# Patient Record
Sex: Female | Born: 2000 | Race: Black or African American | Hispanic: No | Marital: Single | Smoking: Never smoker
Health system: Southern US, Community
[De-identification: ages and names within clinical notes are randomized; demographics above are authoritative.]

## PROBLEM LIST (undated history)

## (undated) ENCOUNTER — Inpatient Hospital Stay (HOSPITAL_COMMUNITY): Payer: Self-pay

## (undated) DIAGNOSIS — J45909 Unspecified asthma, uncomplicated: Secondary | ICD-10-CM

## (undated) DIAGNOSIS — L709 Acne, unspecified: Secondary | ICD-10-CM

## (undated) DIAGNOSIS — A749 Chlamydial infection, unspecified: Secondary | ICD-10-CM

## (undated) DIAGNOSIS — Z8751 Personal history of pre-term labor: Secondary | ICD-10-CM

## (undated) HISTORY — PX: NO PAST SURGERIES: SHX2092

---
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
## 2018-01-01 ENCOUNTER — Other Ambulatory Visit: Payer: Self-pay | Admitting: Obstetrics and Gynecology

## 2018-01-01 DIAGNOSIS — A749 Chlamydial infection, unspecified: Secondary | ICD-10-CM

## 2018-01-01 DIAGNOSIS — O98811 Other maternal infectious and parasitic diseases complicating pregnancy, first trimester: Principal | ICD-10-CM

## 2018-01-01 DIAGNOSIS — O98819 Other maternal infectious and parasitic diseases complicating pregnancy, unspecified trimester: Secondary | ICD-10-CM

## 2018-01-01 DIAGNOSIS — O2341 Unspecified infection of urinary tract in pregnancy, first trimester: Principal | ICD-10-CM

## 2018-01-01 DIAGNOSIS — B951 Streptococcus, group B, as the cause of diseases classified elsewhere: Secondary | ICD-10-CM

## 2018-01-01 LAB — CULTURE, OB URINE
Culture: >=100000 — AB
SPECIAL REQUESTS: NORMAL

## 2018-01-01 LAB — GC/CHLAMYDIA PROBE AMP (~~LOC~~) NOT AT ARMC
Chlamydia: POSITIVE — AB
Neisseria Gonorrhea: NEGATIVE

## 2018-01-01 MED ORDER — CEPHALEXIN 500 MG PO CAPS: 500.0000 mg | ORAL_CAPSULE | Freq: Two times a day (BID) | ORAL | 0 refills | Status: DC

## 2018-01-01 MED ORDER — AZITHROMYCIN 250 MG PO TABS: 1000.0000 mg | ORAL_TABLET | Freq: Once | ORAL | 0 refills | Status: AC

## 2018-01-01 NOTE — Progress Notes (Signed)
+   chlamydia Patient called and notified Rx for Azithromycin 1000 mg PO Partner needs treatment, no sex X 7 days once he is treated Will need TOC   Rasch, Harolyn RutherfordJennifer I, NP 01/01/2018 4:53 PM

## 2018-01-01 NOTE — Progress Notes (Signed)
+   urine culture + GBS in urine  Rx: Keflex 500 mg PO x 7 days called to pharmacy Patient called and made aware.    Venia Carbonasch, Aarin Bluett I, NP 01/01/2018 2:05 PM

## 2018-01-02 ENCOUNTER — Telehealth: Payer: Self-pay | Admitting: Student

## 2018-01-02 DIAGNOSIS — B951 Streptococcus, group B, as the cause of diseases classified elsewhere: Secondary | ICD-10-CM

## 2018-01-02 DIAGNOSIS — O2341 Unspecified infection of urinary tract in pregnancy, first trimester: Principal | ICD-10-CM

## 2018-01-02 LAB — HSV CULTURE AND TYPING

## 2018-01-02 MED ORDER — AMOXICILLIN 500 MG PO CAPS: 500.0000 mg | ORAL_CAPSULE | Freq: Three times a day (TID) | ORAL | 0 refills | Status: AC

## 2018-01-02 NOTE — Telephone Encounter (Signed)
Verified by name & DOB.  Notified of change in abx. Needs amoxicillin for GBS UTI. Will d/c keflex.   Judeth HornLawrence, Markesha Hannig, NP

## 2018-01-22 ENCOUNTER — Encounter (HOSPITAL_COMMUNITY): Payer: Self-pay

## 2018-01-22 ENCOUNTER — Other Ambulatory Visit: Payer: Self-pay

## 2018-01-22 ENCOUNTER — Inpatient Hospital Stay (HOSPITAL_COMMUNITY)
Admission: AD | Admit: 2018-01-22 | Discharge: 2018-01-22 | Disposition: A | Discharge status: Home / Self Care | Payer: Medicaid Other | Source: Ambulatory Visit | Attending: Obstetrics & Gynecology | Admitting: Obstetrics & Gynecology

## 2018-01-22 DIAGNOSIS — O210 Mild hyperemesis gravidarum: Secondary | ICD-10-CM | POA: Insufficient documentation

## 2018-01-22 DIAGNOSIS — R109 Unspecified abdominal pain: Secondary | ICD-10-CM | POA: Diagnosis not present

## 2018-01-22 DIAGNOSIS — O219 Vomiting of pregnancy, unspecified: Secondary | ICD-10-CM

## 2018-01-22 DIAGNOSIS — Z3A11 11 weeks gestation of pregnancy: Secondary | ICD-10-CM | POA: Diagnosis not present

## 2018-01-22 DIAGNOSIS — O26891 Other specified pregnancy related conditions, first trimester: Secondary | ICD-10-CM

## 2018-01-22 LAB — COMPREHENSIVE METABOLIC PANEL
ALK PHOS: 73 U/L (ref 38–126)
ALT: 15 U/L (ref 0–44)
AST: 17 U/L (ref 15–41)
Albumin: 3.9 g/dL (ref 3.5–5.0)
Anion gap: 10 (ref 5–15)
BUN: 9 mg/dL (ref 6–20)
CALCIUM: 9.3 mg/dL (ref 8.9–10.3)
CHLORIDE: 100 mmol/L (ref 98–111)
CO2: 22 mmol/L (ref 22–32)
CREATININE: 0.55 mg/dL (ref 0.44–1.00)
GFR calc non Af Amer: >60 mL/min (ref >60)
Glucose, Bld: 104 mg/dL — ABNORMAL HIGH (ref 70–99)
Potassium: 3.4 mmol/L — ABNORMAL LOW (ref 3.5–5.1)
Sodium: 132 mmol/L — ABNORMAL LOW (ref 135–145)
Total Bilirubin: 1.8 mg/dL — ABNORMAL HIGH (ref 0.3–1.2)
Total Protein: 7.2 g/dL (ref 6.5–8.1)

## 2018-01-22 LAB — URINALYSIS, ROUTINE W REFLEX MICROSCOPIC
Bilirubin Urine: NEGATIVE
Glucose, UA: NEGATIVE mg/dL
HGB URINE DIPSTICK: NEGATIVE
Ketones, ur: 80 mg/dL — AB
NITRITE: NEGATIVE
PROTEIN: 30 mg/dL — AB
SPECIFIC GRAVITY, URINE: 1.035 — AB (ref 1.005–1.030)
pH: 5 (ref 5.0–8.0)

## 2018-01-22 MED ORDER — ONDANSETRON HCL 4 MG/2ML IJ SOLN
4.0000 mg | Freq: Once | INTRAMUSCULAR | Status: AC
  Administered 2018-01-22: 4 mg via INTRAVENOUS
  Filled 2018-01-22: qty 2

## 2018-01-22 MED ORDER — LACTATED RINGERS IV BOLUS
1000.0000 mL | Freq: Once | INTRAVENOUS | Status: AC
  Administered 2018-01-22: 1000 mL via INTRAVENOUS

## 2018-01-22 MED ORDER — ONDANSETRON 4 MG PO TBDP: 4.0000 mg | ORAL_TABLET | Freq: Three times a day (TID) | ORAL | 0 refills | Status: DC | PRN

## 2018-01-22 NOTE — MAU Provider Note (Signed)
History     CSN: [id number]  Arrival date and time: 01/22/18 1408   First Provider Initiated Contact with Patient 01/22/18 1513      Chief Complaint  Patient presents with  . Abdominal Pain  . Headache  . Emesis  . Nausea  . Dizziness   HPI  [name] is a 17 y.o. [id number] at 2539w1d who presents to MAU today with complaint of N/V and lower abdominal pain. The patient has been having these issues for many weeks. She was given Reglan which she has ran out of and didn't feel was working well anyway. She denies vaginal bleeding, UTI symptoms, diarrhea or fever. She has had constipation. She rates her pain at 5/10 and has not taken anything for pain.   OB History    Gravida  4   Para  2   Term  1   Preterm  1   AB  1   Living  2     SAB  1   TAB      Ectopic      Multiple  0   Live Births  2           Past Medical History:  Diagnosis Date  . Acne   . Asthma    when younger    Past Surgical History:  Procedure Laterality Date  . NO PAST SURGERIES      Family History  Problem Relation Age of Onset  . Anxiety disorder Mother   . Asthma Sister   . Asthma Brother     Social History   Tobacco Use  . Smoking status: Never Smoker  . Smokeless tobacco: Never Used  Substance Use Topics  . Alcohol use: No  . Drug use: No    Allergies: No Known Allergies  No medications prior to admission.    Review of Systems  Constitutional: Negative for fever.  Gastrointestinal: Positive for abdominal pain, constipation, nausea and vomiting. Negative for diarrhea.  Genitourinary: Negative for dysuria, frequency, urgency, vaginal bleeding and vaginal discharge.   Physical Exam   Blood pressure 106/72, pulse 87, temperature 98.3 F (36.8 C), temperature source Oral, resp. rate 16, weight 49.4 kg, last menstrual period 10/10/2017, SpO2 100 %, unknown if currently breastfeeding.  Physical Exam  Nursing note and vitals reviewed. Constitutional: She is  oriented to person, place, and time. She appears well-developed and well-nourished. No distress.  HENT:  Head: Normocephalic and atraumatic.  Cardiovascular: Tachycardia present.  Respiratory: Effort normal.  GI: Soft. She exhibits no distension and no mass. There is no tenderness. There is no rebound and no guarding.  Neurological: She is alert and oriented to person, place, and time.  Skin: Skin is warm and dry. No erythema.  Psychiatric: She has a normal mood and affect.   Results for orders placed or performed during the hospital encounter of 01/22/18 (from the past 24 hour(s))  Urinalysis, Routine w reflex microscopic     Status: Abnormal   Collection Time: 01/22/18  3:23 PM  Result Value Ref Range   Color, Urine AMBER (A) YELLOW   APPearance HAZY (A) CLEAR   Specific Gravity, Urine 1.035 (H) 1.005 - 1.030   pH 5.0 5.0 - 8.0   Glucose, UA NEGATIVE NEGATIVE mg/dL   Hgb urine dipstick NEGATIVE NEGATIVE   Bilirubin Urine NEGATIVE NEGATIVE   Ketones, ur 80 (A) NEGATIVE mg/dL   Protein, ur 30 (A) NEGATIVE mg/dL   Nitrite NEGATIVE NEGATIVE   Leukocytes, UA MODERATE (  A) NEGATIVE   RBC / HPF 0-5 0 - 5 RBC/hpf   WBC, UA 21-50 0 - 5 WBC/hpf   Bacteria, UA RARE (A) NONE SEEN   Squamous Epithelial / LPF 11-20 0 - 5   Mucus PRESENT   Comprehensive metabolic panel     Status: Abnormal   Collection Time: 01/22/18  4:11 PM  Result Value Ref Range   Sodium 132 (L) 135 - 145 mmol/L   Potassium 3.4 (L) 3.5 - 5.1 mmol/L   Chloride 100 98 - 111 mmol/L   CO2 22 22 - 32 mmol/L   Glucose, Bld 104 (H) 70 - 99 mg/dL   BUN 9 6 - 20 mg/dL   Creatinine, Ser 4.69 0.44 - 1.00 mg/dL   Calcium 9.3 8.9 - 62.9 mg/dL   Total Protein 7.2 6.5 - 8.1 g/dL   Albumin 3.9 3.5 - 5.0 g/dL   AST 17 15 - 41 U/L   ALT 15 0 - 44 U/L   Alkaline Phosphatase 73 38 - 126 U/L   Total Bilirubin 1.8 (H) 0.3 - 1.2 mg/dL   GFR calc non Af Amer >60 >60 mL/min   GFR calc Af Amer >60 >60 mL/min   Anion gap 10 5 - 15      MAU Course  Procedures Pt informed that the ultrasound is considered a limited OB ultrasound and is not intended to be a complete ultrasound exam.  Patient also informed that the ultrasound is not being completed with the intent of assessing for fetal or placental anomalies or any pelvic abnormalities.  Explained that the purpose of today's ultrasound is to assess for  viability.  Patient acknowledges the purpose of the exam and the limitations of the study.  +FHR noted. Normal rate. +FM.   MDM Unable to obtain FHR with Doppler. Bedside US performed.  UA today with significant dehydration IV LR bolus ordered with 4 mg Zofran and CMP today Patient requesting Zofran. Discussed risk vs benefits of Zofran in the first trimester and patient would prefer to have Zofran.  Patient reports resolution of nausea and no further emesis while in MAU. Able to tolerate PO while in MAU.  Assessment and Plan  A: SIUP at [redacted]w[redacted]d Nausea and vomiting in pregnancy prior to [redacted] weeks gestation  P: Discharge home Rx for Zofran sent to patient's pharmacy Diet for N/V in pregnancy included on AVS Patient advised to follow-up with [facility] as planned to start prenatal care  Patient may return to MAU as needed or if her condition were to change or worsen  Vonzella Nipple, PA-C 01/22/2018, 6:31 PM

## 2018-01-22 NOTE — Discharge Instructions (Signed)
Eating Plan for Hyperemesis Gravidarum °Hyperemesis gravidarum is a severe form of morning sickness. Because this condition causes severe nausea and vomiting, it can lead to dehydration, malnutrition, and weight loss. One way to lessen the symptoms of nausea and vomiting is to follow the eating plan for hyperemesis gravidarum. It is often used along with prescribed medicines to control your symptoms. °What can I do to relieve my symptoms? °Listen to your body. Everyone is different and has different preferences. Find what works best for you. Take any of the following actions that are helpful to you: °· Eat and drink slowly. °· Eat 5-6 small meals daily instead of 3 large meals. °· Eat crackers before you get out of bed in the morning. °· Try having a snack in the middle of the night. °· Starchy foods are usually tolerated well. Examples include cereal, toast, bread, potatoes, pasta, rice, and pretzels. °· Ginger may help with nausea. Add ¼ tsp ground ginger to hot tea or choose ginger tea. °· Try drinking 100% fruit juice or an electrolyte drink. An electrolyte drink contains sodium, potassium, and chloride. °· Continue to take your prenatal vitamins as told by your health care provider. If you are having trouble taking your prenatal vitamins, talk with your health care provider about different options. °· Include at least 1 serving of protein with your meals and snacks. Protein options include meats or poultry, beans, nuts, eggs, and yogurt. Try eating a protein-rich snack before bed. Examples of these snacks include cheese and crackers or half of a peanut butter or turkey sandwich. °· Consider eliminating foods that trigger your symptoms. These may include spicy foods, coffee, high-fat foods, very sweet foods, and acidic foods. °· Try meals that have more protein combined with bland, salty, lower-fat, and dry foods, such as nuts, seeds, pretzels, crackers, and cereal. °· Talk with your healthcare provider about  starting a supplement of vitamin B6. °· Have fluids that are cold, clear, and carbonated or sour. Examples include lemonade, ginger ale, lemon-lime soda, ice water, and sparkling water. °· Try lemon or mint tea. °· Try brushing your teeth or using a mouth rinse after meals. ° °What should I avoid to reduce my symptoms? °Avoiding some of the following things may help reduce your symptoms. °· Foods with strong smells. Try eating meals in well-ventilated areas that are free of odors. °· Drinking water or other beverages with meals. Try not to drink anything during the 30 minutes before and after your meals. °· Drinking more than 1 cup of fluid at a time. Sometimes using a straw helps. °· Fried or high-fat foods, such as butter and cream sauces. °· Spicy foods. °· Skipping meals as best as you can. Nausea can be more intense on an empty stomach. If you cannot tolerate food at that time, do not force it. Try sucking on ice chips or other frozen items, and make up for missed calories later. °· Lying down within 2 hours after eating. °· Environmental triggers. These may include smoky rooms, closed spaces, rooms with strong smells, warm or humid places, overly loud and noisy rooms, and rooms with motion or flickering lights. °· Quick and sudden changes in your movement. ° °This information is not intended to replace advice given to you by your health care provider. Make sure you discuss any questions you have with your health care provider. °Document Released: 02/16/2007 Document Revised: 12/19/2015 Document Reviewed: 11/20/2015 °Elsevier Interactive Patient Education © 2018 [employer] ° °

## 2018-01-22 NOTE — MAU Note (Signed)
For the past 2 days, hasn't been able to keep anything down.  Having pain in lower abd.  Has been constipated. Has been dizzy and bad headaches for the past wk.  Doesn't see dr until next week

## 2018-01-27 ENCOUNTER — Ambulatory Visit (INDEPENDENT_AMBULATORY_CARE_PROVIDER_SITE_OTHER): Payer: Medicaid Other | Admitting: Obstetrics

## 2018-01-27 ENCOUNTER — Encounter: Payer: Self-pay | Admitting: Obstetrics

## 2018-01-27 VITALS — BP 107/74 | HR 103 | Ht 61.0 in | Wt 111.3 lb

## 2018-01-27 DIAGNOSIS — B951 Streptococcus, group B, as the cause of diseases classified elsewhere: Secondary | ICD-10-CM

## 2018-01-27 DIAGNOSIS — O98811 Other maternal infectious and parasitic diseases complicating pregnancy, first trimester: Secondary | ICD-10-CM | POA: Diagnosis not present

## 2018-01-27 DIAGNOSIS — Z3490 Encounter for supervision of normal pregnancy, unspecified, unspecified trimester: Secondary | ICD-10-CM | POA: Diagnosis not present

## 2018-01-27 DIAGNOSIS — O99820 Streptococcus B carrier state complicating pregnancy: Secondary | ICD-10-CM

## 2018-01-27 DIAGNOSIS — A749 Chlamydial infection, unspecified: Secondary | ICD-10-CM

## 2018-01-27 DIAGNOSIS — O2341 Unspecified infection of urinary tract in pregnancy, first trimester: Secondary | ICD-10-CM | POA: Diagnosis not present

## 2018-01-27 DIAGNOSIS — Z3481 Encounter for supervision of other normal pregnancy, first trimester: Secondary | ICD-10-CM

## 2018-01-27 MED ORDER — CEFIXIME 400 MG PO CAPS: 400.0000 mg | ORAL_CAPSULE | Freq: Once | ORAL | 0 refills | Status: AC

## 2018-01-27 MED ORDER — AZITHROMYCIN 500 MG PO TABS: 1000.0000 mg | ORAL_TABLET | Freq: Once | ORAL | 0 refills | Status: AC

## 2018-01-27 NOTE — Progress Notes (Signed)
Pt states pregnancy was not planned recent. Recently tested + for CT took Rx as directed however pt states she did throw up Rx.  Pt was 135 lbs before pregnancy.

## 2018-01-27 NOTE — Progress Notes (Signed)
Subjective:    Amanda Davies is being seen today for her first obstetrical visit.  This is not a planned pregnancy. She is at [redacted]w[redacted]d gestation. Her obstetrical history is significant for none. Relationship with FOB: significant other, not living together. Patient does intend to breast feed. Pregnancy history fully reviewed.  The information documented in the HPI was reviewed and verified.  Menstrual History: OB History    Gravida  4   Para  2   Term  1   Preterm  1   AB  1   Living  2     SAB  1   TAB      Ectopic      Multiple  0   Live Births  2          Patient's last menstrual period was 10/10/2017.    Past Medical History:  Diagnosis Date  . Acne   . Asthma    when younger    Past Surgical History:  Procedure Laterality Date  . NO PAST SURGERIES       (Not in a hospital admission) No Known Allergies  Social History   Tobacco Use  . Smoking status: Never Smoker  . Smokeless tobacco: Never Used  Substance Use Topics  . Alcohol use: No    Family History  Problem Relation Age of Onset  . Anxiety disorder Mother   . Asthma Sister   . Asthma Brother      Review of Systems Constitutional: negative for weight loss Gastrointestinal: negative for vomiting Genitourinary:negative for genital lesions and vaginal discharge and dysuria Musculoskeletal:negative for back pain Behavioral/Psych: negative for abusive relationship, depression, illegal drug usage and tobacco use    Objective:    BP 107/74   Pulse (!) 103   Ht 5\' 1"  (1.549 m)   Wt 111 lb 4.8 oz (50.5 kg)   LMP 10/10/2017 Comment: Early June  BMI 21.03 kg/m  General Appearance:    Alert, cooperative, no distress, appears stated age  Head:    Normocephalic, without obvious abnormality, atraumatic  Eyes:    PERRL, conjunctiva/corneas clear, EOM's intact, fundi    benign, both eyes  Ears:    Normal TM's and external ear canals, both ears  Nose:   Nares normal, septum midline, mucosa  normal, no drainage    or sinus tenderness  Throat:   Lips, mucosa, and tongue normal; teeth and gums normal  Neck:   Supple, symmetrical, trachea midline, no adenopathy;    thyroid:  no enlargement/tenderness/nodules; no carotid   bruit or JVD  Back:     Symmetric, no curvature, ROM normal, no CVA tenderness  Lungs:     Clear to auscultation bilaterally, respirations unlabored  Chest Wall:    No tenderness or deformity   Heart:    Regular rate and rhythm, S1 and S2 normal, no murmur, rub   or gallop  Breast Exam:    No tenderness, masses, or nipple abnormality  Abdomen:     Soft, non-tender, bowel sounds active all four quadrants,    no masses, no organomegaly  Genitalia:    Normal female without lesion, discharge or tenderness  Extremities:   Extremities normal, atraumatic, no cyanosis or edema  Pulses:   2+ and symmetric all extremities  Skin:   Skin color, texture, turgor normal, no rashes or lesions  Lymph nodes:   Cervical, supraclavicular, and axillary nodes normal  Neurologic:   CNII-XII intact, normal strength, sensation and reflexes  throughout      Lab Review Urine pregnancy test Labs reviewed yes Radiologic studies reviewed yes  Assessment:    Pregnancy at [redacted]w[redacted]d weeks    Plan:     1. Encounter for supervision of normal pregnancy, antepartum, unspecified gravidity Rx: - Obstetric Panel, Including HIV - Culture, OB Urine - SMN1 COPY NUMBER ANALYSIS (SMA Carrier Screen) - Cystic Fibrosis Mutation 97 - Hemoglobinopathy evaluation - Genetic Screening  2. GBS (group b Streptococcus) UTI complicating pregnancy, first trimester - treated.  Treat in labor.  3. Chlamydia infection affecting pregnancy in first trimester Rx: - azithromycin (ZITHROMAX) 500 MG tablet; Take 2 tablets (1,000 mg total) by mouth once for 1 dose.  Dispense: 2 tablet; Refill: 0 - cefixime (SUPRAX) 400 MG CAPS capsule; Take 1 capsule (400 mg total) by mouth once for 1 dose.  Dispense: 1  capsule; Refill: 0  Prenatal vitamins.  Counseling provided regarding continued use of seat belts, cessation of alcohol consumption, smoking or use of illicit drugs; infection precautions i.e., influenza/TDAP immunizations, toxoplasmosis,CMV, parvovirus, listeria and varicella; workplace safety, exercise during pregnancy; routine dental care, safe medications, sexual activity, hot tubs, saunas, pools, travel, caffeine use, fish and methlymercury, potential toxins, hair treatments, varicose veins Weight gain recommendations per IOM guidelines reviewed: underweight/BMI< 18.5--> gain 28 - 40 lbs; normal weight/BMI 18.5 - 24.9--> gain 25 - 35 lbs; overweight/BMI 25 - 29.9--> gain 15 - 25 lbs; obese/BMI >30->gain  11 - 20 lbs Problem list reviewed and updated. FIRST/CF mutation testing/NIPT/QUAD SCREEN/fragile X/Ashkenazi Jewish population testing/Spinal muscular atrophy discussed: requested. Role of ultrasound in pregnancy discussed; fetal survey: requested. Amniocentesis discussed: not indicated.  Meds ordered this encounter  Medications  . azithromycin (ZITHROMAX) 500 MG tablet    Sig: Take 2 tablets (1,000 mg total) by mouth once for 1 dose.    Dispense:  2 tablet    Refill:  0  . cefixime (SUPRAX) 400 MG CAPS capsule    Sig: Take 1 capsule (400 mg total) by mouth once for 1 dose.    Dispense:  1 capsule    Refill:  0   Orders Placed This Encounter  Procedures  . Culture, OB Urine  . Obstetric Panel, Including HIV  . SMN1 COPY NUMBER ANALYSIS (SMA Carrier Screen)  . Cystic Fibrosis Mutation 97  . Hemoglobinopathy evaluation  . Genetic Screening    [facility]    Follow up in 4 weeks. 50% of 20 min visit spent on counseling and coordination of care.     Brock Bad MD

## 2018-01-29 LAB — CULTURE, OB URINE

## 2018-01-29 LAB — URINE CULTURE, OB REFLEX

## 2018-01-30 LAB — HEMOGLOBINOPATHY EVALUATION
HEMOGLOBIN A2 QUANTITATION: 1.9 % (ref 1.8–3.2)
HGB A: 98.1 % (ref 96.4–98.8)
HGB C: 0 %
HGB S: 0 %
HGB VARIANT: 0 %
Hemoglobin F Quantitation: 0 % (ref 0.0–2.0)

## 2018-01-30 LAB — OBSTETRIC PANEL, INCLUDING HIV
ANTIBODY SCREEN: NEGATIVE
BASOS: 0 %
Basophils Absolute: 0 10*3/uL (ref 0.0–0.2)
EOS (ABSOLUTE): 0.1 10*3/uL (ref 0.0–0.4)
EOS: 1 %
HEMATOCRIT: 42.5 % (ref 34.0–46.6)
HEP B S AG: NEGATIVE
HIV SCREEN 4TH GENERATION: NONREACTIVE
Hemoglobin: 13.7 g/dL (ref 11.1–15.9)
IMMATURE GRANS (ABS): 0 10*3/uL (ref 0.0–0.1)
Immature Granulocytes: 1 %
LYMPHS: 30 %
Lymphocytes Absolute: 1.8 10*3/uL (ref 0.7–3.1)
MCH: 25 pg — ABNORMAL LOW (ref 26.6–33.0)
MCHC: 32.2 g/dL (ref 31.5–35.7)
MCV: 77 fL — AB (ref 79–97)
MONOCYTES: 8 %
Monocytes Absolute: 0.5 10*3/uL (ref 0.1–0.9)
NEUTROS ABS: 3.6 10*3/uL (ref 1.4–7.0)
Neutrophils: 60 %
Platelets: 231 10*3/uL (ref 150–450)
RBC: 5.49 x10E6/uL — ABNORMAL HIGH (ref 3.77–5.28)
RDW: 13.8 % (ref 12.3–15.4)
RPR Ser Ql: NONREACTIVE
RUBELLA: 17.7 {index} (ref >0.99)
Rh Factor: POSITIVE
WBC: 6 10*3/uL (ref 3.4–10.8)

## 2018-02-01 ENCOUNTER — Telehealth: Payer: Self-pay

## 2018-02-01 NOTE — Telephone Encounter (Signed)
S/w patient and advised that [facility] results were not back yet.

## 2018-02-02 ENCOUNTER — Telehealth: Payer: Self-pay | Admitting: *Deleted

## 2018-02-02 NOTE — Telephone Encounter (Signed)
Pt called to office stating she was having some cramping. Would like call back.  Return call to pt. Pt states that she has been having some cramping, have ate quite of bit of pineapple and is concerned it may cause her to have SAB. Pt made aware that some mild cramping in pregnancy is normal. Pt advised that if she has ate a large amount of fruit it may cause upset stomach and increase bowel movements. Pt made aware that if she is having any thing other than mild cramps and/or bleeding she should be seen at [facility] for eval. Pt denies severe pain/bleeding at this time.

## 2018-02-03 LAB — CYSTIC FIBROSIS MUTATION 97: Interpretation: NOT DETECTED

## 2018-02-05 LAB — SMN1 COPY NUMBER ANALYSIS (SMA CARRIER SCREENING)

## 2018-02-24 ENCOUNTER — Encounter: Payer: Medicaid Other | Admitting: Obstetrics

## 2018-03-09 ENCOUNTER — Encounter: Payer: Self-pay | Admitting: Obstetrics

## 2018-03-26 ENCOUNTER — Encounter: Payer: Self-pay | Admitting: Obstetrics

## 2018-03-26 ENCOUNTER — Ambulatory Visit (INDEPENDENT_AMBULATORY_CARE_PROVIDER_SITE_OTHER): Payer: Medicaid Other | Admitting: Obstetrics

## 2018-03-26 VITALS — BP 112/71 | HR 94 | Wt 114.0 lb

## 2018-03-26 DIAGNOSIS — O0992 Supervision of high risk pregnancy, unspecified, second trimester: Secondary | ICD-10-CM

## 2018-03-26 DIAGNOSIS — O09892 Supervision of other high risk pregnancies, second trimester: Secondary | ICD-10-CM

## 2018-03-26 DIAGNOSIS — A749 Chlamydial infection, unspecified: Secondary | ICD-10-CM | POA: Diagnosis not present

## 2018-03-26 DIAGNOSIS — O09212 Supervision of pregnancy with history of pre-term labor, second trimester: Secondary | ICD-10-CM | POA: Diagnosis not present

## 2018-03-26 DIAGNOSIS — O0990 Supervision of high risk pregnancy, unspecified, unspecified trimester: Secondary | ICD-10-CM

## 2018-03-26 DIAGNOSIS — Z3A20 20 weeks gestation of pregnancy: Secondary | ICD-10-CM | POA: Diagnosis not present

## 2018-03-26 DIAGNOSIS — O09899 Supervision of other high risk pregnancies, unspecified trimester: Secondary | ICD-10-CM

## 2018-03-26 DIAGNOSIS — O98812 Other maternal infectious and parasitic diseases complicating pregnancy, second trimester: Secondary | ICD-10-CM | POA: Diagnosis not present

## 2018-03-26 DIAGNOSIS — O09219 Supervision of pregnancy with history of pre-term labor, unspecified trimester: Secondary | ICD-10-CM

## 2018-03-26 MED ORDER — CEFIXIME 400 MG PO CAPS: 400.0000 mg | ORAL_CAPSULE | Freq: Once | ORAL | 0 refills | Status: AC

## 2018-03-26 MED ORDER — AZITHROMYCIN 500 MG PO TABS: 1000.0000 mg | ORAL_TABLET | Freq: Once | ORAL | 0 refills | Status: AC

## 2018-03-26 NOTE — Progress Notes (Signed)
Pt has not been able to hold Rx for CT  Needs medication sent again .  Pt advised not to take Rx on empty stomach.  Pt states she fell recently after running fell on FM.

## 2018-03-26 NOTE — Progress Notes (Signed)
Subjective:  Amanda Davies is a 17 y.o. [id number] at 7748w1d being seen today for ongoing prenatal care.  She is currently monitored for the following issues for this high-risk pregnancy and has H/O preterm delivery, currently pregnant; Supervision of high risk pregnancy, antepartum; GBS (group b Streptococcus) UTI complicating pregnancy, first trimester; Chlamydia infection affecting pregnancy; and Supervision of normal pregnancy on their problem list.  Patient reports heartburn.   . Vag. Bleeding: None.  Movement: Present. Denies leaking of fluid.   The following portions of the patient's history were reviewed and updated as appropriate: allergies, current medications, past family history, past medical history, past social history, past surgical history and problem list. Problem list updated.  Objective:   Vitals:   03/26/18 1110  BP: 112/71  Pulse: 94  Weight: 114 lb (51.7 kg)    Fetal Status:     Movement: Present     General:  Alert, oriented and cooperative. Patient is in no acute distress.  Skin: Skin is warm and dry. No rash noted.   Cardiovascular: Normal heart rate noted  Respiratory: Normal respiratory effort, no problems with respiration noted  Abdomen: Soft, gravid, appropriate for gestational age. Pain/Pressure: Present     Pelvic:  Cervical exam deferred        Extremities: Normal range of motion.  Edema: None  Mental Status: Normal mood and affect. Normal behavior. Normal judgment and thought content.   Urinalysis:      Assessment and Plan:  Pregnancy: [id number] at 6948w1d  1. Supervision of high risk pregnancy, antepartum Rx: - US MFM OB COMP + 14 WK; Future  2. H/O preterm delivery, currently pregnant  3. Short interval between pregnancies affecting pregnancy, antepartum  4. Chlamydia infection affecting pregnancy in second trimester, antepartum Rx: - azithromycin (ZITHROMAX) 500 MG tablet; Take 2 tablets (1,000 mg total) by mouth once for 1 dose.  Dispense: 2  tablet; Refill: 0 - cefixime (SUPRAX) 400 MG CAPS capsule; Take 1 capsule (400 mg total) by mouth once for 1 dose.  Dispense: 1 capsule; Refill: 0   Preterm labor symptoms and general obstetric precautions including but not limited to vaginal bleeding, contractions, leaking of fluid and fetal movement were reviewed in detail with the patient. Please refer to After Visit Summary for other counseling recommendations.  Return in about 4 weeks (around 04/23/2018) for ROB.   Brock BadHarper, Charles A, MD

## 2018-03-29 ENCOUNTER — Other Ambulatory Visit: Payer: Self-pay | Admitting: Obstetrics

## 2018-03-29 ENCOUNTER — Telehealth: Payer: Self-pay

## 2018-03-29 ENCOUNTER — Encounter (HOSPITAL_COMMUNITY): Payer: Self-pay

## 2018-03-29 DIAGNOSIS — O98813 Other maternal infectious and parasitic diseases complicating pregnancy, third trimester: Principal | ICD-10-CM

## 2018-03-29 DIAGNOSIS — A749 Chlamydial infection, unspecified: Secondary | ICD-10-CM

## 2018-03-29 NOTE — Telephone Encounter (Signed)
Pt states she is nauseous and vomiting, refilled rx for zofran per dr. Clearance Cootsharper

## 2018-03-30 ENCOUNTER — Other Ambulatory Visit: Payer: Self-pay | Admitting: Obstetrics

## 2018-03-30 DIAGNOSIS — A749 Chlamydial infection, unspecified: Secondary | ICD-10-CM

## 2018-03-30 IMAGING — US US MFM OB FOLLOW-UP
1 series · 14 of 28 positions shown · non-contrast
Comparison: none

[Series 1: us mfm ob follow-up · 73 acquisitions, 14 frames shown]
[im 3/73]
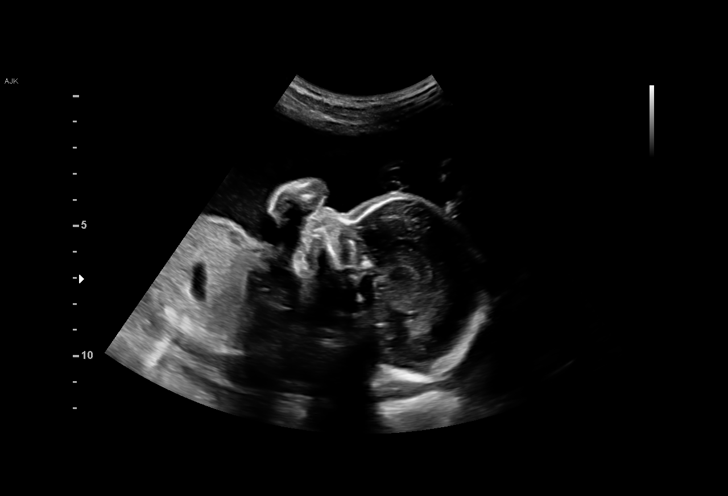
[im 9/73]
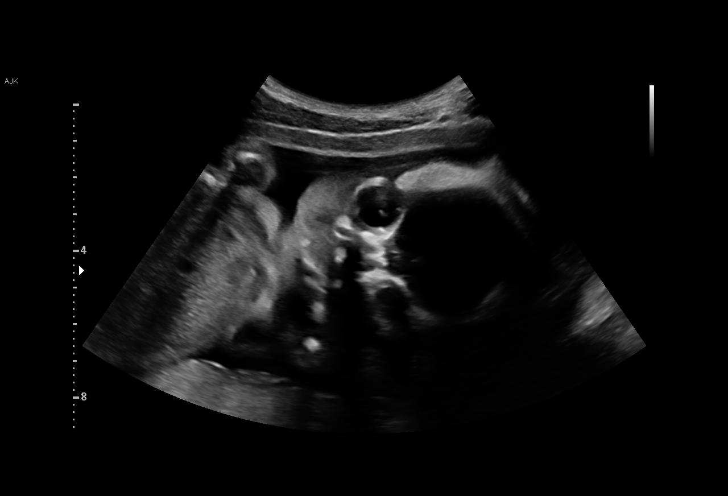
[im 14/73]
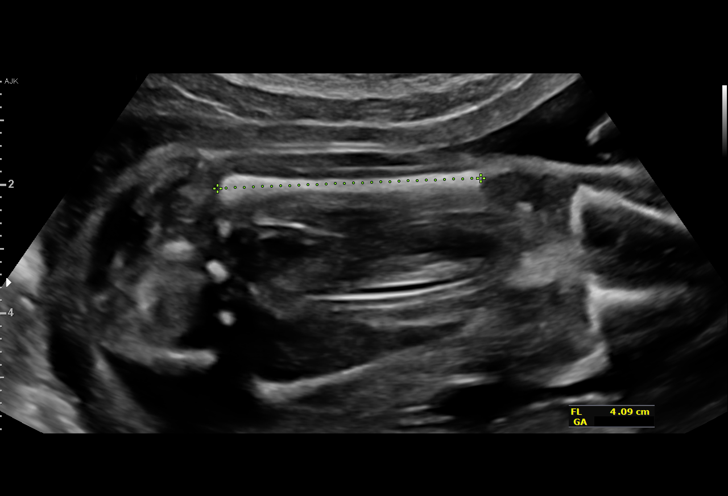
[im 19/73]
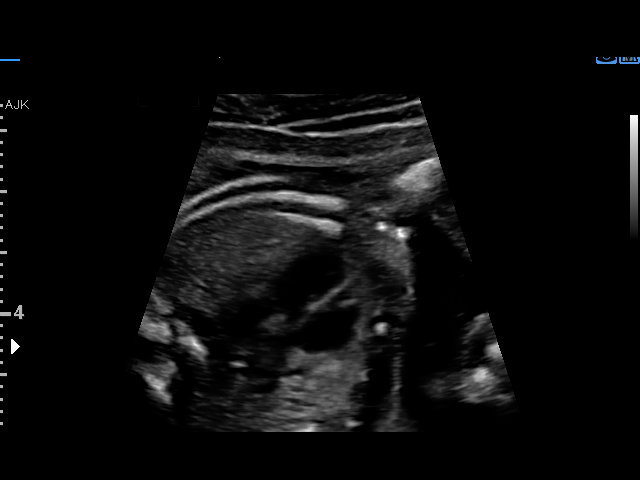
[im 25/73]
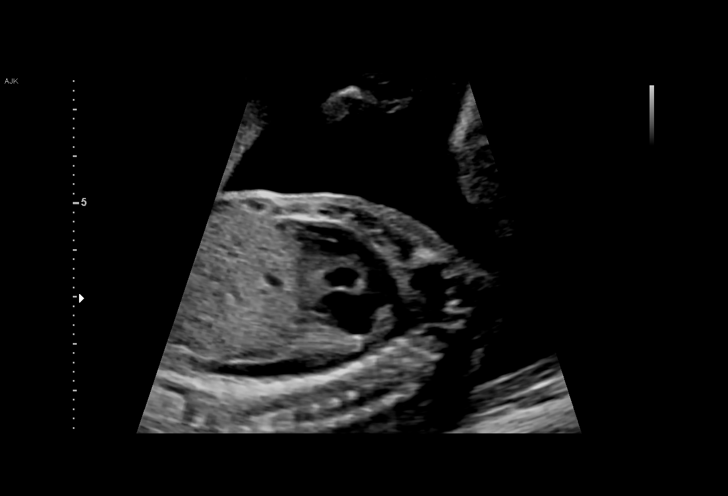
[im 30/73]
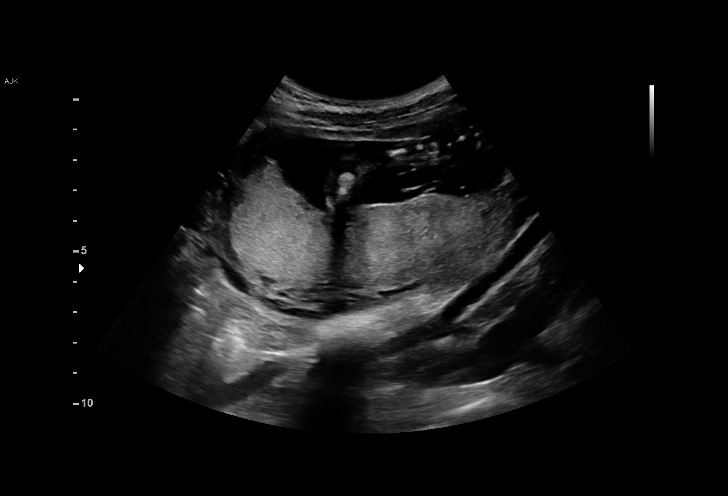
[im 35/73]
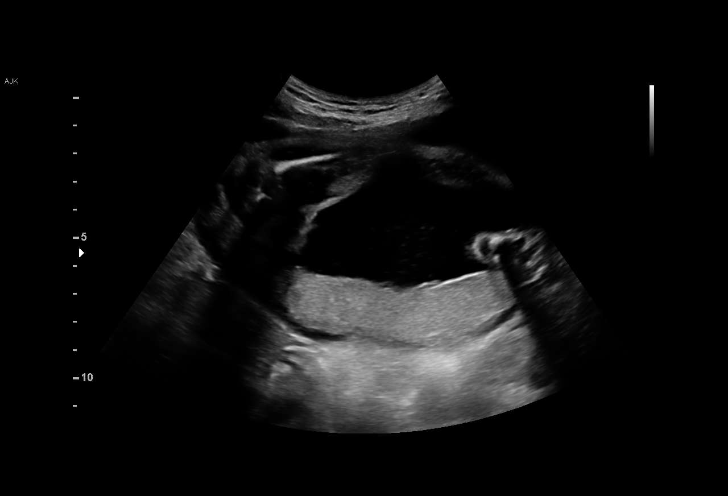
[im 41/73]
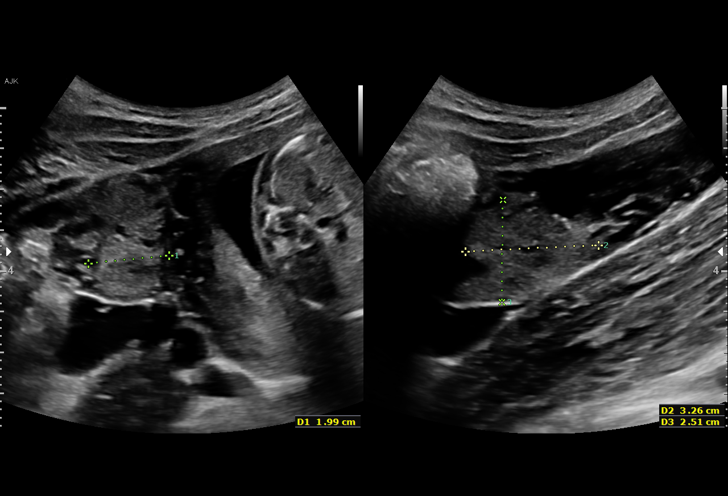
[im 46/73]
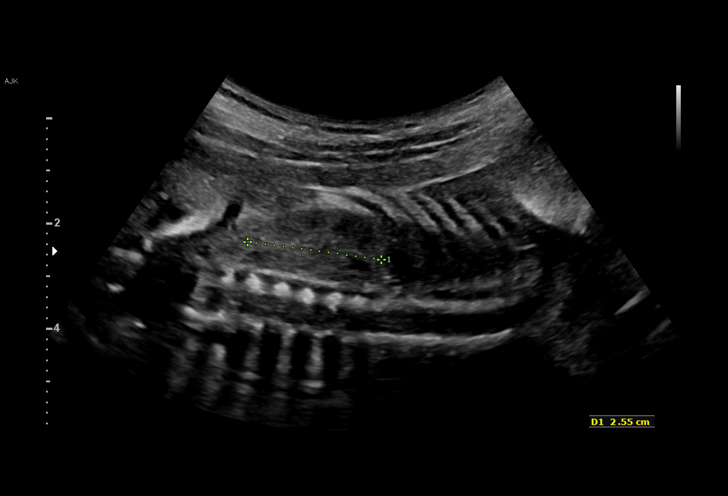
[im 51/73]
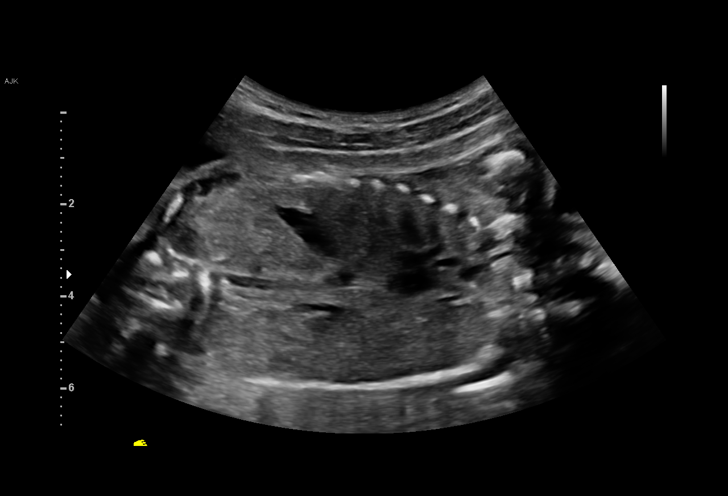
[im 57/73]
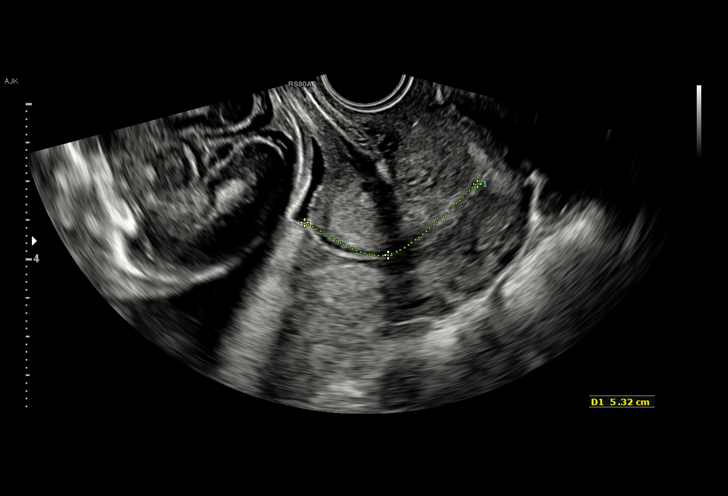
[im 62/73]
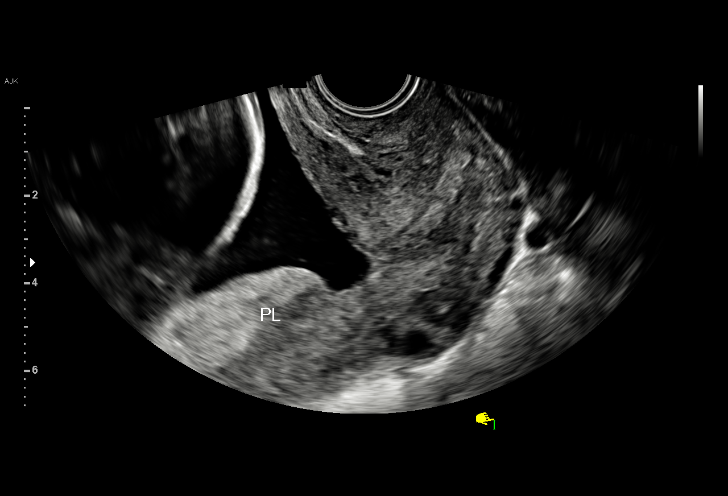
[im 67/73]
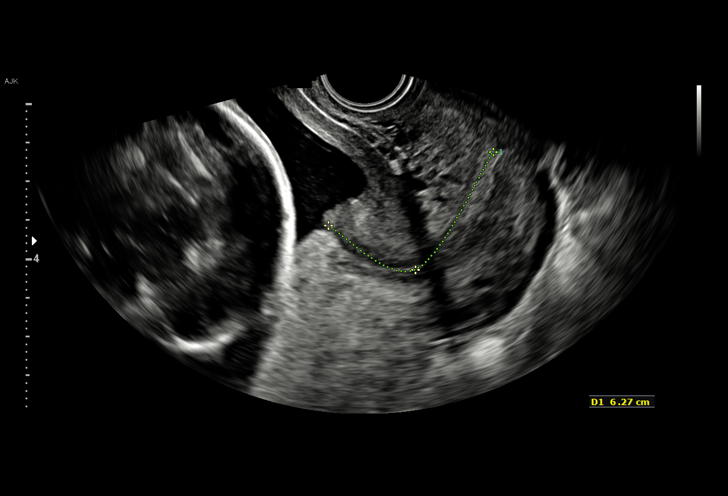
[im 73/73]
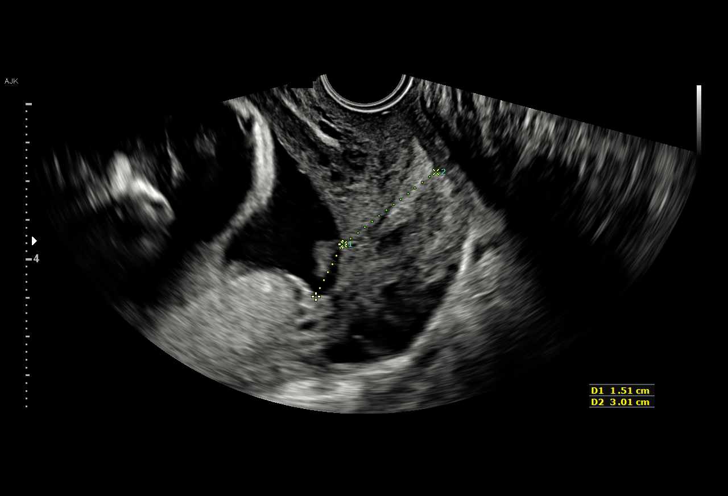

[14 of 28 positions shown; findings below may reference images not displayed]

Road [HOSPITAL]

1  [facility]              [id number]      [id number]     [id number]
2  JESUS ABRAHAM KROOS              [id number]      [id number]     [id number]
Indications

23 weeks gestation of pregnancy
Poor obstetric history: Previous preterm
delivery, antepartum (35 weeks, pt on 17p)
Encounter for other antenatal screening
follow-up
Encounter for cervical length
OB History

Gravidity:    3         Term:   0        Prem:   1        SAB:   0
TOP:          0       Ectopic:  0        Living: 1
Fetal Evaluation

Num Of Fetuses:     1
Fetal Heart         145
Rate(bpm):
Cardiac Activity:   Observed
Presentation:       Cephalic
Placenta:           Posterior, low-lying, 1.4 cm from int os
P. Cord Insertion:  Visualized, central

Amniotic Fluid
AFI FV:      Subjectively within normal limits

Largest Pocket(cm)
5.97
Biometry
BPD:      58.6  mm     G. Age:  24w 0d         60  %    CI:        75.85   %   70 - 86
FL/HC:      19.2   %   18.7 -
HC:      213.3  mm     G. Age:  23w 3d         27  %    HC/AC:      1.15       1.05 -
AC:      185.8  mm     G. Age:  23w 3d         36  %    FL/BPD:     70.0   %   71 - 87
FL:         41  mm     G. Age:  23w 2d         29  %    FL/AC:      22.1   %   20 - 24
CER:      25.8  mm     G. Age:  23w 6d         53  %
CM:        2.6  mm

Est. FW:     588  gm      1 lb 5 oz     49  %
Gestational Age

LMP:           23w 4d       Date:   07/27/16                 EDD:   05/03/17
U/S Today:     23w 4d                                        EDD:   05/03/17
Best:          23w 4d    Det. By:   LMP  (07/27/16)          EDD:   05/03/17
Anatomy

Cranium:               Appears normal         Aortic Arch:            Previously seen
Cavum:                 Appears normal         Ductal Arch:            Previously seen
Ventricles:            Appears normal         Diaphragm:              Appears normal
Choroid Plexus:        Previously seen        Stomach:                Appears normal, left
sided
Cerebellum:            Previously seen        Abdomen:                Appears normal
Posterior Fossa:       Appears normal         Abdominal Wall:         Previously seen
Nuchal Fold:           Not applicable (>20    Cord Vessels:           Previously seen
wks GA)
Face:                  Appears normal         Kidneys:                Appear normal
(orbits and profile)
Lips:                  Appears normal         Bladder:                Appears normal
Thoracic:              Appears normal         Spine:                  Previously seen
Heart:                 Appears normal         Upper Extremities:      Previously seen
(4CH, axis, and situs
RVOT:                  Appears normal         Lower Extremities:      Previously seen
LVOT:                  Appears normal

Other:  Fetus appears to be a female.Heels previously seen. Nasal bone
visualized.
Cervix Uterus Adnexa

Cervix
Length:           3.49  cm.
Normal appearance by transabdominal scan.

Uterus
No abnormality visualized.

Left Ovary
Within normal limits.

Right Ovary
Within normal limits.

Adnexa:       No abnormality visualized.
Impression

SIUP at 23+4 weeks
Normal interval anatomy; anatomic survey complete
Normal amniotic fluid volume
Appropriate interval growth with EFW at the 49th %tile
EV views of cervix: normal length without funneling; low-lying
posterior placenta
Recommendations

Follow-up ultrasound in 6 weeks to reassess placental
location

## 2018-03-30 MED ORDER — CEFIXIME 400 MG PO CAPS: 400.0000 mg | ORAL_CAPSULE | Freq: Once | ORAL | 0 refills | Status: AC

## 2018-04-06 ENCOUNTER — Ambulatory Visit (HOSPITAL_COMMUNITY)
Admission: RE | Admit: 2018-04-06 | Discharge: 2018-04-06 | Disposition: A | Discharge status: Home / Self Care | Payer: Medicaid Other | Source: Ambulatory Visit | Attending: Obstetrics | Admitting: Obstetrics

## 2018-04-06 DIAGNOSIS — Z3A21 21 weeks gestation of pregnancy: Secondary | ICD-10-CM

## 2018-04-06 DIAGNOSIS — Z363 Encounter for antenatal screening for malformations: Secondary | ICD-10-CM | POA: Diagnosis not present

## 2018-04-06 DIAGNOSIS — O0990 Supervision of high risk pregnancy, unspecified, unspecified trimester: Secondary | ICD-10-CM

## 2018-04-06 DIAGNOSIS — O09212 Supervision of pregnancy with history of pre-term labor, second trimester: Secondary | ICD-10-CM

## 2018-04-26 ENCOUNTER — Encounter: Payer: Medicaid Other | Admitting: Obstetrics

## 2018-04-30 DIAGNOSIS — H5213 Myopia, bilateral: Secondary | ICD-10-CM | POA: Diagnosis not present

## 2018-05-03 ENCOUNTER — Telehealth: Payer: Self-pay | Admitting: Obstetrics

## 2018-05-05 NOTE — L&D Delivery Note (Signed)
Delivery Note Amanda Davies is a 18 y.o. S8P1031 at [redacted]w[redacted]d admitted for SROM.  Labor course: uncomplicated ROM: 5h 65m with clear fluid  At 2016 a viable and healthy girl was delivered via spontaneous vaginal delivery (Presentation: cephalic; LOA).  Infant placed directly on mom's abdomen for bonding/skin-to-skin. Delayed cord clamping x , then cord clamped x 2, and cut by patient's mother. APGAR: 8,9 ; weight: pending at time of note.  40 units of pitocin diluted in 1000cc LR was infused rapidly IV per protocol. The placenta separated spontaneously and delivered via CCT and maternal pushing effort.  It was inspected and appears to be intact with a 3 VC.  Placenta/Cord with the following complications: none. Cord pH: n/a  Intrapartum complications:  None Anesthesia:  epidural Episiotomy: none Lacerations:  none Suture Repair: n/a Est. Blood Loss (mL): 50 Sponge and instrument count were correct x2.  Mom to postpartum.  Baby to Couplet care / Skin to Skin. Placenta to pathology. Plans to breast/bottlefeed Contraception: Depo Circ: n/a  Brand Males, SNM 07/09/2018 8:26 PM

## 2018-05-07 ENCOUNTER — Encounter: Payer: Self-pay | Admitting: Obstetrics

## 2018-05-12 DIAGNOSIS — H52223 Regular astigmatism, bilateral: Secondary | ICD-10-CM | POA: Diagnosis not present

## 2018-05-12 DIAGNOSIS — H5213 Myopia, bilateral: Secondary | ICD-10-CM | POA: Diagnosis not present

## 2018-06-11 ENCOUNTER — Telehealth: Payer: Self-pay

## 2018-06-11 ENCOUNTER — Other Ambulatory Visit: Payer: Medicaid Other

## 2018-06-11 ENCOUNTER — Encounter: Payer: Medicaid Other | Admitting: Obstetrics

## 2018-06-11 NOTE — Telephone Encounter (Signed)
Pt called wanting a refill for zofran and zithromax. I called pharmacy and confirmed that pt had picked up 2 rounds of zithromax that were previously sent. I advised patient that I was unable to send any refills at this time since she has not been seen since 11/19, she has no showed the past couple appointments. Pt verbalized understanding.

## 2018-06-15 ENCOUNTER — Telehealth: Payer: Self-pay | Admitting: Licensed Clinical Social Worker

## 2018-06-15 NOTE — Telephone Encounter (Signed)
Called pt for appt reminder. No answer. Left voicemail.

## 2018-06-17 ENCOUNTER — Ambulatory Visit (INDEPENDENT_AMBULATORY_CARE_PROVIDER_SITE_OTHER): Payer: Medicaid Other | Admitting: Obstetrics

## 2018-06-17 ENCOUNTER — Encounter: Payer: Self-pay | Admitting: Obstetrics

## 2018-06-17 VITALS — BP 101/60 | HR 97 | Wt 125.0 lb

## 2018-06-17 DIAGNOSIS — Z3A32 32 weeks gestation of pregnancy: Secondary | ICD-10-CM

## 2018-06-17 DIAGNOSIS — O98813 Other maternal infectious and parasitic diseases complicating pregnancy, third trimester: Secondary | ICD-10-CM

## 2018-06-17 DIAGNOSIS — A749 Chlamydial infection, unspecified: Secondary | ICD-10-CM

## 2018-06-17 DIAGNOSIS — Z3490 Encounter for supervision of normal pregnancy, unspecified, unspecified trimester: Secondary | ICD-10-CM

## 2018-06-17 DIAGNOSIS — O219 Vomiting of pregnancy, unspecified: Secondary | ICD-10-CM | POA: Diagnosis not present

## 2018-06-17 MED ORDER — ONDANSETRON 4 MG PO TBDP: ORAL_TABLET | ORAL | 2 refills | Status: DC

## 2018-06-17 MED ORDER — AZITHROMYCIN 500 MG PO TABS: ORAL_TABLET | ORAL | 0 refills | Status: DC

## 2018-06-17 NOTE — Progress Notes (Signed)
Subjective:  Amanda Davies is a 18 y.o. [id number] at [redacted]w[redacted]d being seen today for ongoing prenatal care.  She is currently monitored for the following issues for this high-risk pregnancy and has H/O preterm delivery, currently pregnant; Supervision of high risk pregnancy, antepartum; GBS (group b Streptococcus) UTI complicating pregnancy, first trimester; Chlamydia infection affecting pregnancy; and Supervision of normal pregnancy on their problem list.  Patient reports nausea.  Contractions: Not present. Vag. Bleeding: None.  Movement: Present. Denies leaking of fluid.   The following portions of the patient's history were reviewed and updated as appropriate: allergies, current medications, past family history, past medical history, past social history, past surgical history and problem list. Problem list updated.  Objective:   Vitals:   06/17/18 0926  BP: 101/60  Pulse: 97  Weight: 125 lb (56.7 kg)    Fetal Status:     Movement: Present     General:  Alert, oriented and cooperative. Patient is in no acute distress.  Skin: Skin is warm and dry. No rash noted.   Cardiovascular: Normal heart rate noted  Respiratory: Normal respiratory effort, no problems with respiration noted  Abdomen: Soft, gravid, appropriate for gestational age. Pain/Pressure: Present     Pelvic:  Cervical exam deferred        Extremities: Normal range of motion.  Edema: None  Mental Status: Normal mood and affect. Normal behavior. Normal judgment and thought content.   Urinalysis:      Assessment and Plan:  Pregnancy: [id number] at [redacted]w[redacted]d  1. Encounter for supervision of normal pregnancy, antepartum, unspecified gravidity  2. Chlamydia infection complicating pregnancy in third trimester Rx: - azithromycin (ZITHROMAX) 500 MG tablet; TAKE 2 TABLETS BY MOUTH ONCE  Dispense: 2 tablet; Refill: 0  3. Nausea and vomiting during pregnancy Rx: - ondansetron (ZOFRAN-ODT) 4 MG disintegrating tablet; TAKE 1 TABLET BY MOUTH  EVERY 8 HOURS AS NEEDED FOR NAUSEA OR VOMITING  Dispense: 30 tablet; Refill: 2  Term labor symptoms and general obstetric precautions including but not limited to vaginal bleeding, contractions, leaking of fluid and fetal movement were reviewed in detail with the patient. Please refer to After Visit Summary for other counseling recommendations.  Return in about 2 weeks (around 07/01/2018) for ROB.   Brock Bad, MD

## 2018-06-17 NOTE — Progress Notes (Signed)
Needs Refill on Nausea Medication. Pt states she has not been able to keep zithromax 250 mg down. Pt has been issues with getting Nausea Medication from the pharmacy.  Pt will not be able to to 2 hr GT today pt  She had snickers candy bar.

## 2018-06-25 ENCOUNTER — Observation Stay (HOSPITAL_COMMUNITY)
Admission: AD | Admit: 2018-06-25 | Discharge: 2018-06-28 | Disposition: A | Discharge status: Home / Self Care | Payer: Medicaid Other | Attending: Obstetrics & Gynecology | Admitting: Obstetrics & Gynecology

## 2018-06-25 ENCOUNTER — Encounter (HOSPITAL_COMMUNITY): Payer: Self-pay

## 2018-06-25 ENCOUNTER — Other Ambulatory Visit: Payer: Self-pay

## 2018-06-25 DIAGNOSIS — Z3A33 33 weeks gestation of pregnancy: Secondary | ICD-10-CM | POA: Diagnosis not present

## 2018-06-25 DIAGNOSIS — A749 Chlamydial infection, unspecified: Secondary | ICD-10-CM | POA: Diagnosis not present

## 2018-06-25 DIAGNOSIS — Z79899 Other long term (current) drug therapy: Secondary | ICD-10-CM | POA: Diagnosis not present

## 2018-06-25 DIAGNOSIS — O2341 Unspecified infection of urinary tract in pregnancy, first trimester: Secondary | ICD-10-CM

## 2018-06-25 DIAGNOSIS — Z3490 Encounter for supervision of normal pregnancy, unspecified, unspecified trimester: Secondary | ICD-10-CM

## 2018-06-25 DIAGNOSIS — N939 Abnormal uterine and vaginal bleeding, unspecified: Secondary | ICD-10-CM

## 2018-06-25 DIAGNOSIS — O4700 False labor before 37 completed weeks of gestation, unspecified trimester: Secondary | ICD-10-CM | POA: Diagnosis present

## 2018-06-25 DIAGNOSIS — O98811 Other maternal infectious and parasitic diseases complicating pregnancy, first trimester: Secondary | ICD-10-CM

## 2018-06-25 DIAGNOSIS — B951 Streptococcus, group B, as the cause of diseases classified elsewhere: Secondary | ICD-10-CM

## 2018-06-25 HISTORY — DX: Chlamydial infection, unspecified: A74.9

## 2018-06-25 LAB — URINALYSIS, MICROSCOPIC (REFLEX)

## 2018-06-25 LAB — URINALYSIS, ROUTINE W REFLEX MICROSCOPIC
Bilirubin Urine: NEGATIVE
Glucose, UA: NEGATIVE mg/dL
KETONES UR: 15 mg/dL — AB
Nitrite: NEGATIVE
Protein, ur: NEGATIVE mg/dL
Specific Gravity, Urine: 1.02 (ref 1.005–1.030)
pH: 7.5 (ref 5.0–8.0)

## 2018-06-25 LAB — CBC
HCT: 35.7 % — ABNORMAL LOW (ref 36.0–46.0)
Hemoglobin: 11.3 g/dL — ABNORMAL LOW (ref 12.0–15.0)
MCH: 24.6 pg — AB (ref 26.0–34.0)
MCHC: 31.7 g/dL (ref 30.0–36.0)
MCV: 77.8 fL — ABNORMAL LOW (ref 80.0–100.0)
Platelets: 199 10*3/uL (ref 150–400)
RBC: 4.59 MIL/uL (ref 3.87–5.11)
RDW: 14.1 % (ref 11.5–15.5)
WBC: 8.7 10*3/uL (ref 4.0–10.5)
nRBC: 0 % (ref 0.0–0.2)

## 2018-06-25 LAB — RAPID URINE DRUG SCREEN, HOSP PERFORMED
Amphetamines: NOT DETECTED
Barbiturates: NOT DETECTED
Benzodiazepines: NOT DETECTED
Cocaine: NOT DETECTED
OPIATES: NOT DETECTED
Tetrahydrocannabinol: NOT DETECTED

## 2018-06-25 LAB — TYPE AND SCREEN
ABO/RH(D): O POS
Antibody Screen: NEGATIVE

## 2018-06-25 LAB — CREATININE, SERUM
Creatinine, Ser: 0.49 mg/dL (ref 0.44–1.00)
GFR calc Af Amer: >60 mL/min (ref >60)
GFR calc non Af Amer: >60 mL/min (ref >60)

## 2018-06-25 MED ORDER — BETAMETHASONE SOD PHOS & ACET 6 (3-3) MG/ML IJ SUSP
12.0000 mg | INTRAMUSCULAR | Status: AC
  Administered 2018-06-25 – 2018-06-26 (×2): 12 mg via INTRAMUSCULAR
  Filled 2018-06-25 (×2): qty 2

## 2018-06-25 MED ORDER — DOCUSATE SODIUM 100 MG PO CAPS
100.0000 mg | ORAL_CAPSULE | Freq: Every day | ORAL | Status: DC
  Administered 2018-06-26 – 2018-06-28 (×3): 100 mg via ORAL
  Filled 2018-06-25 (×3): qty 1

## 2018-06-25 MED ORDER — ENOXAPARIN SODIUM 40 MG/0.4ML ~~LOC~~ SOLN
40.0000 mg | SUBCUTANEOUS | Status: DC
  Administered 2018-06-25 – 2018-06-27 (×3): 40 mg via SUBCUTANEOUS
  Filled 2018-06-25 (×3): qty 0.4

## 2018-06-25 MED ORDER — AZITHROMYCIN 250 MG PO TABS
1000.0000 mg | ORAL_TABLET | Freq: Once | ORAL | Status: AC
  Administered 2018-06-25: 1000 mg via ORAL
  Filled 2018-06-25: qty 4

## 2018-06-25 MED ORDER — CALCIUM CARBONATE ANTACID 500 MG PO CHEW: 2.0000 | CHEWABLE_TABLET | ORAL | Status: DC | PRN

## 2018-06-25 MED ORDER — PRENATAL MULTIVITAMIN CH
1.0000 | ORAL_TABLET | Freq: Every day | ORAL | Status: DC
  Administered 2018-06-26 – 2018-06-27 (×2): 1 via ORAL
  Filled 2018-06-25 (×3): qty 1

## 2018-06-25 MED ORDER — PENICILLIN G 3 MILLION UNITS IVPB - SIMPLE MED
3.0000 10*6.[IU] | INTRAVENOUS | Status: DC
  Administered 2018-06-26 (×5): 3 10*6.[IU] via INTRAVENOUS
  Filled 2018-06-25: qty 100
  Filled 2018-06-25: qty 3
  Filled 2018-06-25: qty 100
  Filled 2018-06-25: qty 3
  Filled 2018-06-25 (×2): qty 100
  Filled 2018-06-25: qty 3
  Filled 2018-06-25: qty 100

## 2018-06-25 MED ORDER — MAGNESIUM SULFATE BOLUS VIA INFUSION
5.0000 g | Freq: Once | INTRAVENOUS | Status: AC
  Administered 2018-06-25: 5 g via INTRAVENOUS
  Filled 2018-06-25: qty 500

## 2018-06-25 MED ORDER — ACETAMINOPHEN 325 MG PO TABS: 650.0000 mg | ORAL_TABLET | ORAL | Status: DC | PRN

## 2018-06-25 MED ORDER — MAGNESIUM SULFATE 40 G IN LACTATED RINGERS - SIMPLE
2.0000 g/h | INTRAVENOUS | Status: AC
  Administered 2018-06-26: 2 g/h via INTRAVENOUS
  Filled 2018-06-25 (×2): qty 500

## 2018-06-25 MED ORDER — SODIUM CHLORIDE 0.9 % IV SOLN
5.0000 10*6.[IU] | Freq: Once | INTRAVENOUS | Status: AC
  Administered 2018-06-25: 5 10*6.[IU] via INTRAVENOUS
  Filled 2018-06-25: qty 5

## 2018-06-25 MED ORDER — ONDANSETRON 4 MG PO TBDP
4.0000 mg | ORAL_TABLET | Freq: Once | ORAL | Status: DC
  Filled 2018-06-25: qty 1

## 2018-06-25 MED ORDER — LACTATED RINGERS IV SOLN
INTRAVENOUS | Status: AC
  Administered 2018-06-25 – 2018-06-26 (×3): via INTRAVENOUS

## 2018-06-25 NOTE — H&P (Signed)
OBSTETRIC ADMISSION HISTORY AND PHYSICAL  Amanda Davies is a 18 y.o. female [phone] with IUP at 3568w1d by L/8 presenting for contractions, bloody show. States had intercourse last night for the first time in awhile. Woke up early this morning with contractions, went to bathroom and had some blood streaking the tissue when she wiped. Contractions eased off for a bit earlier today than have picked back up in the last few hours. Feels like they are every 5 minutes, lasting about 30 seconds. Has noticed more bloody, mucous-like discharge this afternoon. Had some in underwear when went to bathroom.   Reports fetal movement. Denies abnormal discharge, leakage of fluids.   She received her prenatal [facility] at [facility] - though has had limited care (3 visits)  Ultrasounds . 8w0: viability U/S . 21w5: anatomy U/S, anterior placenta, normal interval growth, no abnormalities noted  Prenatal History/Complications: . Limited prenatal care . History of preterm delivery at 35 weeks  . GBS bacteriuria . Chlamydia - diagnosed in November 2019, had not yet taken treatment as of 06/25/18 - vomited first dose and not repeated because lost prescription   Past Medical History: Past Medical History:  Diagnosis Date  . Acne   . Asthma    when younger  . Chlamydia     Past Surgical History: Past Surgical History:  Procedure Laterality Date  . NO PAST SURGERIES      Obstetrical History: OB History    Gravida  4   Para  2   Term  1   Preterm  1   AB  1   Living  2     SAB  1   TAB      Ectopic      Multiple  0   Live Births  2           Social History: Social History   Socioeconomic History  . Marital status: Single    Spouse name: Not on file  . Number of children: 2  . Years of education: 3712  . Highest education level: High school graduate  Occupational History  . Not on file  Social Needs  . Financial resource strain: Not on file  . Food insecurity:    Worry: Not on file     Inability: Not on file  . Transportation needs:    Medical: Not on file    Non-medical: Not on file  Tobacco Use  . Smoking status: Never Smoker  . Smokeless tobacco: Never Used  Substance and Sexual Activity  . Alcohol use: No  . Drug use: No  . Sexual activity: Not Currently    Partners: Male    Birth control/protection: None  Lifestyle  . Physical activity:    Days per week: Not on file    Minutes per session: Not on file  . Stress: Not on file  Relationships  . Social connections:    Talks on phone: Not on file    Gets together: Not on file    Attends religious service: Not on file    Active member of club or organization: Not on file    Attends meetings of clubs or organizations: Not on file    Relationship status: Not on file  Other Topics Concern  . Not on file  Social History Narrative  . Not on file    Family History: Family History  Problem Relation Age of Onset  . Anxiety disorder Mother   . Asthma Sister   . Asthma Brother  Allergies: No Known Allergies  Medications Prior to Admission  Medication Sig Dispense Refill Last Dose  . azithromycin (ZITHROMAX) 250 MG tablet TK 4 TS PO ONCE FOR 1 DOSE  0 Not Taking  . azithromycin (ZITHROMAX) 500 MG tablet TAKE 2 TABLETS BY MOUTH ONCE 2 tablet 0   . metoCLOPramide (REGLAN) 10 MG tablet Take 1 tablet (10 mg total) by mouth 4 (four) times daily -  before meals and at bedtime. (Patient not taking: Reported on 01/27/2018) 30 tablet 0 Not Taking  . ondansetron (ZOFRAN-ODT) 4 MG disintegrating tablet TAKE 1 TABLET BY MOUTH EVERY 8 HOURS AS NEEDED FOR NAUSEA OR VOMITING 30 tablet 0   . ondansetron (ZOFRAN-ODT) 4 MG disintegrating tablet TAKE 1 TABLET BY MOUTH EVERY 8 HOURS AS NEEDED FOR NAUSEA OR VOMITING 30 tablet 2   . Prenatal MV-Min-FA-Omega-3 (PRENATAL GUMMIES/DHA & FA) 0.4-32.5 MG CHEW Chew 1 each by mouth daily. (Patient not taking: Reported on 01/27/2018) 30 tablet 1 Not Taking  . Prenatal Vit-Fe  Phos-FA-Omega (VITAFOL GUMMIES) 3.33-0.333-34.8 MG CHEW CHEW 3 TABLETS BY MOUTH AT BEDTIME (Patient not taking: Reported on 06/17/2018) 90 tablet 11 Not Taking  . terconazole (TERAZOL 7) 0.4 % vaginal cream Place 1 applicator vaginally at bedtime. (Patient not taking: Reported on 01/27/2018) 45 g 0 Not Taking     Review of Systems  All systems reviewed and negative except as stated in HPI  Blood pressure (!) 90/43, pulse 88, temperature 98.5 F (36.9 C), resp. rate 16, last menstrual period 10/10/2017, SpO2 98 %, unknown if currently breastfeeding. General appearance: alert, well-appearing, mildly uncomfortable during contractions Lungs: no respiratory distress Heart: regular rate  Abdomen: soft, non-tender; gravid Pelvic: deferred  Extremities: no LE edema  Presentation: cephalic by BSUS, sutures  Fetal monitoring: 130s/mod/+a/-d Uterine activity: irregular Dilation: 4 Effacement (%): 70 Station: -1 Exam by:: L Earlene Plater DO  Prenatal labs: ABO, Rh: --/--/O POS (02/21 1821) Antibody: NEG (02/21 1821) Rubella: 17.70 (09/25 1638) RPR: Non Reactive (09/25 1638)  HBsAg: Negative (09/25 1638)  HIV: Non Reactive (09/25 1638)  GBS:   bacteriuria Glucola: not done Genetic screening:  Low risk NIPS (female)  Prenatal Transfer Tool  Maternal Diabetes: no screening Genetic Screening: Normal Maternal Ultrasounds/Referrals: Normal Fetal Ultrasounds or other Referrals:  None Maternal Substance Abuse:  Unknown, UDS pending at time of admission Significant Maternal Medications:  None Significant Maternal Lab Results: None  Results for orders placed or performed during the hospital encounter of 06/25/18 (from the past 24 hour(s))  Urinalysis, Routine w reflex microscopic   Collection Time: 06/25/18  5:51 PM  Result Value Ref Range   Color, Urine YELLOW YELLOW   APPearance CLEAR CLEAR   Specific Gravity, Urine 1.020 1.005 - 1.030   pH 7.5 5.0 - 8.0   Glucose, UA NEGATIVE NEGATIVE mg/dL    Hgb urine dipstick SMALL (A) NEGATIVE   Bilirubin Urine NEGATIVE NEGATIVE   Ketones, ur 15 (A) NEGATIVE mg/dL   Protein, ur NEGATIVE NEGATIVE mg/dL   Nitrite NEGATIVE NEGATIVE   Leukocytes,Ua SMALL (A) NEGATIVE  Urinalysis, Microscopic (reflex)   Collection Time: 06/25/18  5:51 PM  Result Value Ref Range   RBC / HPF 11-20 0 - 5 RBC/hpf   WBC, UA 11-20 0 - 5 WBC/hpf   Bacteria, UA FEW (A) NONE SEEN   Squamous Epithelial / LPF 11-20 0 - 5  Urine rapid drug screen (hosp performed)   Collection Time: 06/25/18  5:51 PM  Result Value Ref Range   Opiates  NONE DETECTED NONE DETECTED   Cocaine NONE DETECTED NONE DETECTED   Benzodiazepines NONE DETECTED NONE DETECTED   Amphetamines NONE DETECTED NONE DETECTED   Tetrahydrocannabinol NONE DETECTED NONE DETECTED   Barbiturates NONE DETECTED NONE DETECTED  CBC   Collection Time: 06/25/18  6:21 PM  Result Value Ref Range   WBC 8.7 4.0 - 10.5 K/uL   RBC 4.59 3.87 - 5.11 MIL/uL   Hemoglobin 11.3 (L) 12.0 - 15.0 g/dL   HCT 41.4 (L) 23.9 - 53.2 %   MCV 77.8 (L) 80.0 - 100.0 fL   MCH 24.6 (L) 26.0 - 34.0 pg   MCHC 31.7 30.0 - 36.0 g/dL   RDW 02.3 34.3 - 56.8 %   Platelets 199 150 - 400 K/uL   nRBC 0.0 0.0 - 0.2 %  Type and screen [facility]    Collection Time: 06/25/18  6:21 PM  Result Value Ref Range   ABO/RH(D) O POS    Antibody Screen NEG    Sample Expiration      06/28/2018 Performed at [facility], [address] [place], [place] [address]     Patient Active Problem List   Diagnosis Date Noted  . Supervision of normal pregnancy 01/27/2018  . GBS (group b Streptococcus) UTI complicating pregnancy, first trimester 01/01/2018  . Chlamydia infection affecting pregnancy 01/01/2018  . Supervision of high risk pregnancy, antepartum 11/27/2016  . H/O preterm delivery, currently pregnant 01/01/2016    Assessment/Plan:  Stanisha Schacher is a 18 y.o. [phone] at [redacted]w[redacted]d here for threatened preterm labor presenting  with contractions and bloody show, found to have BBOW on exam.   Threatened Preterm Labor: Monitored in MAU for about 2 hours with no cervical change - 4/70/-1, still BBOW. No signs of active labor, so will admit to antepartum.  -- Mg++ started for neuroprotection/tocolysis  -- continue mIVF  Fetal Wellbeing: EFW 5lbs by Leopold's. Cephalic by sutures, confirmed by BSUS.  -- GBS (positive) - PCN -- category I - will do BID NST  -- BMZ x 2 q24hrs  -- NICU consulted - discussed with MD over the phone   Untreated Chlamydia: Azithromycin 1g given in MAU.   Cristal Deer. Earlene Plater, DO OB/GYN Fellow

## 2018-06-25 NOTE — MAU Note (Addendum)
Pt having ctx and bloody show. Feeling ctx every 2-3 minutes since this morning. No LOF +FM. Pain is 7/10. Just found her Rx for Chlamydia but has not taken it yet.

## 2018-06-26 DIAGNOSIS — Z3A33 33 weeks gestation of pregnancy: Secondary | ICD-10-CM | POA: Diagnosis not present

## 2018-06-26 DIAGNOSIS — O479 False labor, unspecified: Secondary | ICD-10-CM

## 2018-06-26 LAB — RPR: RPR Ser Ql: NONREACTIVE

## 2018-06-26 MED ORDER — NIFEDIPINE ER OSMOTIC RELEASE 30 MG PO TB24
30.0000 mg | ORAL_TABLET | Freq: Every day | ORAL | Status: DC
  Administered 2018-06-26 – 2018-06-28 (×3): 30 mg via ORAL
  Filled 2018-06-26 (×3): qty 1

## 2018-06-26 MED ORDER — ALUM & MAG HYDROXIDE-SIMETH 200-200-20 MG/5ML PO SUSP
30.0000 mL | ORAL | Status: DC | PRN
  Administered 2018-06-26: 30 mL via ORAL
  Filled 2018-06-26 (×2): qty 30

## 2018-06-26 MED ORDER — ONDANSETRON 4 MG PO TBDP
4.0000 mg | ORAL_TABLET | Freq: Four times a day (QID) | ORAL | Status: DC | PRN
  Administered 2018-06-26 – 2018-06-28 (×2): 4 mg via ORAL
  Filled 2018-06-26 (×4): qty 1

## 2018-06-26 NOTE — Progress Notes (Signed)
Patient ID: Amanda Davies, female   DOB: November 19, 2000, 18 y.o.   MRN: [id number] FACULTY PRACTICE ANTEPARTUM(COMPREHENSIVE) NOTE  Amanda Davies is a 18 y.o. [id number] with Estimated Date of Delivery: 08/12/18   By   [redacted]w[redacted]d  who is admitted for Preterm labor.    Fetal presentation is cephalic. Length of Stay:  0  Days  Date of admission:06/25/2018  Subjective: No contractions Patient reports the fetal movement as active. Patient reports uterine contraction  activity as none. Patient reports  vaginal bleeding as none. Patient describes fluid per vagina as None.  Vitals:  Blood pressure (!) 97/53, pulse 94, temperature 98.3 F (36.8 C), temperature source Oral, resp. rate 16, height 5\' 1"  (1.549 m), weight 54.8 kg, last menstrual period 10/10/2017, SpO2 98 %, unknown if currently breastfeeding. Vitals:   06/26/18 0359 06/26/18 0500 06/26/18 0600 06/26/18 0658  BP: (!) 97/53     Pulse: 94     Resp: 15 16 16 16   Temp: 98.3 F (36.8 C)     TempSrc: Oral     SpO2: 100% 99% 98% 98%  Weight:      Height:       Physical Examination:  General appearance - alert, well appearing, and in no distress Abdomen - soft, nontender, nondistended, no masses or organomegaly Fundal Height:  size equals dates Pelvic Exam:  examination not indicated Cervical Exam: Not evaluated. and found to be / / and fetal presentation is . Extremities: extremities normal, atraumatic, no cyanosis or edema with DTRs 2+ bilaterally Membranes:intact  Fetal Monitoring:  Reactive NST     Labs:  Results for orders placed or performed during the hospital encounter of 06/25/18 (from the past 24 hour(s))  Urinalysis, Routine w reflex microscopic   Collection Time: 06/25/18  5:51 PM  Result Value Ref Range   Color, Urine YELLOW YELLOW   APPearance CLEAR CLEAR   Specific Gravity, Urine 1.020 1.005 - 1.030   pH 7.5 5.0 - 8.0   Glucose, UA NEGATIVE NEGATIVE mg/dL   Hgb urine dipstick SMALL (A) NEGATIVE   Bilirubin Urine NEGATIVE  NEGATIVE   Ketones, ur 15 (A) NEGATIVE mg/dL   Protein, ur NEGATIVE NEGATIVE mg/dL   Nitrite NEGATIVE NEGATIVE   Leukocytes,Ua SMALL (A) NEGATIVE  Urinalysis, Microscopic (reflex)   Collection Time: 06/25/18  5:51 PM  Result Value Ref Range   RBC / HPF 11-20 0 - 5 RBC/hpf   WBC, UA 11-20 0 - 5 WBC/hpf   Bacteria, UA FEW (A) NONE SEEN   Squamous Epithelial / LPF 11-20 0 - 5  Urine rapid drug screen (hosp performed)   Collection Time: 06/25/18  5:51 PM  Result Value Ref Range   Opiates NONE DETECTED NONE DETECTED   Cocaine NONE DETECTED NONE DETECTED   Benzodiazepines NONE DETECTED NONE DETECTED   Amphetamines NONE DETECTED NONE DETECTED   Tetrahydrocannabinol NONE DETECTED NONE DETECTED   Barbiturates NONE DETECTED NONE DETECTED  CBC   Collection Time: 06/25/18  6:21 PM  Result Value Ref Range   WBC 8.7 4.0 - 10.5 K/uL   RBC 4.59 3.87 - 5.11 MIL/uL   Hemoglobin 11.3 (L) 12.0 - 15.0 g/dL   HCT 56.3 (L) 87.5 - 64.3 %   MCV 77.8 (L) 80.0 - 100.0 fL   MCH 24.6 (L) 26.0 - 34.0 pg   MCHC 31.7 30.0 - 36.0 g/dL   RDW 32.9 51.8 - 84.1 %   Platelets 199 150 - 400 K/uL   nRBC 0.0 0.0 - 0.2 %  RPR   Collection Time: 06/25/18  6:21 PM  Result Value Ref Range   RPR Ser Ql Non Reactive Non Reactive  Creatinine, serum   Collection Time: 06/25/18  6:21 PM  Result Value Ref Range   Creatinine, Ser 0.49 0.44 - 1.00 mg/dL   GFR calc non Af Amer >60 >60 mL/min   GFR calc Af Amer >60 >60 mL/min  Type and screen [facility] [place]   Collection Time: 06/25/18  6:21 PM  Result Value Ref Range   ABO/RH(D) O POS    Antibody Screen NEG    Sample Expiration      06/28/2018 Performed at [facility], [address], [place], [place] [address]     Imaging Studies:      Medications:  Scheduled . betamethasone acetate-betamethasone sodium phosphate  12 mg Intramuscular Q24 Hr x 2  . docusate sodium  100 mg Oral Daily  . enoxaparin (LOVENOX) injection  40 mg Subcutaneous  Q24H  . pencillin G potassium IV  3 Million Units Intravenous Q4H  . prenatal multivitamin  1 tablet Oral Q1200   I have reviewed the patient's current medications.  ASSESSMENT: [id number] [redacted]w[redacted]d Estimated Date of Delivery: 08/12/18  Patient Active Problem List   Diagnosis Date Noted  . Threatened preterm labor 06/25/2018  . Supervision of normal pregnancy 01/27/2018  . GBS (group b Streptococcus) UTI complicating pregnancy, first trimester 01/01/2018  . Chlamydia infection affecting pregnancy 01/01/2018  . Supervision of high risk pregnancy, antepartum 11/27/2016  . H/O preterm delivery, currently pregnant 01/01/2016    PLAN: >finish up magnesium this evening >2nd BMZ this evening >begin oral procardia today >consider discharge in am if stable  [employer] 06/26/2018,7:36 AM

## 2018-06-26 NOTE — Consult Note (Signed)
Asked by Dr Despina Hidden to provide counseling to Amanda Davies regarding outlook for preterm infant at 37 weeks.  Brief Hx: 33 weeks in PTL. She has limited PNC. Pregnancy is complicated by GBS bacteriuria and untreated Chlamydia. She is currently on BMZ, magnesium sulfate, and  Pen G. She previously delivered at 35 weeks, infant was in the NICU and required some gavage feedings.   I spoke to Amanda Vereb in her room and dicussed expectations at 33-34 weeks. I discussed common morbidities at this age range including resp insufficiency requiring O2, immature feeding ability requiring IV fluids and gavage feeding. I discussed temp support and LOS. She was attentive and pleasant through the conversation.  Thank you for this consult.  Lucillie Garfinkel MD Neonatologist

## 2018-06-27 LAB — ABO/RH: ABO/RH(D): O POS

## 2018-06-27 NOTE — Progress Notes (Signed)
Report call to Olegario Messier, [place]

## 2018-06-27 NOTE — Progress Notes (Signed)
Patient ID: Amanda Davies, female   DOB: [birth year], 18 y.o.   MRN: [id number] FACULTY PRACTICE ANTEPARTUM(COMPREHENSIVE) NOTE  Amanda Davies is a 18 y.o. [id number] at [redacted]w[redacted]d by best clinical estimate who is admitted for Preterm labor.   Fetal presentation is cephalic. Length of Stay:  0  Days  Subjective:  Patient reports the fetal movement as active. Patient reports uterine contraction  activity as rare. Patient reports  vaginal bleeding as scant staining. Patient describes fluid per vagina as None.  Vitals:  Blood pressure 95/61, pulse (!) 104, temperature 98 F (36.7 C), temperature source Oral, resp. rate 17, height 5\' 1"  (1.549 m), weight 54.8 kg, last menstrual period 10/10/2017, SpO2 100 %, unknown if currently breastfeeding. Physical Examination:  General appearance - alert, well appearing, and in no distress Heart - normal rate and regular rhythm Abdomen - soft, nontender, nondistended Fundal Height:  size equals dates Cervical Exam: Not evaluated.  Extremities: extremities normal, atraumatic, no cyanosis or edema  Membranes:intact  Fetal Monitoring:  Baseline: 120 bpm, Variability: Good {> 6 bpm), Accelerations: Reactive and Decelerations: Absent  Labs:  No results found for this or any previous visit (from the past 24 hour(s)).   Medications:  Scheduled . docusate sodium  100 mg Oral Daily  . enoxaparin (LOVENOX) injection  40 mg Subcutaneous Q24H  . NIFEdipine  30 mg Oral Daily  . prenatal multivitamin  1 tablet Oral Q1200   I have reviewed the patient's current medications.  ASSESSMENT: Patient Active Problem List   Diagnosis Date Noted  . Threatened preterm labor 06/25/2018  . Supervision of normal pregnancy 01/27/2018  . GBS (group b Streptococcus) UTI complicating pregnancy, first trimester 01/01/2018  . Chlamydia infection affecting pregnancy 01/01/2018  . Supervision of high risk pregnancy, antepartum 11/27/2016  . H/O preterm delivery, currently pregnant  01/01/2016    PLAN: Observe for PTL off Magnesium. She will be transported to [facility]  Scheryl Darter 06/27/2018,4:33 AM

## 2018-06-28 ENCOUNTER — Encounter (HOSPITAL_COMMUNITY): Payer: Self-pay | Admitting: *Deleted

## 2018-06-28 LAB — TYPE AND SCREEN
ABO/RH(D): O POS
Antibody Screen: NEGATIVE

## 2018-06-28 LAB — GC/CHLAMYDIA PROBE AMP (~~LOC~~) NOT AT ARMC
Chlamydia: NEGATIVE
NEISSERIA GONORRHEA: NEGATIVE

## 2018-06-28 MED ORDER — NIFEDIPINE ER 30 MG PO TB24: 30.0000 mg | ORAL_TABLET | Freq: Every day | ORAL | 1 refills | Status: DC

## 2018-06-28 NOTE — Progress Notes (Signed)
Pt discharged to home with mother and significant other.  Condition stable.  Pt stated that she would return to hospital if she began feeling contractions again or if she suspects that her water has broken.  She knows to attend her scheduled OB appointment at [facility] on Feb 27.  Pt to car via wheelchair with Dolphus Jenny, NT.  No equipment for home ordered at discharge.

## 2018-06-28 NOTE — Discharge Summary (Signed)
Physician Discharge Summary  Patient ID: Amanda Davies MRN: [id number] DOB/AGE: [birth year] 18 y.o.  Admit date: 06/25/2018 Discharge date: 06/28/2018  Admission Diagnoses: PTL  Discharge Diagnoses:  Active Problems:   Threatened preterm labor   Discharged Condition: stable  Hospital Course: admitted with cervical exam 4 cm and BBOW Underwent magnesium sulfate tocolysis and betamethasone therapy No uterine activity 48 hours off magnesium  Consults:   Significant Diagnostic Studies:   Treatments: magnesium and BMZ x 2  Discharge Exam: Blood pressure (!) 97/57, pulse 79, temperature 98.2 F (36.8 C), temperature source Oral, resp. rate 15, height 5\' 1"  (1.549 m), weight 54.8 kg, last menstrual period 10/10/2017, SpO2 99 %, unknown if currently breastfeeding. General appearance: alert, cooperative and no distress GI: soft, non-tender; bowel sounds normal; no masses,  no organomegaly  Disposition: Discharge disposition: 01-Home or Self Care       Discharge Instructions    Call MD for:   Complete by:  As directed    Rupture of membranes or regular contractions   Diet - low sodium heart healthy   Complete by:  As directed    Increase activity slowly   Complete by:  As directed    Lifting restrictions   Complete by:  As directed    No lifting more than 10 pounds   Sexual Activity Restrictions   Complete by:  As directed    No sex      Follow-up Information    [facility] Follow up on 07/02/2018.   Why:  ob appt Contact information: [address] [place] [address] [phone]          Signed: Lazaro Arms 06/28/2018, 7:43 AM

## 2018-06-28 NOTE — Discharge Instructions (Signed)
Premature Rupture and Preterm Premature Rupture of Membranes ° °A sac made up of membranes surrounds your baby in the womb (uterus). Rupture of membranes is when this sac breaks open. This is also known as your "water breaking." When this sac breaks before labor starts, it is called premature rupture of membranes (PROM). If this happens before 37 weeks of being pregnant, it is called preterm premature rupture of membranes (PPROM). PPROM is serious. It needs medical care right away. °What increases the risk of PPROM? °PPROM is more likely to happen in women who: °· Have an infection. °· Have had PPROM before. °· Have a cervix that is short. °· Have bleeding during the second or third trimester. °· Have a low BMI. This is a measure of body fat. °· Smoke. °· Use drugs. °· Have a low socioeconomic status. °What problems can be caused by PROM and PPROM? °This condition creates health dangers for the mother and the baby. These include: °· Giving birth to the baby too early (prematurely). °· Getting a serious infection of the placenta (chorioamnionitis). °· Having the placenta detach from the uterus early (placental abruption). °· Squeezing of the umbilical cord. °· Getting a serious infection after delivery. °What are the signs of PROM and PPROM? °· A sudden gush of fluid from the vagina. °· A slow leak of fluid from the vagina. °· Your underwear is wet. °What should I do if I think my water broke? °Call your doctor right away. You will need to go to the hospital to get checked right away. °What happens if I am told that I have PROM or PPROM? °You will have tests done at the hospital. °· If you have PROM, you may be given medicine to start labor (be induced). This may be done if you are not having contractions during the 24 hours after your water broke. °· If you have PPROM and are not having contractions, you may be given medicine to start labor. It will depend on how far along you are in your pregnancy. °If you have  PPROM: °· You and your baby will be watched closely to see if you have infections or other problems. °· You may be given: °? An antibiotic medicine. This can stop an infection from starting. °? A steroid medicine. This can help your baby's lungs develop faster. °? A medicine to help prevent cerebral palsy in your baby. °? A medicine to stop early labor (preterm labor). °· You may be told to stay in bed except to use the bathroom (bed rest). °· You may be given medicine to start labor. This may be done if there are problems with you or the baby. °Your treatment will depend on many factors. °Contact a doctor if: °· Your water breaks and you are not having contractions. °Get help right away if: °· Your water breaks before you are [redacted] weeks pregnant. °Summary °· When your water breaks before labor starts, it is called premature rupture of membranes (PROM). °· When PROM happens before 37 weeks of pregnancy, it is called preterm premature rupture of membranes (PPROM). °· If you are not having contractions, your labor may be started for you. °This information is not intended to replace advice given to you by your health care provider. Make sure you discuss any questions you have with your health care provider. °Document Released: 07/18/2008 Document Revised: 12/05/2016 Document Reviewed: 01/10/2016 °Elsevier Interactive Patient Education © 2019 [employer] ° °

## 2018-07-01 ENCOUNTER — Encounter: Payer: Self-pay | Admitting: Obstetrics

## 2018-07-01 ENCOUNTER — Encounter: Payer: Medicaid Other | Admitting: Obstetrics

## 2018-07-02 ENCOUNTER — Encounter: Payer: Medicaid Other | Admitting: Obstetrics

## 2018-07-09 ENCOUNTER — Other Ambulatory Visit: Payer: Self-pay

## 2018-07-09 ENCOUNTER — Inpatient Hospital Stay (HOSPITAL_COMMUNITY)
Admission: AD | Admit: 2018-07-09 | Discharge: 2018-07-11 | DRG: 807 | Disposition: A | Discharge status: Home / Self Care | Payer: Medicaid Other | Attending: Obstetrics & Gynecology | Admitting: Obstetrics & Gynecology

## 2018-07-09 ENCOUNTER — Telehealth: Payer: Self-pay

## 2018-07-09 ENCOUNTER — Inpatient Hospital Stay (HOSPITAL_COMMUNITY): Payer: Medicaid Other | Admitting: Anesthesiology

## 2018-07-09 ENCOUNTER — Encounter (HOSPITAL_COMMUNITY): Payer: Self-pay

## 2018-07-09 DIAGNOSIS — Z3689 Encounter for other specified antenatal screening: Secondary | ICD-10-CM

## 2018-07-09 DIAGNOSIS — O4703 False labor before 37 completed weeks of gestation, third trimester: Secondary | ICD-10-CM

## 2018-07-09 DIAGNOSIS — R110 Nausea: Secondary | ICD-10-CM | POA: Diagnosis not present

## 2018-07-09 DIAGNOSIS — O99824 Streptococcus B carrier state complicating childbirth: Secondary | ICD-10-CM | POA: Diagnosis not present

## 2018-07-09 DIAGNOSIS — O42919 Preterm premature rupture of membranes, unspecified as to length of time between rupture and onset of labor, unspecified trimester: Secondary | ICD-10-CM | POA: Diagnosis present

## 2018-07-09 DIAGNOSIS — O98819 Other maternal infectious and parasitic diseases complicating pregnancy, unspecified trimester: Secondary | ICD-10-CM

## 2018-07-09 DIAGNOSIS — R1111 Vomiting without nausea: Secondary | ICD-10-CM | POA: Diagnosis not present

## 2018-07-09 DIAGNOSIS — Z3A35 35 weeks gestation of pregnancy: Secondary | ICD-10-CM | POA: Diagnosis not present

## 2018-07-09 DIAGNOSIS — H547 Unspecified visual loss: Secondary | ICD-10-CM

## 2018-07-09 DIAGNOSIS — O98811 Other maternal infectious and parasitic diseases complicating pregnancy, first trimester: Secondary | ICD-10-CM

## 2018-07-09 DIAGNOSIS — O42013 Preterm premature rupture of membranes, onset of labor within 24 hours of rupture, third trimester: Secondary | ICD-10-CM | POA: Diagnosis not present

## 2018-07-09 DIAGNOSIS — Z3490 Encounter for supervision of normal pregnancy, unspecified, unspecified trimester: Secondary | ICD-10-CM

## 2018-07-09 DIAGNOSIS — O2341 Unspecified infection of urinary tract in pregnancy, first trimester: Secondary | ICD-10-CM

## 2018-07-09 DIAGNOSIS — E161 Other hypoglycemia: Secondary | ICD-10-CM | POA: Diagnosis not present

## 2018-07-09 DIAGNOSIS — O42913 Preterm premature rupture of membranes, unspecified as to length of time between rupture and onset of labor, third trimester: Principal | ICD-10-CM | POA: Diagnosis present

## 2018-07-09 DIAGNOSIS — O219 Vomiting of pregnancy, unspecified: Secondary | ICD-10-CM

## 2018-07-09 DIAGNOSIS — Z3A00 Weeks of gestation of pregnancy not specified: Secondary | ICD-10-CM | POA: Diagnosis not present

## 2018-07-09 DIAGNOSIS — O09899 Supervision of other high risk pregnancies, unspecified trimester: Secondary | ICD-10-CM

## 2018-07-09 DIAGNOSIS — B951 Streptococcus, group B, as the cause of diseases classified elsewhere: Secondary | ICD-10-CM

## 2018-07-09 DIAGNOSIS — E162 Hypoglycemia, unspecified: Secondary | ICD-10-CM | POA: Diagnosis not present

## 2018-07-09 DIAGNOSIS — A749 Chlamydial infection, unspecified: Secondary | ICD-10-CM | POA: Diagnosis present

## 2018-07-09 DIAGNOSIS — H538 Other visual disturbances: Secondary | ICD-10-CM | POA: Diagnosis not present

## 2018-07-09 DIAGNOSIS — O09219 Supervision of pregnancy with history of pre-term labor, unspecified trimester: Secondary | ICD-10-CM

## 2018-07-09 HISTORY — DX: Personal history of pre-term labor: Z87.51

## 2018-07-09 LAB — COMPREHENSIVE METABOLIC PANEL
ALT: 13 U/L (ref 0–44)
ANION GAP: 8 (ref 5–15)
AST: 17 U/L (ref 15–41)
Albumin: 2.9 g/dL — ABNORMAL LOW (ref 3.5–5.0)
Alkaline Phosphatase: 161 U/L — ABNORMAL HIGH (ref 38–126)
BUN: 6 mg/dL (ref 6–20)
CO2: 23 mmol/L (ref 22–32)
Calcium: 8.8 mg/dL — ABNORMAL LOW (ref 8.9–10.3)
Chloride: 104 mmol/L (ref 98–111)
Creatinine, Ser: 0.61 mg/dL (ref 0.44–1.00)
GFR calc Af Amer: >60 mL/min (ref >60)
GFR calc non Af Amer: >60 mL/min (ref >60)
Glucose, Bld: 78 mg/dL (ref 70–99)
Potassium: 3.2 mmol/L — ABNORMAL LOW (ref 3.5–5.1)
Sodium: 135 mmol/L (ref 135–145)
Total Bilirubin: 0.7 mg/dL (ref 0.3–1.2)
Total Protein: 6.3 g/dL — ABNORMAL LOW (ref 6.5–8.1)

## 2018-07-09 LAB — TYPE AND SCREEN
ABO/RH(D): O POS
Antibody Screen: NEGATIVE

## 2018-07-09 LAB — CBC
HCT: 38.3 % (ref 36.0–46.0)
Hemoglobin: 11.7 g/dL — ABNORMAL LOW (ref 12.0–15.0)
MCH: 23.7 pg — ABNORMAL LOW (ref 26.0–34.0)
MCHC: 30.5 g/dL (ref 30.0–36.0)
MCV: 77.5 fL — ABNORMAL LOW (ref 80.0–100.0)
PLATELETS: 186 10*3/uL (ref 150–400)
RBC: 4.94 MIL/uL (ref 3.87–5.11)
RDW: 13.9 % (ref 11.5–15.5)
WBC: 9.6 10*3/uL (ref 4.0–10.5)
nRBC: 0 % (ref 0.0–0.2)

## 2018-07-09 LAB — URINALYSIS, ROUTINE W REFLEX MICROSCOPIC
BILIRUBIN URINE: NEGATIVE
Glucose, UA: NEGATIVE mg/dL
Hgb urine dipstick: NEGATIVE
Ketones, ur: 20 mg/dL — AB
Nitrite: NEGATIVE
Protein, ur: NEGATIVE mg/dL
Specific Gravity, Urine: 1.017 (ref 1.005–1.030)
pH: 8 (ref 5.0–8.0)

## 2018-07-09 LAB — PROTEIN / CREATININE RATIO, URINE
Creatinine, Urine: 128.7 mg/dL
Protein Creatinine Ratio: 0.09 mg/mg{Cre} (ref 0.00–0.15)
Total Protein, Urine: 12 mg/dL

## 2018-07-09 MED ORDER — OXYCODONE-ACETAMINOPHEN 5-325 MG PO TABS: 1.0000 | ORAL_TABLET | ORAL | Status: DC | PRN

## 2018-07-09 MED ORDER — FENTANYL CITRATE (PF) 100 MCG/2ML IJ SOLN: 100.0000 ug | INTRAMUSCULAR | Status: DC | PRN

## 2018-07-09 MED ORDER — DIPHENHYDRAMINE HCL 50 MG/ML IJ SOLN: 12.5000 mg | INTRAMUSCULAR | Status: DC | PRN

## 2018-07-09 MED ORDER — SOD CITRATE-CITRIC ACID 500-334 MG/5ML PO SOLN: 30.0000 mL | ORAL | Status: DC | PRN

## 2018-07-09 MED ORDER — FLEET ENEMA 7-19 GM/118ML RE ENEM: 1.0000 | ENEMA | RECTAL | Status: DC | PRN

## 2018-07-09 MED ORDER — ACETAMINOPHEN 325 MG PO TABS: 650.0000 mg | ORAL_TABLET | ORAL | Status: DC | PRN

## 2018-07-09 MED ORDER — ONDANSETRON HCL 4 MG/2ML IJ SOLN
4.0000 mg | Freq: Four times a day (QID) | INTRAMUSCULAR | Status: DC | PRN
  Administered 2018-07-09: 4 mg via INTRAVENOUS
  Filled 2018-07-09: qty 2

## 2018-07-09 MED ORDER — PENICILLIN G 3 MILLION UNITS IVPB - SIMPLE MED: 3.0000 10*6.[IU] | INTRAVENOUS | Status: DC

## 2018-07-09 MED ORDER — SODIUM CHLORIDE (PF) 0.9 % IJ SOLN
INTRAMUSCULAR | Status: DC | PRN
  Administered 2018-07-09: 12 mL/h via EPIDURAL

## 2018-07-09 MED ORDER — OXYTOCIN BOLUS FROM INFUSION
500.0000 mL | Freq: Once | INTRAVENOUS | Status: AC
  Administered 2018-07-09: 500 mL/h via INTRAVENOUS

## 2018-07-09 MED ORDER — ONDANSETRON HCL 4 MG PO TABS: 4.0000 mg | ORAL_TABLET | Freq: Three times a day (TID) | ORAL | 0 refills | Status: DC | PRN

## 2018-07-09 MED ORDER — LIDOCAINE HCL (PF) 1 % IJ SOLN
INTRAMUSCULAR | Status: DC | PRN
  Administered 2018-07-09: 7 mL via EPIDURAL
  Administered 2018-07-09: 4 mL via EPIDURAL

## 2018-07-09 MED ORDER — FENTANYL-BUPIVACAINE-NACL 0.5-0.125-0.9 MG/250ML-% EP SOLN
12.0000 mL/h | EPIDURAL | Status: DC | PRN
  Filled 2018-07-09: qty 250

## 2018-07-09 MED ORDER — LACTATED RINGERS IV SOLN
500.0000 mL | Freq: Once | INTRAVENOUS | Status: AC
  Administered 2018-07-09: 500 mL via INTRAVENOUS

## 2018-07-09 MED ORDER — EPHEDRINE 5 MG/ML INJ: 10.0000 mg | INTRAVENOUS | Status: DC | PRN

## 2018-07-09 MED ORDER — PHENYLEPHRINE 40 MCG/ML (10ML) SYRINGE FOR IV PUSH (FOR BLOOD PRESSURE SUPPORT): 80.0000 ug | PREFILLED_SYRINGE | INTRAVENOUS | Status: DC | PRN

## 2018-07-09 MED ORDER — LACTATED RINGERS IV SOLN: 500.0000 mL | INTRAVENOUS | Status: DC | PRN

## 2018-07-09 MED ORDER — OXYCODONE-ACETAMINOPHEN 5-325 MG PO TABS: 2.0000 | ORAL_TABLET | ORAL | Status: DC | PRN

## 2018-07-09 MED ORDER — LACTATED RINGERS IV SOLN
INTRAVENOUS | Status: DC
  Administered 2018-07-09 (×2): via INTRAVENOUS

## 2018-07-09 MED ORDER — LIDOCAINE HCL (PF) 1 % IJ SOLN: 30.0000 mL | INTRAMUSCULAR | Status: DC | PRN

## 2018-07-09 MED ORDER — SODIUM CHLORIDE 0.9 % IV SOLN
5.0000 10*6.[IU] | Freq: Once | INTRAVENOUS | Status: AC
  Administered 2018-07-09: 5 10*6.[IU] via INTRAVENOUS
  Filled 2018-07-09: qty 5

## 2018-07-09 MED ORDER — OXYTOCIN 40 UNITS IN NORMAL SALINE INFUSION - SIMPLE MED
2.5000 [IU]/h | INTRAVENOUS | Status: DC
  Filled 2018-07-09: qty 1000

## 2018-07-09 NOTE — MAU Note (Signed)
Pt arrived EMS. Pt had loss of vision in her right eye earlier this morning. Had vomiting before that. Was here for PTL a few weeks ago. Did not go to last appointment for follow up.

## 2018-07-09 NOTE — Anesthesia Preprocedure Evaluation (Signed)
Anesthesia Evaluation  Patient identified by MRN, date of birth, ID band Patient awake    Reviewed: Allergy & Precautions, H&P , NPO status , Patient's Chart, lab work & pertinent test results  Airway Mallampati: II  TM Distance: >3 FB Neck ROM: full    Dental no notable dental hx.    Pulmonary    Pulmonary exam normal breath sounds clear to auscultation       Cardiovascular negative cardio ROS   Rhythm:regular Rate:Normal     Neuro/Psych negative neurological ROS  negative psych ROS   GI/Hepatic negative GI ROS, Neg liver ROS,   Endo/Other  negative endocrine ROS  Renal/GU negative Renal ROS  negative genitourinary   Musculoskeletal negative musculoskeletal ROS (+)   Abdominal Normal abdominal exam  (+)   Peds  Hematology negative hematology ROS (+)   Anesthesia Other Findings   Reproductive/Obstetrics (+) Pregnancy                             Anesthesia Physical Anesthesia Plan  ASA: II  Anesthesia Plan: Epidural   Post-op Pain Management:    Induction:   PONV Risk Score and Plan:   Airway Management Planned:   Additional Equipment:   Intra-op Plan:   Post-operative Plan:   Informed Consent: I have reviewed the patients History and Physical, chart, labs and discussed the procedure including the risks, benefits and alternatives for the proposed anesthesia with the patient or authorized representative who has indicated his/her understanding and acceptance.       Plan Discussed with:   Anesthesia Plan Comments:         Anesthesia Quick Evaluation

## 2018-07-09 NOTE — H&P (Addendum)
OBSTETRIC ADMISSION HISTORY AND PHYSICAL  Amanda Davies is a 18 y.o. female [id number] with IUP at [redacted]w[redacted]d by early ultrasound admitted for SROM @1600 , clear fluid while in MAU being evaluated for vision changes. Pt reports contractions every 3-4 mins that started prior to ROM. Denies vaginal bleeding. Reporting normal fetal movement. Pt is breathing well through contractions, but planning an epidural when possible.   She received her prenatal care at [facility].  Support person in labor: FOB  Ultrasounds . Anatomy U/S: normal  Prenatal History/Complications: . Preterm premature rupture of membranes . Preterm labor . Chlamydia infection affecting pregnancy (+ at NOB; neg 06/25/18)  Past Medical History: Past Medical History:  Diagnosis Date  . Acne   . Asthma    when younger  . Chlamydia   . Hx of pre-term labor     Past Surgical History: Past Surgical History:  Procedure Laterality Date  . NO PAST SURGERIES      Obstetrical History: OB History    Gravida  4   Para  2   Term  1   Preterm  1   AB  1   Living  2     SAB  1   TAB      Ectopic      Multiple  0   Live Births  2           Social History: Social History   Socioeconomic History  . Marital status: Single    Spouse name: Not on file  . Number of children: 2  . Years of education: 50  . Highest education level: High school graduate  Occupational History  . Not on file  Social Needs  . Financial resource strain: Not on file  . Food insecurity:    Worry: Not on file    Inability: Not on file  . Transportation needs:    Medical: Not on file    Non-medical: Not on file  Tobacco Use  . Smoking status: Never Smoker  . Smokeless tobacco: Never Used  Substance and Sexual Activity  . Alcohol use: No  . Drug use: No  . Sexual activity: Not Currently    Partners: Male    Birth control/protection: None  Lifestyle  . Physical activity:    Days per week: Not on file    Minutes per session: Not  on file  . Stress: Not on file  Relationships  . Social connections:    Talks on phone: Not on file    Gets together: Not on file    Attends religious service: Not on file    Active member of club or organization: Not on file    Attends meetings of clubs or organizations: Not on file    Relationship status: Not on file  Other Topics Concern  . Not on file  Social History Narrative  . Not on file    Family History: Family History  Problem Relation Age of Onset  . Anxiety disorder Mother   . Asthma Sister   . Asthma Brother     Allergies: No Known Allergies  Medications Prior to Admission  Medication Sig Dispense Refill Last Dose  . NIFEdipine (ADALAT CC) 30 MG 24 hr tablet Take 1 tablet (30 mg total) by mouth daily. 30 tablet 1   . Prenatal MV-Min-FA-Omega-3 (PRENATAL GUMMIES/DHA & FA) 0.4-32.5 MG CHEW Chew 1 each by mouth daily. (Patient not taking: Reported on 01/27/2018) 30 tablet 1 Not Taking     Review of  Systems  All systems reviewed and negative except as stated in HPI  Blood pressure 106/66, pulse 90, temperature 98 F (36.7 C), temperature source Oral, resp. rate 16, last menstrual period 10/10/2017, SpO2 100 %, unknown if currently breastfeeding. General appearance: alert, cooperative, appears stated age and mild distress Lungs: no respiratory distress Heart: regular rate  Abdomen: soft, non-tender; gravid  Extremities: Homans sign is negative, no sign of DVT Presentation: cephalic Fetal monitoring: categpr1, FHR 125, moderate variability, +accels, no decels Uterine activity: q4-8 mins    Prenatal labs: ABO, Rh: --/--/PENDING (03/06 1324) Antibody: PENDING (03/06 1324) Rubella: 17.70 (09/25 1638) RPR: Non Reactive (02/21 1821)  HBsAg: Negative (09/25 1638)  HIV: Non Reactive (09/25 1638)  GBS:   pos from 1st tri urine culture Glucola: normal Genetic screening: normal  Prenatal Transfer Tool  Maternal Diabetes: No Genetic Screening:  Normal Maternal Ultrasounds/Referrals: Normal Fetal Ultrasounds or other Referrals:  None Maternal Substance Abuse:  No Significant Maternal Medications:  None Significant Maternal Lab Results: Lab values include: Group B Strep positive  Results for orders placed or performed during the hospital encounter of 07/09/18 (from the past 24 hour(s))  Urinalysis, Routine w reflex microscopic   Collection Time: 07/09/18 12:18 PM  Result Value Ref Range   Color, Urine YELLOW YELLOW   APPearance HAZY (A) CLEAR   Specific Gravity, Urine 1.017 1.005 - 1.030   pH 8.0 5.0 - 8.0   Glucose, UA NEGATIVE NEGATIVE mg/dL   Hgb urine dipstick NEGATIVE NEGATIVE   Bilirubin Urine NEGATIVE NEGATIVE   Ketones, ur 20 (A) NEGATIVE mg/dL   Protein, ur NEGATIVE NEGATIVE mg/dL   Nitrite NEGATIVE NEGATIVE   Leukocytes,Ua LARGE (A) NEGATIVE   RBC / HPF 0-5 0 - 5 RBC/hpf   WBC, UA 6-10 0 - 5 WBC/hpf   Bacteria, UA RARE (A) NONE SEEN   Squamous Epithelial / LPF 0-5 0 - 5   Mucus PRESENT   Protein / creatinine ratio, urine   Collection Time: 07/09/18 12:50 PM  Result Value Ref Range   Creatinine, Urine 128.70 mg/dL   Total Protein, Urine 12 mg/dL   Protein Creatinine Ratio 0.09 0.00 - 0.15 mg/mg[Cre]  Comprehensive metabolic panel   Collection Time: 07/09/18  1:24 PM  Result Value Ref Range   Sodium 135 135 - 145 mmol/L   Potassium 3.2 (L) 3.5 - 5.1 mmol/L   Chloride 104 98 - 111 mmol/L   CO2 23 22 - 32 mmol/L   Glucose, Bld 78 70 - 99 mg/dL   BUN 6 6 - 20 mg/dL   Creatinine, Ser 4.54 0.44 - 1.00 mg/dL   Calcium 8.8 (L) 8.9 - 10.3 mg/dL   Total Protein 6.3 (L) 6.5 - 8.1 g/dL   Albumin 2.9 (L) 3.5 - 5.0 g/dL   AST 17 15 - 41 U/L   ALT 13 0 - 44 U/L   Alkaline Phosphatase 161 (H) 38 - 126 U/L   Total Bilirubin 0.7 0.3 - 1.2 mg/dL   GFR calc non Af Amer >60 >60 mL/min   GFR calc Af Amer >60 >60 mL/min   Anion gap 8 5 - 15  CBC   Collection Time: 07/09/18  1:24 PM  Result Value Ref Range   WBC 9.6  4.0 - 10.5 K/uL   RBC 4.94 3.87 - 5.11 MIL/uL   Hemoglobin 11.7 (L) 12.0 - 15.0 g/dL   HCT 09.8 11.9 - 14.7 %   MCV 77.5 (L) 80.0 - 100.0 fL   MCH  23.7 (L) 26.0 - 34.0 pg   MCHC 30.5 30.0 - 36.0 g/dL   RDW 85.0 27.7 - 41.2 %   Platelets 186 150 - 400 K/uL   nRBC 0.0 0.0 - 0.2 %  Type and screen [facility]   Collection Time: 07/09/18  1:24 PM  Result Value Ref Range   ABO/RH(D) PENDING    Antibody Screen PENDING    Sample Expiration      07/12/2018 Performed at [facility], [address], [place], [place] [address]     Patient Active Problem List   Diagnosis Date Noted  . Preterm premature rupture of membranes 07/09/2018  . Threatened preterm labor 06/25/2018  . Supervision of normal pregnancy 01/27/2018  . GBS (group b Streptococcus) UTI complicating pregnancy, first trimester 01/01/2018  . Chlamydia infection affecting pregnancy 01/01/2018  . Supervision of high risk pregnancy, antepartum 11/27/2016  . H/O preterm delivery, currently pregnant 01/01/2016    Assessment/Plan:  Amanda Davies is a 18 y.o. [id number] at [redacted]w[redacted]d admitted for SROM @1600  while in MAU. Pt received BMZ x2  on 2/21 and 2/22. Pt getting PCN for positive GBS bacteruria. Cervix was 4/70, vtx in MAU. Pt is contracting regularly. Will continue to manage expectantly at this time. Anticipate NSVD.    Labor: expectant management -- pain control: planning epidural  Fetal Wellbeing:  -- Cephalic by SVE.  -- GBS pos -- Continuous fetal monitoring   Postpartum Planning -- Girl fetus -- Breast/formula -- Planning Depo as contraception   Camelia Eng, SNM 4:50 p.m.   CNM attestation:  I have seen and examined this patient; I agree with above documentation in the student midwife's note.   Amanda Davies is a 18 y.o. [id number] here for PROM/latent labor.  PE: BP 135/79   Pulse (!) 107   Temp 97.9 F (36.6 C) (Oral)   Resp 15   LMP 10/10/2017 Comment: Early June  SpO2 100%   Gen: calm comfortable, NAD Resp: normal effort, no distress Abd: gravid  ROS, labs, PMH reviewed  Plan: Admit to Labor & Delivery S/p BMZ x 2 doses PCN for GBS ppx Expectant management for now Anticipate SVD  Amanda Davies CNM 07/09/2018, 8:54 PM

## 2018-07-09 NOTE — Discharge Summary (Signed)
Postpartum Discharge Summary     Patient Name: Amanda Davies DOB: 06/21/2000 MRN: [id number]  Date of admission: 07/09/2018 Delivering Provider: Brand Males   Date of discharge: 07/11/2018  Admitting diagnosis: 35wks high bp vision issue ctx  Intrauterine pregnancy: [redacted]w[redacted]d     Secondary diagnosis:  Active Problems:   H/O preterm delivery, currently pregnant   GBS (group b Streptococcus) UTI complicating pregnancy, first trimester   Chlamydia infection affecting pregnancy   Preterm premature rupture of membranes  Additional problems: none     Discharge diagnosis: Preterm Pregnancy Delivered                                                                                                Post partum procedures:None   Augmentation: none  Complications: None  Hospital course:  Onset of Labor With Vaginal Delivery     18 y.o. yo [id number] at [redacted]w[redacted]d was admitted in Active Labor after PROM on 07/09/2018. She was given PCN for GBS prophylaxis x 1 dose prior to delivery. Patient had an uncomplicated labor course as follows:  Membrane Rupture Time/Date: 3:00 PM ,07/09/2018   Intrapartum Procedures: Episiotomy: None [1]                                         Lacerations:  None [1]  Patient had a delivery of a Viable infant. 07/09/2018  Information for the patient's newborn:  [place], [place] [[id number]]  Delivery Method: Vaginal, Spontaneous(Filed from Delivery Summary)    Pateint had an uncomplicated postpartum course.  She is ambulating, tolerating a regular diet, passing flatus, and urinating well. Patient is discharged home in stable condition on 07/11/18.   Magnesium Sulfate recieved: No BMZ received: Yes  Physical exam  Vitals:   07/10/18 0817 07/10/18 1505 07/10/18 2047 07/11/18 0330  BP: 105/67 (!) 89/53 96/68 95/68   Pulse: 63 79 62 79  Resp: 20 16 18 18   Temp: 97.9 F (36.6 C) 98.3 F (36.8 C) 98 F (36.7 C) 98 F (36.7 C)  TempSrc:  Oral Oral Oral  SpO2:  100%      General: alert, cooperative and no distress Lochia: appropriate Uterine Fundus: firm Incision: N/A DVT Evaluation: No evidence of DVT seen on physical exam. Negative Homan's sign. No cords or calf tenderness. No significant calf/ankle edema. Labs: Lab Results  Component Value Date   WBC 9.6 07/09/2018   HGB 11.7 (L) 07/09/2018   HCT 38.3 07/09/2018   MCV 77.5 (L) 07/09/2018   PLT 186 07/09/2018   CMP Latest Ref Rng & Units 07/09/2018  Glucose 70 - 99 mg/dL 78  BUN 6 - 20 mg/dL 6  Creatinine 1.79 - 1.50 mg/dL 5.69  Sodium 794 - 801 mmol/L 135  Potassium 3.5 - 5.1 mmol/L 3.2(L)  Chloride 98 - 111 mmol/L 104  CO2 22 - 32 mmol/L 23  Calcium 8.9 - 10.3 mg/dL 6.5(V)  Total Protein 6.5 - 8.1 g/dL 6.3(L)  Total Bilirubin 0.3 - 1.2 mg/dL 0.7  Alkaline  Phos 38 - 126 U/L 161(H)  AST 15 - 41 U/L 17  ALT 0 - 44 U/L 13    Discharge instruction: per After Visit Summary and "Baby and Me Booklet".  After visit meds:  Allergies as of 07/11/2018   No Known Allergies     Medication List    STOP taking these medications   NIFEdipine 30 MG 24 hr tablet Commonly known as:  ADALAT CC     TAKE these medications   ibuprofen 800 MG tablet Commonly known as:  ADVIL,MOTRIN Take 1 tablet (800 mg total) by mouth every 8 (eight) hours as needed.   ondansetron 4 MG tablet Commonly known as:  ZOFRAN Take 1 tablet (4 mg total) by mouth every 8 (eight) hours as needed for nausea or vomiting.   senna-docusate 8.6-50 MG tablet Commonly known as:  Senokot-S Take 2 tablets by mouth daily. Start taking on:  July 12, 2018       Diet: routine diet  Activity: Advance as tolerated. Pelvic rest for 6 weeks.   Outpatient follow up:4 weeks Follow up Appt: Future Appointments  Date Time Provider Department Center  07/13/2018  2:30 PM Brock Bad, MD [facility] None   Follow up Visit: Follow-up Information    [facility]. Schedule an appointment as soon as  possible for a visit in 4 week(s).   Specialty:  Obstetrics and Gynecology Why:  Postpartum follow up  Contact information: [address] [place] [address] [phone]          Please schedule this patient for Postpartum visit in: 4 weeks with the following provider: Any provider For C/S patients schedule nurse incision check in weeks 2 weeks: no High risk pregnancy complicated by: preterm delivery Delivery mode:  SVD Anticipated Birth Control: Depo PP Procedures needed: none  Schedule Integrated BH visit: no    Newborn Data: Live born female  Birth Weight: pending  APGAR: 9, 9  Newborn Delivery   Birth date/time:  07/09/2018 20:16:00 Delivery type:  Vaginal, Spontaneous     Baby Feeding: Bottle and Breast Disposition:home with mother   07/11/2018 De Hollingshead, DO

## 2018-07-09 NOTE — Telephone Encounter (Signed)
TC from pt c/o losing vision in right eye, SOB and vomiting. +FM HA's 2 days ago, no swelling Pt advised to report to [facility] for an evaluation ASAP Pt agreeable and voiced understanding.

## 2018-07-09 NOTE — MAU Provider Note (Addendum)
History     CSN: [id number] Arrival date and time: 07/09/18 1201  First Provider Initiated Contact with Patient 07/09/18 1223    Chief Complaint  Patient presents with  . visual loss  . Emesis   HPI 18yo [id number] at [redacted]w[redacted]d who presents to MAU with vision changes after vomiting this morning. Pregnancy has been complicated by PTL admission, GBS(+), chlamydia infection in pregnancy. States has had nausea and vomiting off/on entire pregnancy. Had episode of vomiting this morning because didn't eat breakfast. Right after vomiting noticed that right eye had bright red hue to it and vision was blurry. States now brown on top half of visual field and clear on the bottom but a little blurry. Reports wearing glasses/contacts, near vision. Has gotten better since she got here. Not noticed any problems on left eye. No longer having nausea or vomiting. Also reports feeling occasional contractions, somewhat uncomfortable but doesn't take her breath away and don't last very long. Denies vaginal bleeding, leakage of fluids.    OB History    Gravida  4   Para  2   Term  1   Preterm  1   AB  1   Living  2     SAB  1   TAB      Ectopic      Multiple  0   Live Births  2           Past Medical History:  Diagnosis Date  . Acne   . Asthma    when younger  . Chlamydia   . Hx of pre-term labor     Past Surgical History:  Procedure Laterality Date  . NO PAST SURGERIES      Family History  Problem Relation Age of Onset  . Anxiety disorder Mother   . Asthma Sister   . Asthma Brother     Social History   Tobacco Use  . Smoking status: Never Smoker  . Smokeless tobacco: Never Used  Substance Use Topics  . Alcohol use: No  . Drug use: No    Allergies: No Known Allergies  Medications Prior to Admission  Medication Sig Dispense Refill Last Dose  . NIFEdipine (ADALAT CC) 30 MG 24 hr tablet Take 1 tablet (30 mg total) by mouth daily. 30 tablet 1   . Prenatal  MV-Min-FA-Omega-3 (PRENATAL GUMMIES/DHA & FA) 0.4-32.5 MG CHEW Chew 1 each by mouth daily. (Patient not taking: Reported on 01/27/2018) 30 tablet 1 Not Taking    Review of Systems  Constitutional: Negative for activity change, appetite change, fatigue and unexpected weight change.  Eyes: Positive for visual disturbance. Negative for photophobia, pain, discharge and redness.  Respiratory: Negative for shortness of breath.   Cardiovascular: Negative for chest pain and palpitations.  Gastrointestinal: Positive for nausea and vomiting. Negative for abdominal pain.  Genitourinary: Negative for dysuria, vaginal bleeding and vaginal discharge.  Musculoskeletal: Negative for back pain.  Neurological: Negative for dizziness and headaches.   Physical Exam   Blood pressure 106/66, pulse 90, temperature 98 F (36.7 C), temperature source Oral, resp. rate 16, last menstrual period 10/10/2017, SpO2 100 %, unknown if currently breastfeeding.  Physical Exam  Nursing note and vitals reviewed. Constitutional: She is oriented to person, place, and time. She appears well-developed and well-nourished. No distress.  HENT:  Head: Normocephalic and atraumatic.  Eyes: Pupils are equal, round, and reactive to light. Conjunctivae and EOM are normal. Right eye exhibits no discharge. Left eye exhibits no discharge. Right conjunctiva  is not injected. Left conjunctiva is not injected.  Normal peripheral vision, able to identify numbers with only right eye  normal visual acuity with close distance (does not have glasses to test far distance)   Cardiovascular: Normal rate and intact distal pulses.  Respiratory: Effort normal. No respiratory distress.  GI: There is no abdominal tenderness.  gravid  Genitourinary:    Genitourinary Comments: Declined SVE at this time   Musculoskeletal:        General: No edema.  Neurological: She is alert and oriented to person, place, and time.  Psychiatric: She has a normal mood and  affect. Her behavior is normal.   MAU Course  Procedures  MDM -- vision changes improving, most likely related to burst eye vessel(s) with vomiting episodes, normal peripheral vision, normal acuity with reading up close  -- normal BP but will obtain CBC, CMP, UPC to ensure no signs of preeclampsia   Report and care signed out to Donette Larry, CNM at 1:14PM.   Cristal Deer. Earlene Plater, DO OB/GYN Fellow  Cervix rechecked, no change. Labs normal except mild Hypokalemia. Stable for discharge home.  Just prior to discharge pt reports feeling "pop" and leaking clear fluid SSE: +pool, fern pos  Assessment and Plan  [redacted] weeks gestation PROM Admit to LD Mngt per labor team  Donette Larry, CNM  07/09/2018 3:45 PM

## 2018-07-09 NOTE — Anesthesia Pain Management Evaluation Note (Signed)
  CRNA Pain Management Visit Note  Patient: Amanda Davies, 18 y.o., female  "Hello I am a member of the anesthesia team at [facility]. We have an anesthesia team available at all times to provide care throughout the hospital, including epidural management and anesthesia for C-section. I don't know your plan for the delivery whether it a natural birth, water birth, IV sedation, nitrous supplementation, doula or epidural, but we want to meet your pain goals."   1.Was your pain managed to your expectations on prior hospitalizations?   No prior hospitalizations  2.What is your expectation for pain management during this hospitalization?     Epidural and IV pain meds  3.How can we help you reach that goal? Epidural infusing, patient comfortable  Record the patient's initial score and the patient's pain goal.   Pain: 3  Pain Goal: 6 [facility] wants you to be able to say your pain was always managed very well.  [facility] 07/09/2018

## 2018-07-09 NOTE — Anesthesia Procedure Notes (Signed)
Epidural Patient location during procedure: OB Start time: 07/09/2018 4:45 PM End time: 07/09/2018 4:49 PM  Staffing Anesthesiologist: Leilani Able, MD Performed: anesthesiologist   Preanesthetic Checklist Completed: patient identified, site marked, surgical consent, pre-op evaluation, timeout performed, IV checked, risks and benefits discussed and monitors and equipment checked  Epidural Patient position: sitting Prep: site prepped and draped and DuraPrep Patient monitoring: continuous pulse ox and blood pressure Approach: midline Location: L3-L4 Injection technique: LOR air  Needle:  Needle type: Tuohy  Needle gauge: 17 G Needle length: 9 cm and 9 Needle insertion depth: 5 cm cm Catheter type: closed end flexible Catheter size: 19 Gauge Catheter at skin depth: 10 cm Test dose: negative and Other  Assessment Sensory level: T9 Events: blood not aspirated, injection not painful, no injection resistance, negative IV test and no paresthesia  Additional Notes Reason for block:procedure for pain

## 2018-07-10 LAB — RPR: RPR: NONREACTIVE

## 2018-07-10 MED ORDER — IBUPROFEN 600 MG PO TABS
600.0000 mg | ORAL_TABLET | Freq: Four times a day (QID) | ORAL | Status: DC
  Administered 2018-07-10 – 2018-07-11 (×7): 600 mg via ORAL
  Filled 2018-07-10 (×7): qty 1

## 2018-07-10 MED ORDER — SENNOSIDES-DOCUSATE SODIUM 8.6-50 MG PO TABS
2.0000 | ORAL_TABLET | ORAL | Status: DC
  Administered 2018-07-10: 2 via ORAL
  Filled 2018-07-10: qty 2

## 2018-07-10 MED ORDER — PRENATAL MULTIVITAMIN CH
1.0000 | ORAL_TABLET | Freq: Every day | ORAL | Status: DC
  Administered 2018-07-10 – 2018-07-11 (×2): 1 via ORAL
  Filled 2018-07-10 (×2): qty 1

## 2018-07-10 MED ORDER — COCONUT OIL OIL: 1.0000 "application " | TOPICAL_OIL | Status: DC | PRN

## 2018-07-10 MED ORDER — DIBUCAINE 1 % RE OINT: 1.0000 "application " | TOPICAL_OINTMENT | RECTAL | Status: DC | PRN

## 2018-07-10 MED ORDER — ONDANSETRON HCL 4 MG PO TABS: 4.0000 mg | ORAL_TABLET | ORAL | Status: DC | PRN

## 2018-07-10 MED ORDER — SIMETHICONE 80 MG PO CHEW: 80.0000 mg | CHEWABLE_TABLET | ORAL | Status: DC | PRN

## 2018-07-10 MED ORDER — BENZOCAINE-MENTHOL 20-0.5 % EX AERO
1.0000 "application " | INHALATION_SPRAY | CUTANEOUS | Status: DC | PRN
  Administered 2018-07-10: 1 via TOPICAL
  Filled 2018-07-10: qty 56

## 2018-07-10 MED ORDER — TETANUS-DIPHTH-ACELL PERTUSSIS 5-2.5-18.5 LF-MCG/0.5 IM SUSP: 0.5000 mL | Freq: Once | INTRAMUSCULAR | Status: DC

## 2018-07-10 MED ORDER — ONDANSETRON HCL 4 MG/2ML IJ SOLN: 4.0000 mg | INTRAMUSCULAR | Status: DC | PRN

## 2018-07-10 MED ORDER — ACETAMINOPHEN 325 MG PO TABS
650.0000 mg | ORAL_TABLET | ORAL | Status: DC | PRN
  Administered 2018-07-10: 650 mg via ORAL
  Filled 2018-07-10: qty 2

## 2018-07-10 MED ORDER — OXYCODONE-ACETAMINOPHEN 5-325 MG PO TABS: 2.0000 | ORAL_TABLET | ORAL | Status: DC | PRN

## 2018-07-10 MED ORDER — ZOLPIDEM TARTRATE 5 MG PO TABS: 5.0000 mg | ORAL_TABLET | Freq: Every evening | ORAL | Status: DC | PRN

## 2018-07-10 MED ORDER — DIPHENHYDRAMINE HCL 25 MG PO CAPS: 25.0000 mg | ORAL_CAPSULE | Freq: Four times a day (QID) | ORAL | Status: DC | PRN

## 2018-07-10 MED ORDER — WITCH HAZEL-GLYCERIN EX PADS: 1.0000 "application " | MEDICATED_PAD | CUTANEOUS | Status: DC | PRN

## 2018-07-10 MED ORDER — OXYCODONE-ACETAMINOPHEN 5-325 MG PO TABS: 1.0000 | ORAL_TABLET | ORAL | Status: DC | PRN

## 2018-07-10 NOTE — Progress Notes (Signed)
POSTPARTUM PROGRESS NOTE  Post Partum Day 1  Subjective:  Amanda Davies is a 18 y.o. [id number] s/p SVD at [redacted]w[redacted]d.  She reports she is doing well. No acute events overnight. She denies any problems with ambulating, voiding or po intake. Denies nausea or vomiting.  Pain is well controlled.  Lochia is mild. Complaining of some mild right eye blurriness, similar to what she experienced in MAU.  Objective: Blood pressure 105/67, pulse 63, temperature 97.9 F (36.6 C), resp. rate 20, last menstrual period 10/10/2017, SpO2 100 %, unknown if currently breastfeeding.  Physical Exam:  General: alert, cooperative and no distress Chest: no respiratory distress Heart:regular rate, distal pulses intact Abdomen: soft, nontender,  Uterine Fundus: firm, appropriately tender DVT Evaluation: No calf swelling or tenderness Extremities: mild LE edema Skin: warm, dry  Recent Labs    07/09/18 1324  HGB 11.7*  HCT 38.3    Assessment/Plan: Amanda Davies is a 18 y.o. [id number] s/p SVD at [redacted]w[redacted]d   PPD#1 - Doing well  Routine postpartum care  One elevated BP to139/88 overnight, otherwise blood pressures in the 90-100's systolically. Will continue to monitor at this time. Contraception: Depo-provera Feeding: Breast Dispo: Plan for discharge tomorrow.    LOS: 1 day   Orpah Cobb, D.O. [facility], PGY1 07/10/2018, 9:09 AM

## 2018-07-10 NOTE — Lactation Note (Signed)
This note was copied from a baby's chart. Lactation Consultation Note  Patient Name: Amanda Davies [id number] Date: 07/10/2018 Reason for consult: Follow-up assessment;Late-preterm 34-36.6wks;Infant weight loss;Infant < 6lbs  23 hours old LPI < 5 lbs female who is being partially BF and formula fed by her mother. RN Amanda Davies called for Amanda Davies assistance because mom has not been supplementing baby consistently. Reviewed LPI guidelines with mom and she understood the importance of supplementation with either EBM or Similac 22 after every single feeding. Baby will be turning 24 hours soon, mom is aware that the volumes will go up every time baby turns 24 hours older. Baby asleep in mom's bed right next to her, talk to mom about safe sleep practices and asked her to call her RN when she feels tired/sleepy in order to put baby back to her bassinet.  Mom is experienced BF and states that feedings at the breast are going well, baby has LATCH scores of 8 and 9 but explained to mom that because of baby's size and birth weight she might not be able to empty the breast and pointed out how important is pumping, mom has not been pumping today. She told LC that pumping is not in her plans while at the [facility] but she'll pump after she gets discharged, she understands that baby will not be able to empty the breast completely after her milk comes in and that she'll need to pump in order to avoid engorgement.  Feeding plan:  1. Encouraged mom to keep feeding baby at the breast every 3 hours or sooner if feeding cues are present 2. Mom will start supplementing consistently with Similac 22 calorie formula after every feeding at the breast, starting with 10 ml, going up to 20 ml as needed 3. She may pump to her discretion, encouraged her to feed baby any amount of EMB she may get prior giving her formula  Mom reported all questions and concerns were answered, she's aware of LC services and will call PRN.  Maternal Data     Feeding Feeding Type: Breast Fed  LATCH Score Latch: Grasps breast easily, tongue down, lips flanged, rhythmical sucking.  Audible Swallowing: A few with stimulation  Type of Nipple: Everted at rest and after stimulation  Comfort (Breast/Nipple): Soft / non-tender  Hold (Positioning): No assistance needed to correctly position infant at breast.  LATCH Score: 9  Interventions Interventions: Breast feeding basics reviewed  Lactation Tools Discussed/Used     Consult Status Consult Status: Follow-up Date: 07/11/18 Follow-up type: In-patient    Amanda Davies Amanda Davies 07/10/2018, 7:33 PM

## 2018-07-10 NOTE — Anesthesia Postprocedure Evaluation (Signed)
Anesthesia Post Note  Patient: Amanda Davies  Procedure(s) Performed: AN AD HOC LABOR EPIDURAL     Patient location during evaluation: Mother Baby Anesthesia Type: Epidural Level of consciousness: awake and awake and alert Pain management: pain level controlled Vital Signs Assessment: post-procedure vital signs reviewed and stable Respiratory status: spontaneous breathing Cardiovascular status: stable Postop Assessment: no headache, no backache, patient able to bend at knees and no apparent nausea or vomiting Anesthetic complications: no    Last Vitals:  Vitals:   07/10/18 0624 07/10/18 0817  BP: 105/67 105/67  Pulse: (!) 55 63  Resp: 18 20  Temp: 36.7 C 36.6 C  SpO2:      Last Pain:  Vitals:   07/10/18 0817  TempSrc:   PainSc: 2    Pain Goal:                   Caren Macadam

## 2018-07-10 NOTE — Lactation Note (Signed)
This note was copied from a baby's chart. Lactation Consultation Note  Patient Name: Amanda Davies QASUO'R Date: 07/10/2018 Reason for consult: Initial assessment;Late-preterm 34-36.6wks;Infant < 6lbs P3, 5 hour female infant. Per mom, she breastfeed her two and one year old for 9 months, they were born at 60 and [redacted] weeks gestation. LC changed one stool (meconium) while in room. Mom's breastfeeding choice at admission is breast and bottle feeding. Mom latched infant on left breast using the  cross cradle hold, few swallows observed, infant with deep latch, nose to breast and infant breastfeed for 15 minutes. Mom demonstrated hand expression and infant was given 4 ml of colostrum by spoon and infant was supplement with 3 ml of Similac 22 kcal Neosure with iron according to LPTI policy of supplementation based on age/ hours of life. Mom is doing STS as much as possible. Mom knows to breastfeed every 3 hours or sooner if infant appears hungry or showing cues and wake baby if infant doesn't show cues. Mom will not breastfeed longer than 30 minutes at a time. Mom shown how to use DEBP & how to disassemble, clean, & reassemble parts. LC discussed I & O. Mom knows to call Nurse or LC if she has any questions, concerns or need assistance with latching infant to breast.  Mom made aware of O/P services, breastfeeding support groups, community resources, and our phone # for post-discharge questions.  Mom's plan: 1. Mom will breastfeed according hunger cues and LPTI policy. 2. Mom will give infant EBM/ and  or formula after breastfeeding. 3,. Mom plans to pump every 3 hours for 15 minutes on initial setting.  Maternal Data Formula Feeding for Exclusion: Yes Reason for exclusion: Mother's choice to formula and breast feed on admission Has patient been taught Hand Expression?: Yes(Infant was given 4 ml of colostrum by spoon.) Does the patient have breastfeeding experience prior to this delivery?:  Yes  Feeding Feeding Type: Breast Fed Nipple Type: Slow - flow  LATCH Score Latch: Grasps breast easily, tongue down, lips flanged, rhythmical sucking.  Audible Swallowing: A few with stimulation  Type of Nipple: Everted at rest and after stimulation  Comfort (Breast/Nipple): Soft / non-tender  Hold (Positioning): Assistance needed to correctly position infant at breast and maintain latch.  LATCH Score: 8  Interventions Interventions: Breast feeding basics reviewed;Assisted with latch;Skin to skin;Breast massage;Adjust position;Support pillows;Hand express;Expressed milk;DEBP;Hand pump  Lactation Tools Discussed/Used Tools: Pump Breast pump type: Double-Electric Breast Pump WIC Program: Yes Pump Review: Setup, frequency, and cleaning;Milk Storage Initiated by:: Danelle Earthly, IBCLC Date initiated:: 07/10/18   Consult Status Consult Status: Follow-up Date: 07/10/18 Follow-up type: In-patient    Danelle Earthly 07/10/2018, 1:59 AM

## 2018-07-11 LAB — CULTURE, BETA STREP (GROUP B ONLY)

## 2018-07-11 MED ORDER — IBUPROFEN 800 MG PO TABS: 800.0000 mg | ORAL_TABLET | Freq: Three times a day (TID) | ORAL | 1 refills | Status: DC | PRN

## 2018-07-11 MED ORDER — SENNOSIDES-DOCUSATE SODIUM 8.6-50 MG PO TABS: 2.0000 | ORAL_TABLET | ORAL | 0 refills | Status: DC

## 2018-07-11 NOTE — Discharge Instructions (Signed)

## 2018-07-12 ENCOUNTER — Ambulatory Visit: Payer: Self-pay

## 2018-07-12 NOTE — Lactation Note (Signed)
This note was copied from a baby's chart. Lactation Consultation Note  Patient Name: Amanda Davies Date: 07/12/2018 Reason for consult: Follow-up assessment;Late-preterm 34-36.6wks;Infant < 6lbs;Infant weight loss  Baby is 50 hours old as LC entered the room / baby latched with depth/ multiple swallows noted and increased  With compressions /per mom comfortable.  This is moms 3rd baby and she is experienced breast feeder and mentioned she has had engorgement issues.  LC reviewed sore nipple and engorgement prevention and tx.  Per mom  has only pumped x 2 with the DEBPin the room in the last 24 hours and doesn't have  a DEBP for home.  Per mom would like to sign up for [facility] and its been 2 years since she has been signed up .  LC notified the [facility] rep at [facility] and they were willing to see mom to provide a DEBP - due to her baby being  Less than 5 pounds / weight loss/ and early baby 35 5/7 weeks/ and jaundice.  Facility NP was mentioned to [facility] she would provide a prescription for formula Neosure - supplement.  Baby was being re-weighed at 3 pm and if weight if ok would be D/C.   LC reviewed the [facility] plan for a LPT infant / <5 pounds .  Discussed nutritive vs non - nutritive feeding patterns and the importance of watching the baby for hanging out latched.  If the baby was due to eat and to sluggish to latch to feed the baby her supplement 1st and then the breast.  If she is wide awake and ready to feed latch at the breast 15 -20 mins / and then supplement with at least 30 ml  And gradually increase the volume  After breast feeding - post pump both breast for 15 -20 mins and save milk to supplement back to baby.  Storage of breast milk discussed.   LC recommended to give the baby some growing time 1-2 weeks and consider F/U with LC O/P appt at the [facility] health / mom has the number.    Maternal Data    Feeding Feeding Type: Formula  LATCH Score Latch: (latched with depth )  Audible  Swallowing: (multiple swallows )  Type of Nipple: (nipple well rounded when baby released )  Comfort (Breast/Nipple): (per mom comfortable )  Hold (Positioning): (mom independent with latch )     Interventions Interventions: Breast feeding basics reviewed(WIC to see mom and provide DEBP )  Lactation Tools Discussed/Used Tools: Pump Breast pump type: Double-Electric Breast Pump WIC Program: Yes(LC spoke with WIC and they will see them for DEBP ) Pump Review: Milk Storage Initiated by:: LC reviewed    Consult Status Consult Status: Complete Date: 07/12/18 Follow-up type: In-patient    Amanda Davies Amanda Davies 07/12/2018, 1:58 PM

## 2018-07-13 ENCOUNTER — Encounter: Payer: Medicaid Other | Admitting: Obstetrics

## 2018-08-09 ENCOUNTER — Ambulatory Visit: Payer: Medicaid Other | Admitting: Advanced Practice Midwife

## 2019-01-20 ENCOUNTER — Ambulatory Visit (INDEPENDENT_AMBULATORY_CARE_PROVIDER_SITE_OTHER): Payer: Medicaid Other

## 2019-01-20 ENCOUNTER — Other Ambulatory Visit: Payer: Self-pay

## 2019-01-20 DIAGNOSIS — Z3201 Encounter for pregnancy test, result positive: Secondary | ICD-10-CM

## 2019-01-20 DIAGNOSIS — O219 Vomiting of pregnancy, unspecified: Secondary | ICD-10-CM

## 2019-01-20 DIAGNOSIS — Z3490 Encounter for supervision of normal pregnancy, unspecified, unspecified trimester: Secondary | ICD-10-CM

## 2019-01-20 LAB — POCT URINE PREGNANCY: Preg Test, Ur: POSITIVE — AB

## 2019-01-20 MED ORDER — VITAFOL-ONE 29-1-200 MG PO CAPS: 1.0000 | ORAL_CAPSULE | Freq: Every day | ORAL | 11 refills | Status: DC

## 2019-01-20 MED ORDER — DOXYLAMINE-PYRIDOXINE 10-10 MG PO TBEC: 2.0000 | DELAYED_RELEASE_TABLET | Freq: Every day | ORAL | 5 refills | Status: DC

## 2019-01-20 MED ORDER — BLOOD PRESSURE KIT DEVI: 1.0000 | 0 refills | Status: DC | PRN

## 2019-01-20 NOTE — Progress Notes (Signed)
Agree with A & P. 

## 2019-01-20 NOTE — Progress Notes (Signed)
Ms. Derstine presents today for UPT. She has no unusual complaints and complains of nausea and vomiting.  LMP: Pt can not estimate LMP    OBJECTIVE: Appears well, in no apparent distress.  OB History    Gravida  5   Para  3   Term  1   Preterm  2   AB  1   Living  3     SAB  1   TAB      Ectopic      Multiple  0   Live Births  3          Home UPT Result: Positive  In-Office UPT result: Positive  I have reviewed the patient's medical, obstetrical, social, and family histories, and medications.   ASSESSMENT: Positive pregnancy test  PLAN Prenatal care to be completed at:  [facility] Next availability u/s at [facility] is in Oct.  Pt will report to [facility] [place] [facility] for dating u/s  Pt will c/b to schedule NOB visit after dating u/s Diclegis and PNV's sent to pharmacy

## 2019-01-26 ENCOUNTER — Other Ambulatory Visit: Payer: Self-pay

## 2019-01-26 MED ORDER — VITAFOL GUMMIES 3.33-0.333-34.8 MG PO CHEW: 1.0000 | CHEWABLE_TABLET | Freq: Every day | ORAL | 4 refills | Status: DC

## 2019-01-26 NOTE — Progress Notes (Signed)
vitafol gummies sent per pt request

## 2019-01-28 DIAGNOSIS — Z3490 Encounter for supervision of normal pregnancy, unspecified, unspecified trimester: Secondary | ICD-10-CM | POA: Diagnosis not present

## 2019-02-08 ENCOUNTER — Other Ambulatory Visit: Payer: Self-pay | Admitting: *Deleted

## 2019-02-08 DIAGNOSIS — Z3490 Encounter for supervision of normal pregnancy, unspecified, unspecified trimester: Secondary | ICD-10-CM

## 2019-02-08 MED ORDER — SENNOSIDES-DOCUSATE SODIUM 8.6-50 MG PO TABS: 2.0000 | ORAL_TABLET | ORAL | 0 refills | Status: DC

## 2019-02-08 NOTE — Progress Notes (Signed)
Pt called to office stating that she is having N&V and is getting no relief with Diclegis or Phenergan.  Pt was made aware of dietary changes and recommendations to help curb the N&V.  Pt made aware to try to snack throughout the day and try protein shakes as needed. Pt states she is able to keep some fluids down but not solids.  Pt advised if she cannot tolerate much solids/fluids, she should be seen at hospital for eval and management.  Pt also request refill on stool softener. Senokot was refilled today. Pt advised to contact office for any further needs.

## 2019-02-10 ENCOUNTER — Encounter (HOSPITAL_COMMUNITY): Payer: Self-pay | Admitting: *Deleted

## 2019-02-10 ENCOUNTER — Other Ambulatory Visit: Payer: Self-pay

## 2019-02-10 ENCOUNTER — Inpatient Hospital Stay (HOSPITAL_COMMUNITY)
Admission: AD | Admit: 2019-02-10 | Discharge: 2019-02-10 | Disposition: A | Discharge status: Home / Self Care | Payer: Medicaid Other | Attending: Obstetrics and Gynecology | Admitting: Obstetrics and Gynecology

## 2019-02-10 DIAGNOSIS — R112 Nausea with vomiting, unspecified: Secondary | ICD-10-CM | POA: Diagnosis present

## 2019-02-10 DIAGNOSIS — O99891 Other specified diseases and conditions complicating pregnancy: Secondary | ICD-10-CM | POA: Diagnosis not present

## 2019-02-10 DIAGNOSIS — O219 Vomiting of pregnancy, unspecified: Secondary | ICD-10-CM | POA: Diagnosis not present

## 2019-02-10 DIAGNOSIS — O26891 Other specified pregnancy related conditions, first trimester: Secondary | ICD-10-CM

## 2019-02-10 DIAGNOSIS — Z8751 Personal history of pre-term labor: Secondary | ICD-10-CM | POA: Insufficient documentation

## 2019-02-10 DIAGNOSIS — M549 Dorsalgia, unspecified: Secondary | ICD-10-CM | POA: Diagnosis not present

## 2019-02-10 DIAGNOSIS — Z3A09 9 weeks gestation of pregnancy: Secondary | ICD-10-CM | POA: Diagnosis not present

## 2019-02-10 LAB — URINALYSIS, ROUTINE W REFLEX MICROSCOPIC
Bilirubin Urine: NEGATIVE
Glucose, UA: NEGATIVE mg/dL
Hgb urine dipstick: NEGATIVE
Ketones, ur: 80 mg/dL — AB
Nitrite: NEGATIVE
Protein, ur: 30 mg/dL — AB
Specific Gravity, Urine: 1.028 (ref 1.005–1.030)
pH: 6 (ref 5.0–8.0)

## 2019-02-10 MED ORDER — LACTATED RINGERS IV BOLUS
1000.0000 mL | Freq: Once | INTRAVENOUS | Status: AC
  Administered 2019-02-10: 1000 mL via INTRAVENOUS

## 2019-02-10 MED ORDER — CYCLOBENZAPRINE HCL 10 MG PO TABS: 10.0000 mg | ORAL_TABLET | Freq: Two times a day (BID) | ORAL | 0 refills | Status: DC | PRN

## 2019-02-10 MED ORDER — CYCLOBENZAPRINE HCL 10 MG PO TABS
10.0000 mg | ORAL_TABLET | Freq: Once | ORAL | Status: AC
  Administered 2019-02-10: 10 mg via ORAL
  Filled 2019-02-10: qty 1

## 2019-02-10 MED ORDER — ONDANSETRON HCL 4 MG PO TABS: 4.0000 mg | ORAL_TABLET | Freq: Three times a day (TID) | ORAL | 1 refills | Status: DC | PRN

## 2019-02-10 MED ORDER — FAMOTIDINE 20 MG PO TABS: 20.0000 mg | ORAL_TABLET | Freq: Two times a day (BID) | ORAL | 0 refills | Status: DC

## 2019-02-10 MED ORDER — ONDANSETRON HCL 4 MG/2ML IJ SOLN
4.0000 mg | Freq: Once | INTRAMUSCULAR | Status: AC
  Administered 2019-02-10: 4 mg via INTRAVENOUS
  Filled 2019-02-10: qty 2

## 2019-02-10 MED ORDER — PANTOPRAZOLE SODIUM 40 MG IV SOLR
40.0000 mg | Freq: Once | INTRAVENOUS | Status: AC
  Administered 2019-02-10: 40 mg via INTRAVENOUS
  Filled 2019-02-10: qty 40

## 2019-02-10 NOTE — MAU Note (Signed)
Pt c/o N/V past 3 days. Has not been able to keep anything down. Also c/o Constipation and was given Rx for senicot  Which she took today and has has good results but reports some stomach cramping and back pain with it. Also c/o headache.

## 2019-02-10 NOTE — MAU Provider Note (Signed)
History     CSN: [id number]  Arrival date and time: 02/10/19 1420   First Provider Initiated Contact with Patient 02/10/19 1827      Chief Complaint  Patient presents with  . Emesis  . Nausea   Amanda Davies is a 18 y.o. [id number] at 69w0dwho receives care at [facility].  She presents today for Emesis and Nausea.  She reports that she has been vomiting and unable to keep anything down for the past 3 days.  She states she has been taking diclegis with no improvement in symptoms, but has not taken it since it was initially prescribed.  She reports that she has taken zofran in previous pregnancies with good results. Patient states that she has been experiencing intermittent back pain and feels that it is from her history of epidural usage.  She states that since pregnancy she has noticed that the pain it is more frequent. She denies any aggravating or relieving factors, but has not taken anything for the pain.  Patient denies vaginal concerns including bleeding, leaking, or discharge.      OB History    Gravida  5   Para  3   Term  1   Preterm  2   AB  1   Living  3     SAB  1   TAB      Ectopic      Multiple  0   Live Births  3           Past Medical History:  Diagnosis Date  . Acne   . Asthma    when younger  . Chlamydia   . Hx of pre-term labor     Past Surgical History:  Procedure Laterality Date  . NO PAST SURGERIES      Family History  Problem Relation Age of Onset  . Anxiety disorder Mother   . Asthma Sister   . Asthma Brother     Social History   Tobacco Use  . Smoking status: Never Smoker  . Smokeless tobacco: Never Used  Substance Use Topics  . Alcohol use: No  . Drug use: No    Allergies: No Known Allergies  Medications Prior to Admission  Medication Sig Dispense Refill Last Dose  . Doxylamine-Pyridoxine (DICLEGIS) 10-10 MG TBEC Take 2 tablets by mouth at bedtime. If symptoms persist, add one tablet in the morning and one in the  afternoon 100 tablet 5 Past Week at Unknown time  . Prenatal Vit-Fe Phos-FA-Omega (VITAFOL GUMMIES) 3.33-0.333-34.8 MG CHEW Chew 1 Dose by mouth daily. 90 tablet 4 Past Month at Unknown time  . senna-docusate (SENOKOT-S) 8.6-50 MG tablet Take 2 tablets by mouth daily. 20 tablet 0 02/09/2019 at Unknown time  . Blood Pressure Monitoring (BLOOD PRESSURE KIT) DEVI 1 Device by Does not apply route as needed. 1 Device 0   . ibuprofen (ADVIL,MOTRIN) 800 MG tablet Take 1 tablet (800 mg total) by mouth every 8 (eight) hours as needed. (Patient not taking: Reported on 01/20/2019) 30 tablet 1   . ondansetron (ZOFRAN) 4 MG tablet Take 1 tablet (4 mg total) by mouth every 8 (eight) hours as needed for nausea or vomiting. (Patient not taking: Reported on 01/20/2019) 20 tablet 0   . Prenatal Vit-FePoly-FA-DHA (VITAFOL-ONE) 29-1-200 MG CAPS Take 1 capsule by mouth daily. 30 capsule 11     Review of Systems  Constitutional: Negative for chills and fever.  Respiratory: Negative for cough and shortness of breath.   Gastrointestinal:  Positive for nausea and vomiting. Negative for abdominal pain, constipation and diarrhea.  Genitourinary: Negative for difficulty urinating, dysuria, vaginal bleeding and vaginal discharge.  Musculoskeletal: Positive for back pain.  Neurological: Negative for dizziness, light-headedness and headaches.   Physical Exam   Blood pressure 106/69, pulse 81, temperature 98 F (36.7 C), resp. rate 18, height '5\' 1"'  (1.549 m), weight 52.9 kg, unknown if currently breastfeeding.  Physical Exam  Constitutional: She is oriented to person, place, and time. She appears well-developed and well-nourished.  HENT:  Head: Normocephalic and atraumatic.  Eyes: Conjunctivae are normal.  Neck: Normal range of motion.  Cardiovascular: Normal rate and normal heart sounds.  Respiratory: Effort normal and breath sounds normal.  GI: Soft. Bowel sounds are normal.  Musculoskeletal: Normal range of motion.   Neurological: She is alert and oriented to person, place, and time.  Skin: Skin is warm and dry.  Psychiatric: She has a normal mood and affect. Her behavior is normal.    MAU Course  Procedures Results for orders placed or performed during the hospital encounter of 02/10/19 (from the past 24 hour(s))  Urinalysis, Routine w reflex microscopic     Status: Abnormal   Collection Time: 02/10/19  4:57 PM  Result Value Ref Range   Color, Urine YELLOW YELLOW   APPearance HAZY (A) CLEAR   Specific Gravity, Urine 1.028 1.005 - 1.030   pH 6.0 5.0 - 8.0   Glucose, UA NEGATIVE NEGATIVE mg/dL   Hgb urine dipstick NEGATIVE NEGATIVE   Bilirubin Urine NEGATIVE NEGATIVE   Ketones, ur 80 (A) NEGATIVE mg/dL   Protein, ur 30 (A) NEGATIVE mg/dL   Nitrite NEGATIVE NEGATIVE   Leukocytes,Ua TRACE (A) NEGATIVE   RBC / HPF 0-5 0 - 5 RBC/hpf   WBC, UA 6-10 0 - 5 WBC/hpf   Bacteria, UA RARE (A) NONE SEEN   Squamous Epithelial / LPF 0-5 0 - 5   Mucus PRESENT     MDM Start IV LR Bolus Antiemetic PPI Assessment and Plan  18 year old, [id number]  SIUP at 9weeks Nausea/Vomiting Back Pain  -Reviewed POC with patient. -Discussed starting diclegis tonight and will send script for Zofran for PRN usage. -Will also send script for pepcid for daily usage.  -Educated on how back pain may be related to sleeping or activity, but lower back pain is usually not associated with epidural placement. -Will give flexeril after tolerating po. -Patient agreeable with plan and has no questions or concerns.  Maryann Conners, MSN, CNM 02/10/2019, 6:27 PM   Reassessment (7:34 PM) -Nurse reports patient given crackers and ginger ale. -Will order flexeril and reassess for tolerance.   Reassessment (8:13 PM) -Patient requests additional bag of fluid stating that she anticipates vomiting tomorrow and would like to be well hydrated. -Will give another bag and plan for discharge upon completion.  Reassessment (9:13  PM) -Patient reports improvement in back pain from 10 to 6. -Offered and accepts heating pad. -Informed that will send script to pharmacy for home usage.  -Encouraged to call or return to MAU if symptoms worsen or with the onset of new symptoms. -Discharge orders placed.  Maryann Conners MSN, CNM Advanced Practice Provider, [facility]

## 2019-02-10 NOTE — Discharge Instructions (Signed)

## 2019-02-22 ENCOUNTER — Ambulatory Visit (INDEPENDENT_AMBULATORY_CARE_PROVIDER_SITE_OTHER): Payer: Medicaid Other | Admitting: Obstetrics

## 2019-02-22 ENCOUNTER — Encounter: Payer: Self-pay | Admitting: Obstetrics

## 2019-02-22 ENCOUNTER — Other Ambulatory Visit: Payer: Self-pay

## 2019-02-22 ENCOUNTER — Other Ambulatory Visit (HOSPITAL_COMMUNITY)
Admission: RE | Admit: 2019-02-22 | Discharge: 2019-02-22 | Disposition: A | Discharge status: Home / Self Care | Payer: Medicaid Other | Source: Ambulatory Visit | Attending: Obstetrics | Admitting: Obstetrics

## 2019-02-22 VITALS — BP 104/71 | HR 118 | Wt 118.0 lb

## 2019-02-22 DIAGNOSIS — O0990 Supervision of high risk pregnancy, unspecified, unspecified trimester: Secondary | ICD-10-CM

## 2019-02-22 DIAGNOSIS — Z8619 Personal history of other infectious and parasitic diseases: Secondary | ICD-10-CM

## 2019-02-22 DIAGNOSIS — Z3A10 10 weeks gestation of pregnancy: Secondary | ICD-10-CM

## 2019-02-22 DIAGNOSIS — Z87440 Personal history of urinary (tract) infections: Secondary | ICD-10-CM

## 2019-02-22 DIAGNOSIS — O0991 Supervision of high risk pregnancy, unspecified, first trimester: Secondary | ICD-10-CM

## 2019-02-22 DIAGNOSIS — O09891 Supervision of other high risk pregnancies, first trimester: Secondary | ICD-10-CM | POA: Diagnosis not present

## 2019-02-22 DIAGNOSIS — O09899 Supervision of other high risk pregnancies, unspecified trimester: Secondary | ICD-10-CM

## 2019-02-22 DIAGNOSIS — O210 Mild hyperemesis gravidarum: Secondary | ICD-10-CM

## 2019-02-22 MED ORDER — METOCLOPRAMIDE HCL 5 MG PO TABS: 5.0000 mg | ORAL_TABLET | Freq: Three times a day (TID) | ORAL | 2 refills | Status: DC

## 2019-02-22 NOTE — Patient Instructions (Signed)
Preterm Labor and Birth Information Pregnancy normally lasts 39-41 weeks. Preterm labor is when labor starts early. It starts before you have been pregnant for 37 whole weeks. What are the risk factors for preterm labor? Preterm labor is more likely to occur in women who:  Have an infection while pregnant.  Have a cervix that is short.  Have gone into preterm labor before.  Have had surgery on their cervix.  Are younger than age 20.  Are older than age 82.  Are African American.  Are pregnant with two or more babies.  Take street drugs while pregnant.  Smoke while pregnant.  Do not gain enough weight while pregnant.  Got pregnant right after another pregnancy. What are the symptoms of preterm labor? Symptoms of preterm labor include:  Cramps. The cramps may feel like the cramps some women get during their period. The cramps may happen with watery poop (diarrhea).  Pain in the belly (abdomen).  Pain in the lower back.  Regular contractions or tightening. It may feel like your belly is getting tighter.  Pressure in the lower belly that seems to get stronger.  More fluid (discharge) leaking from the vagina. The fluid may be watery or bloody.  Water breaking. Why is it important to notice signs of preterm labor? Babies who are born early may not be fully developed. They have a higher chance for:  Long-term heart problems.  Long-term lung problems.  Trouble controlling body systems, like breathing.  Bleeding in the brain.  A condition called cerebral palsy.  Learning difficulties.  Death. These risks are highest for babies who are born before 34 weeks of pregnancy. How is preterm labor treated? Treatment depends on:  How long you were pregnant.  Your condition.  The health of your baby. Treatment may involve:  Having a stitch (suture) placed in your cervix. When you give birth, your cervix opens so the baby can come out. The stitch keeps the cervix  from opening too soon.  Staying at the hospital.  Taking or getting medicines, such as: ? Hormone medicines. ? Medicines to stop contractions. ? Medicines to help the babys lungs develop. ? Medicines to prevent your baby from having cerebral palsy. What should I do if I am in preterm labor? If you think you are going into labor too soon, call your doctor right away. How can I prevent preterm labor?  Do not use any tobacco products. ? Examples of these are cigarettes, chewing tobacco, and e-cigarettes. ? If you need help quitting, ask your doctor.  Do not use street drugs.  Do not use any medicines unless you ask your doctor if they are safe for you.  Talk with your doctor before taking any herbal supplements.  Make sure you gain enough weight.  Watch for infection. If you think you might have an infection, get it checked right away.  If you have gone into preterm labor before, tell your doctor. This information is not intended to replace advice given to you by your health care provider. Make sure you discuss any questions you have with your health care provider. Document Released: 07/18/2008 Document Revised: 08/13/2018 Document Reviewed: 09/12/2015 Elsevier Patient Education  2020 [employer]vier Inc.  Hyperemesis Gravidarum Hyperemesis gravidarum is a severe form of nausea and vomiting that happens during pregnancy. Hyperemesis is worse than morning sickness. It may cause you to have nausea or vomiting all day for many days. It may keep you from eating and drinking enough food and liquids, which  can lead to dehydration, malnutrition, and weight loss. Hyperemesis usually occurs during the first half (the first 20 weeks) of pregnancy. It often goes away once a woman is in her second half of pregnancy. However, sometimes hyperemesis continues through an entire pregnancy. What are the causes? The cause of this condition is not known. It may be related to changes in chemicals (hormones) in  the body during pregnancy, such as the high level of pregnancy hormone (human chorionic gonadotropin) or the increase in the female sex hormone (estrogen). What are the signs or symptoms? Symptoms of this condition include:  Nausea that does not go away.  Vomiting that does not allow you to keep any food down.  Weight loss.  Body fluid loss (dehydration).  Having no desire to eat, or not liking food that you have previously enjoyed. How is this diagnosed? This condition may be diagnosed based on:  A physical exam.  Your medical history.  Your symptoms.  Blood tests.  Urine tests. How is this treated? This condition is managed by controlling symptoms. This may include:  Following an eating plan. This can help lessen nausea and vomiting.  Taking prescription medicines. An eating plan and medicines are often used together to help control symptoms. If medicines do not help relieve nausea and vomiting, you may need to receive fluids through an IV at the hospital. Follow these instructions at home: Eating and drinking   Avoid the following: ? Drinking fluids with meals. Try not to drink anything during the 30 minutes before and after your meals. ? Drinking more than 1 cup of fluid at a time. ? Eating foods that trigger your symptoms. These may include spicy foods, coffee, high-fat foods, very sweet foods, and acidic foods. ? Skipping meals. Nausea can be more intense on an empty stomach. If you cannot tolerate food, do not force it. Try sucking on ice chips or other frozen items and make up for missed calories later. ? Lying down within 2 hours after eating. ? Being exposed to environmental triggers. These may include food smells, smoky rooms, closed spaces, rooms with strong smells, warm or humid places, overly loud and noisy rooms, and rooms with motion or flickering lights. Try eating meals in a well-ventilated area that is free of strong smells. ? Quick and sudden changes in  your movement. ? Taking iron pills and multivitamins that contain iron. If you take prescription iron pills, do not stop taking them unless your health care provider approves. ? Preparing food. The smell of food can spoil your appetite or trigger nausea.  To help relieve your symptoms: ? Listen to your body. Everyone is different and has different preferences. Find what works best for you. ? Eat and drink slowly. ? Eat 5-6 small meals daily instead of 3 large meals. Eating small meals and snacks can help you avoid an empty stomach. ? In the morning, before getting out of bed, eat a couple of crackers to avoid moving around on an empty stomach. ? Try eating starchy foods as these are usually tolerated well. Examples include cereal, toast, bread, potatoes, pasta, rice, and pretzels. ? Include at least 1 serving of protein with your meals and snacks. Protein options include lean meats, poultry, seafood, beans, nuts, nut butters, eggs, cheese, and yogurt. ? Try eating a protein-rich snack before bed. Examples of a protein-rick snack include cheese and crackers or a peanut butter sandwich made with 1 slice of whole-wheat bread and 1 tsp (5 g) of peanut  butter. ? Eat or suck on things that have ginger in them. It may help relieve nausea. Add  tsp ground ginger to hot tea or choose ginger tea. ? Try drinking 100% fruit juice or an electrolyte drink. An electrolyte drink contains sodium, potassium, and chloride. ? Drink fluids that are cold, clear, and carbonated or sour. Examples include lemonade, ginger ale, lemon-lime soda, ice water, and sparkling water. ? Brush your teeth or use a mouth rinse after meals. ? Talk with your health care provider about starting a supplement of vitamin B6. General instructions  Take over-the-counter and prescription medicines only as told by your health care provider.  Follow instructions from your health care provider about eating or drinking restrictions.  Continue  to take your prenatal vitamins as told by your health care provider. If you are having trouble taking your prenatal vitamins, talk with your health care provider about different options.  Keep all follow-up and pre-birth (prenatal) visits as told by your health care provider. This is important. Contact a health care provider if:  You have pain in your abdomen.  You have a severe headache.  You have vision problems.  You are losing weight.  You feel weak or dizzy. Get help right away if:  You cannot drink fluids without vomiting.  You vomit blood.  You have constant nausea and vomiting.  You are very weak.  You faint.  You have a fever and your symptoms suddenly get worse. Summary  Hyperemesis gravidarum is a severe form of nausea and vomiting that happens during pregnancy.  Making some changes to your eating habits may help relieve nausea and vomiting.  This condition may be managed with medicine.  If medicines do not help relieve nausea and vomiting, you may need to receive fluids through an IV at the hospital. This information is not intended to replace advice given to you by your health care provider. Make sure you discuss any questions you have with your health care provider. Document Released: 04/21/2005 Document Revised: 05/11/2017 Document Reviewed: 12/19/2015 Elsevier Patient Education  2020 [employer]

## 2019-02-22 NOTE — Progress Notes (Signed)
Subjective:    Amanda Davies is being seen today for her first obstetrical visit.  This is not a planned pregnancy. She is at [redacted]w[redacted]d gestation. Her obstetrical history is significant for previous PTL / Preterm Delivery x 2.  She had an ectopic in 2016 with 1st pregnancy;  2017 had a 35 week delivery; 2018 had a 38 week delivery after getting Makena injections; 2020 had a 35 week delivery. Relationship with FOB: significant other, not living together. Patient does intend to breast feed. Pregnancy history fully reviewed.  The information documented in the HPI was reviewed and verified.  Menstrual History: OB History    Gravida  5   Para  3   Term  1   Preterm  2   AB  1   Living  3     SAB  1   TAB      Ectopic      Multiple  0   Live Births  3            No LMP recorded. Patient is pregnant.    Past Medical History:  Diagnosis Date  . Acne   . Asthma    when younger  . Chlamydia   . Hx of pre-term labor     Past Surgical History:  Procedure Laterality Date  . NO PAST SURGERIES      (Not in a hospital admission)  No Known Allergies  Social History   Tobacco Use  . Smoking status: Never Smoker  . Smokeless tobacco: Never Used  Substance Use Topics  . Alcohol use: No    Family History  Problem Relation Age of Onset  . Anxiety disorder Mother   . Asthma Sister   . Asthma Brother      Review of Systems Constitutional: negative for weight loss Gastrointestinal: negative for vomiting Genitourinary:negative for genital lesions and vaginal discharge and dysuria Musculoskeletal:negative for back pain Behavioral/Psych: negative for abusive relationship, depression, illegal drug usage and tobacco use    Objective:    BP 104/71   Pulse (!) 118   Wt 118 lb (53.5 kg)   BMI 22.30 kg/m  General Appearance:    Alert, cooperative, no distress, appears stated age  Head:    Normocephalic, without obvious abnormality, atraumatic  Eyes:    PERRL,  conjunctiva/corneas clear, EOM's intact, fundi    benign, both eyes  Ears:    Normal TM's and external ear canals, both ears  Nose:   Nares normal, septum midline, mucosa normal, no drainage    or sinus tenderness  Throat:   Lips, mucosa, and tongue normal; teeth and gums normal  Neck:   Supple, symmetrical, trachea midline, no adenopathy;    thyroid:  no enlargement/tenderness/nodules; no carotid   bruit or JVD  Back:     Symmetric, no curvature, ROM normal, no CVA tenderness  Lungs:     Clear to auscultation bilaterally, respirations unlabored  Chest Wall:    No tenderness or deformity   Heart:    Regular rate and rhythm, S1 and S2 normal, no murmur, rub   or gallop  Breast Exam:    No tenderness, masses, or nipple abnormality  Abdomen:     Soft, non-tender, bowel sounds active all four quadrants,    no masses, no organomegaly  Genitalia:    Normal female without lesion, discharge or tenderness  Extremities:   Extremities normal, atraumatic, no cyanosis or edema  Pulses:   2+ and symmetric all extremities  Skin:   Skin color, texture, turgor normal, no rashes or lesions  Lymph nodes:   Cervical, supraclavicular, and axillary nodes normal  Neurologic:   CNII-XII intact, normal strength, sensation and reflexes    throughout      Lab Review Urine pregnancy test Labs reviewed yes Radiologic studies reviewed yes Assessment:    Pregnancy at [redacted]w[redacted]d weeks    Plan:     1. Supervision of high risk pregnancy, antepartum Rx: - Obstetric Panel, Including HIV - Culture, OB Urine - Cervicovaginal ancillary only( [place]) - Enroll Patient in Babyscripts - Babyscripts Schedule Optimization - Genetic Screening  2. Short interval between pregnancies affecting pregnancy, antepartum  3. H/O preterm delivery x 2, currently pregnant  4. History of chlamydia  5. History of GBS (group B streptococcus) UTI, currently pregnant  6. Hyperemesis affecting pregnancy, antepartum Rx: -  metoCLOPramide (REGLAN) 5 MG tablet; Take 1 tablet (5 mg total) by mouth 3 (three) times daily before meals.  Dispense: 30 tablet; Refill: 2  Prenatal vitamins.  Counseling provided regarding continued use of seat belts, cessation of alcohol consumption, smoking or use of illicit drugs; infection precautions i.e., influenza/TDAP immunizations, toxoplasmosis,CMV, parvovirus, listeria and varicella; workplace safety, exercise during pregnancy; routine dental care, safe medications, sexual activity, hot tubs, saunas, pools, travel, caffeine use, fish and methlymercury, potential toxins, hair treatments, varicose veins Weight gain recommendations per IOM guidelines reviewed: underweight/BMI< 18.5--> gain 28 - 40 lbs; normal weight/BMI 18.5 - 24.9--> gain 25 - 35 lbs; overweight/BMI 25 - 29.9--> gain 15 - 25 lbs; obese/BMI >30->gain  11 - 20 lbs Problem list reviewed and updated. FIRST/CF mutation testing/NIPT/QUAD SCREEN/fragile X/Ashkenazi Jewish population testing/Spinal muscular atrophy discussed: requested. Role of ultrasound in pregnancy discussed; fetal survey: requested. Amniocentesis discussed: not indicated.  Meds ordered this encounter  Medications  . metoCLOPramide (REGLAN) 5 MG tablet    Sig: Take 1 tablet (5 mg total) by mouth 3 (three) times daily before meals.    Dispense:  30 tablet    Refill:  2   Orders Placed This Encounter  Procedures  . Culture, OB Urine  . Obstetric Panel, Including HIV  . Genetic Screening    Follow up in 4 weeks for 1st Makena injection and AFP.  50% of 25 min visit spent on counseling and coordination of care.    Brock Bad, MD 02/22/2019 3:13 PM

## 2019-02-23 LAB — OBSTETRIC PANEL, INCLUDING HIV
Antibody Screen: NEGATIVE
Basophils Absolute: 0 10*3/uL (ref 0.0–0.2)
Basos: 0 %
EOS (ABSOLUTE): 0.1 10*3/uL (ref 0.0–0.4)
Eos: 1 %
HIV Screen 4th Generation wRfx: NONREACTIVE
Hematocrit: 41.8 % (ref 34.0–46.6)
Hemoglobin: 13.4 g/dL (ref 11.1–15.9)
Hepatitis B Surface Ag: NEGATIVE
Immature Grans (Abs): 0 10*3/uL (ref 0.0–0.1)
Immature Granulocytes: 0 %
Lymphocytes Absolute: 1.8 10*3/uL (ref 0.7–3.1)
Lymphs: 25 %
MCH: 25 pg — ABNORMAL LOW (ref 26.6–33.0)
MCHC: 32.1 g/dL (ref 31.5–35.7)
MCV: 78 fL — ABNORMAL LOW (ref 79–97)
Monocytes Absolute: 0.7 10*3/uL (ref 0.1–0.9)
Monocytes: 10 %
Neutrophils Absolute: 4.5 10*3/uL (ref 1.4–7.0)
Neutrophils: 64 %
Platelets: 263 10*3/uL (ref 150–450)
RBC: 5.35 x10E6/uL — ABNORMAL HIGH (ref 3.77–5.28)
RDW: 14.2 % (ref 11.7–15.4)
RPR Ser Ql: NONREACTIVE
Rh Factor: POSITIVE
Rubella Antibodies, IGG: 18.5 index (ref >0.99)
WBC: 7.1 10*3/uL (ref 3.4–10.8)

## 2019-02-25 ENCOUNTER — Other Ambulatory Visit: Payer: Self-pay | Admitting: Obstetrics

## 2019-02-25 ENCOUNTER — Telehealth: Payer: Self-pay

## 2019-02-25 DIAGNOSIS — N3000 Acute cystitis without hematuria: Secondary | ICD-10-CM

## 2019-02-25 LAB — URINE CULTURE, OB REFLEX

## 2019-02-25 LAB — CULTURE, OB URINE

## 2019-02-25 MED ORDER — CEFUROXIME AXETIL 500 MG PO TABS: 500.0000 mg | ORAL_TABLET | Freq: Two times a day (BID) | ORAL | 0 refills | Status: DC

## 2019-02-25 NOTE — Telephone Encounter (Signed)
-----   Message from Shelly Bombard, MD sent at 02/25/2019  8:31 AM EDT ----- Ceftin Rx for UTI

## 2019-02-25 NOTE — Telephone Encounter (Signed)
  Left VM of results and RX.

## 2019-03-02 LAB — CERVICOVAGINAL ANCILLARY ONLY
Bacterial Vaginitis (gardnerella): POSITIVE — AB
Candida Glabrata: NEGATIVE
Candida Vaginitis: NEGATIVE
Chlamydia: NEGATIVE
Comment: NEGATIVE
Comment: NEGATIVE
Comment: NEGATIVE
Comment: NEGATIVE
Comment: NEGATIVE
Comment: NORMAL
Neisseria Gonorrhea: NEGATIVE
Trichomonas: NEGATIVE

## 2019-03-03 ENCOUNTER — Other Ambulatory Visit: Payer: Self-pay | Admitting: Obstetrics

## 2019-03-03 ENCOUNTER — Encounter: Payer: Self-pay | Admitting: Obstetrics

## 2019-03-03 DIAGNOSIS — B9689 Other specified bacterial agents as the cause of diseases classified elsewhere: Secondary | ICD-10-CM

## 2019-03-03 MED ORDER — TINIDAZOLE 500 MG PO TABS: 1000.0000 mg | ORAL_TABLET | Freq: Every day | ORAL | 2 refills | Status: DC

## 2019-03-05 ENCOUNTER — Other Ambulatory Visit: Payer: Self-pay

## 2019-03-05 ENCOUNTER — Encounter (HOSPITAL_COMMUNITY): Payer: Self-pay | Admitting: *Deleted

## 2019-03-05 ENCOUNTER — Inpatient Hospital Stay (HOSPITAL_COMMUNITY)
Admission: AD | Admit: 2019-03-05 | Discharge: 2019-03-08 | DRG: 833 | Disposition: A | Discharge status: Home / Self Care | Payer: Medicaid Other | Attending: Obstetrics and Gynecology | Admitting: Obstetrics and Gynecology

## 2019-03-05 DIAGNOSIS — N3000 Acute cystitis without hematuria: Secondary | ICD-10-CM

## 2019-03-05 DIAGNOSIS — O99891 Other specified diseases and conditions complicating pregnancy: Secondary | ICD-10-CM

## 2019-03-05 DIAGNOSIS — N1330 Unspecified hydronephrosis: Secondary | ICD-10-CM | POA: Diagnosis not present

## 2019-03-05 DIAGNOSIS — B9620 Unspecified Escherichia coli [E. coli] as the cause of diseases classified elsewhere: Secondary | ICD-10-CM | POA: Diagnosis present

## 2019-03-05 DIAGNOSIS — R0902 Hypoxemia: Secondary | ICD-10-CM | POA: Diagnosis not present

## 2019-03-05 DIAGNOSIS — M5489 Other dorsalgia: Secondary | ICD-10-CM | POA: Diagnosis not present

## 2019-03-05 DIAGNOSIS — O26891 Other specified pregnancy related conditions, first trimester: Secondary | ICD-10-CM | POA: Diagnosis present

## 2019-03-05 DIAGNOSIS — R000 Tachycardia, unspecified: Secondary | ICD-10-CM | POA: Diagnosis present

## 2019-03-05 DIAGNOSIS — M549 Dorsalgia, unspecified: Secondary | ICD-10-CM

## 2019-03-05 DIAGNOSIS — O2301 Infections of kidney in pregnancy, first trimester: Secondary | ICD-10-CM

## 2019-03-05 DIAGNOSIS — Z20828 Contact with and (suspected) exposure to other viral communicable diseases: Secondary | ICD-10-CM | POA: Diagnosis present

## 2019-03-05 DIAGNOSIS — O26899 Other specified pregnancy related conditions, unspecified trimester: Secondary | ICD-10-CM

## 2019-03-05 DIAGNOSIS — R109 Unspecified abdominal pain: Secondary | ICD-10-CM | POA: Diagnosis present

## 2019-03-05 DIAGNOSIS — O039 Complete or unspecified spontaneous abortion without complication: Secondary | ICD-10-CM | POA: Diagnosis not present

## 2019-03-05 DIAGNOSIS — O2300 Infections of kidney in pregnancy, unspecified trimester: Secondary | ICD-10-CM | POA: Diagnosis present

## 2019-03-05 DIAGNOSIS — Z3A12 12 weeks gestation of pregnancy: Secondary | ICD-10-CM

## 2019-03-05 HISTORY — DX: Infections of kidney in pregnancy, unspecified trimester: O23.00

## 2019-03-05 LAB — URINALYSIS, ROUTINE W REFLEX MICROSCOPIC
Bilirubin Urine: NEGATIVE
Glucose, UA: NEGATIVE mg/dL
Ketones, ur: 20 mg/dL — AB
Nitrite: NEGATIVE
Protein, ur: 100 mg/dL — AB
Specific Gravity, Urine: 1.017 (ref 1.005–1.030)
WBC, UA: >50 WBC/hpf — ABNORMAL HIGH (ref 0–5)
pH: 5 (ref 5.0–8.0)

## 2019-03-05 LAB — BASIC METABOLIC PANEL
Anion gap: 13 (ref 5–15)
BUN: 7 mg/dL (ref 6–20)
CO2: 23 mmol/L (ref 22–32)
Calcium: 9.6 mg/dL (ref 8.9–10.3)
Chloride: 98 mmol/L (ref 98–111)
Creatinine, Ser: 0.8 mg/dL (ref 0.44–1.00)
GFR calc Af Amer: >60 mL/min (ref >60)
GFR calc non Af Amer: >60 mL/min (ref >60)
Glucose, Bld: 91 mg/dL (ref 70–99)
Potassium: 3.6 mmol/L (ref 3.5–5.1)
Sodium: 134 mmol/L — ABNORMAL LOW (ref 135–145)

## 2019-03-05 LAB — CBC
HCT: 44.4 % (ref 36.0–46.0)
Hemoglobin: 14.4 g/dL (ref 12.0–15.0)
MCH: 25.4 pg — ABNORMAL LOW (ref 26.0–34.0)
MCHC: 32.4 g/dL (ref 30.0–36.0)
MCV: 78.2 fL — ABNORMAL LOW (ref 80.0–100.0)
Platelets: 276 10*3/uL (ref 150–400)
RBC: 5.68 MIL/uL — ABNORMAL HIGH (ref 3.87–5.11)
RDW: 14.2 % (ref 11.5–15.5)
WBC: 12.3 10*3/uL — ABNORMAL HIGH (ref 4.0–10.5)
nRBC: 0 % (ref 0.0–0.2)

## 2019-03-05 LAB — SARS CORONAVIRUS 2 BY RT PCR (HOSPITAL ORDER, PERFORMED IN ~~LOC~~ HOSPITAL LAB): SARS Coronavirus 2: NEGATIVE

## 2019-03-05 MED ORDER — ACETAMINOPHEN 325 MG PO TABS
650.0000 mg | ORAL_TABLET | ORAL | Status: DC | PRN
  Administered 2019-03-06 – 2019-03-07 (×5): 650 mg via ORAL
  Filled 2019-03-05 (×6): qty 2

## 2019-03-05 MED ORDER — HYDROMORPHONE HCL 2 MG/ML IJ SOLN
2.0000 mg | Freq: Once | INTRAMUSCULAR | Status: AC
  Administered 2019-03-05: 18:00:00 2 mg via INTRAVENOUS
  Filled 2019-03-05: qty 1

## 2019-03-05 MED ORDER — ZOLPIDEM TARTRATE 5 MG PO TABS: 5.0000 mg | ORAL_TABLET | Freq: Every evening | ORAL | Status: DC | PRN

## 2019-03-05 MED ORDER — SODIUM CHLORIDE 0.9% FLUSH
3.0000 mL | Freq: Two times a day (BID) | INTRAVENOUS | Status: DC
  Administered 2019-03-05 – 2019-03-07 (×4): 3 mL via INTRAVENOUS

## 2019-03-05 MED ORDER — PRENATAL MULTIVITAMIN CH
1.0000 | ORAL_TABLET | Freq: Every day | ORAL | Status: DC
  Administered 2019-03-05 – 2019-03-08 (×4): 1 via ORAL
  Filled 2019-03-05 (×4): qty 1

## 2019-03-05 MED ORDER — CALCIUM CARBONATE ANTACID 500 MG PO CHEW
2.0000 | CHEWABLE_TABLET | ORAL | Status: DC | PRN
  Administered 2019-03-05 – 2019-03-06 (×2): 400 mg via ORAL
  Filled 2019-03-05 (×2): qty 2

## 2019-03-05 MED ORDER — SODIUM CHLORIDE 0.9 % IV SOLN
1.0000 g | INTRAVENOUS | Status: AC
  Administered 2019-03-05 – 2019-03-06 (×2): 1 g via INTRAVENOUS
  Filled 2019-03-05 (×2): qty 1

## 2019-03-05 MED ORDER — SODIUM CHLORIDE 0.9 % IV SOLN
Freq: Once | INTRAVENOUS | Status: AC
  Administered 2019-03-05: 08:00:00 via INTRAVENOUS

## 2019-03-05 MED ORDER — OXYCODONE-ACETAMINOPHEN 5-325 MG PO TABS
1.0000 | ORAL_TABLET | Freq: Four times a day (QID) | ORAL | Status: DC | PRN
  Administered 2019-03-05 – 2019-03-06 (×4): 1 via ORAL
  Filled 2019-03-05 (×4): qty 1

## 2019-03-05 MED ORDER — OXYCODONE HCL 5 MG PO TABS
5.0000 mg | ORAL_TABLET | Freq: Once | ORAL | Status: AC
  Administered 2019-03-05: 5 mg via ORAL
  Filled 2019-03-05: qty 1

## 2019-03-05 MED ORDER — LACTATED RINGERS IV BOLUS
1000.0000 mL | Freq: Once | INTRAVENOUS | Status: AC
  Administered 2019-03-05: 1000 mL via INTRAVENOUS

## 2019-03-05 MED ORDER — DOCUSATE SODIUM 100 MG PO CAPS
100.0000 mg | ORAL_CAPSULE | Freq: Every day | ORAL | Status: DC
  Administered 2019-03-05 – 2019-03-08 (×4): 100 mg via ORAL
  Filled 2019-03-05 (×4): qty 1

## 2019-03-05 MED ORDER — DIPHENHYDRAMINE HCL 25 MG PO CAPS
25.0000 mg | ORAL_CAPSULE | Freq: Four times a day (QID) | ORAL | Status: DC | PRN
  Administered 2019-03-05 – 2019-03-07 (×4): 25 mg via ORAL
  Filled 2019-03-05 (×4): qty 1

## 2019-03-05 MED ORDER — ONDANSETRON HCL 4 MG/2ML IJ SOLN
4.0000 mg | Freq: Once | INTRAMUSCULAR | Status: AC
  Administered 2019-03-05: 4 mg via INTRAVENOUS
  Filled 2019-03-05: qty 2

## 2019-03-05 MED ORDER — DIPHENHYDRAMINE HCL 50 MG/ML IJ SOLN: 25.0000 mg | Freq: Four times a day (QID) | INTRAMUSCULAR | Status: DC | PRN

## 2019-03-05 NOTE — MAU Note (Signed)
Pt reports to MAU via EMS c/o right flank pain that radiates to her lower abdomen. Pt has CVA tenderness on exam on the right side. Pt reports frequency and urgency but denies any burning on urination. No bleeding or LOF.

## 2019-03-05 NOTE — Plan of Care (Signed)
Discussed with patient to drink 6-8 8 oz glasses of water a day to help make her amniotic fluid,stay hydrated,and keep her kidneys flushed out.This help her to prevent more kidney infections.

## 2019-03-05 NOTE — H&P (Signed)
Chief Complaint: Back Pain and Abdominal Pain  First Provider Initiated Contact with Patient 03/05/19 0602     SUBJECTIVE HPI: Amanda Davies is a 18 y.o. [id number] at 8w2dwho presents to maternity admissions by EMS reporting right-sided back/flank pain. Patient reports chronic back pain in lower back for which she takes flexeril and this normally helps. Over past two days, reports right-sided back pain that is different from normal pain and does not respond to flexeril. Pain radiates to RLQ. Rating pain at 10/10. Urinary frequency without hematuria, dysuria. Denies diarrhea, constipation. Patient diagnosed with UTI after office visit on 10/20; script sent to pharmacy. Patient unaware and never picked this up. She denies vaginal bleeding, vaginal itching/burning, h/a, dizziness, n/v, or fever/chills.    Past Medical History:  Diagnosis Date  . Acne   . Asthma    when younger  . Chlamydia   . Hx of pre-term labor    Past Surgical History:  Procedure Laterality Date  . NO PAST SURGERIES     Social History   Socioeconomic History  . Marital status: Single    Spouse name: Not on file  . Number of children: 2  . Years of education: 173 . Highest education level: High school graduate  Occupational History  . Not on file  Social Needs  . Financial resource strain: Not on file  . Food insecurity    Worry: Not on file    Inability: Not on file  . Transportation needs    Medical: Not on file    Non-medical: Not on file  Tobacco Use  . Smoking status: Never Smoker  . Smokeless tobacco: Never Used  Substance and Sexual Activity  . Alcohol use: No  . Drug use: No  . Sexual activity: Not Currently    Partners: Male    Birth control/protection: None  Lifestyle  . Physical activity    Days per week: Not on file    Minutes per session: Not on file  . Stress: Not on file  Relationships  . Social [place]: Not on file    Gets together: Not on file    Attends  religious service: Not on file    Active member of club or organization: Not on file    Attends meetings of clubs or organizations: Not on file    Relationship status: Not on file  . Intimate partner violence    Fear of current or ex partner: No    Emotionally abused: No    Physically abused: No    Forced sexual activity: No  Other Topics Concern  . Not on file  Social History Narrative  . Not on file   No current facility-administered medications on file prior to encounter.    Current Outpatient Medications on File Prior to Encounter  Medication Sig Dispense Refill  . cyclobenzaprine (FLEXERIL) 10 MG tablet Take 1 tablet (10 mg total) by mouth 2 (two) times daily as needed for muscle spasms. 20 tablet 0  . famotidine (PEPCID) 20 MG tablet Take 1 tablet (20 mg total) by mouth 2 (two) times daily. 30 tablet 0  . ondansetron (ZOFRAN) 4 MG tablet Take 1-2 tablets (4-8 mg total) by mouth every 8 (eight) hours as needed for nausea or vomiting. 30 tablet 1  . Blood Pressure Monitoring (BLOOD PRESSURE KIT) DEVI 1 Device by Does not apply route as needed. 1 Device 0  . cefUROXime (CEFTIN) 500 MG tablet Take 1 tablet (500  mg total) by mouth 2 (two) times daily with a meal. 14 tablet 0  . Doxylamine-Pyridoxine (DICLEGIS) 10-10 MG TBEC Take 2 tablets by mouth at bedtime. If symptoms persist, add one tablet in the morning and one in the afternoon (Patient not taking: Reported on 02/22/2019) 100 tablet 5  . metoCLOPramide (REGLAN) 5 MG tablet Take 1 tablet (5 mg total) by mouth 3 (three) times daily before meals. 30 tablet 2  . Prenatal Vit-Fe Phos-FA-Omega (VITAFOL GUMMIES) 3.33-0.333-34.8 MG CHEW Chew 1 Dose by mouth daily. 90 tablet 4  . senna-docusate (SENOKOT-S) 8.6-50 MG tablet Take 2 tablets by mouth daily. (Patient not taking: Reported on 02/22/2019) 20 tablet 0  . tinidazole (TINDAMAX) 500 MG tablet Take 2 tablets (1,000 mg total) by mouth daily with breakfast. 10 tablet 2   No Known  Allergies  ROS:  Review of Systems All other systems negative unless noted above in HPI.   I have reviewed patient's Past Medical Hx, Surgical Hx, Family Hx, Social Hx, medications and allergies.   Physical Exam   Patient Vitals for the past 24 hrs:  BP Temp Temp src Pulse Resp  03/05/19 0650 - - - (!) 121 -  03/05/19 0559 111/69 97.9 F (36.6 C) Oral (!) 120 19   Constitutional: Well-developed, well-nourished female in no acute distress.  Cardiovascular: tachycardic  Respiratory: normal effort GI: Abd soft, mild TTP over entire right side of abdomen and right suprapubic region. MS: Extremities nontender, no edema, normal ROM Neurologic: Alert and oriented x 4.  GU: Notable right CVA tenderness. No left CVA tenderness.  FHT 170 by doppler  LAB RESULTS Results for orders placed or performed during the hospital encounter of 03/05/19 (from the past 24 hour(s))  Urinalysis, Routine w reflex microscopic     Status: Abnormal   Collection Time: 03/05/19  6:01 AM  Result Value Ref Range   Color, Urine AMBER (A) YELLOW   APPearance CLOUDY (A) CLEAR   Specific Gravity, Urine 1.017 1.005 - 1.030   pH 5.0 5.0 - 8.0   Glucose, UA NEGATIVE NEGATIVE mg/dL   Hgb urine dipstick SMALL (A) NEGATIVE   Bilirubin Urine NEGATIVE NEGATIVE   Ketones, ur 20 (A) NEGATIVE mg/dL   Protein, ur 100 (A) NEGATIVE mg/dL   Nitrite NEGATIVE NEGATIVE   Leukocytes,Ua LARGE (A) NEGATIVE   RBC / HPF 11-20 0 - 5 RBC/hpf   WBC, UA >50 (H) 0 - 5 WBC/hpf   Bacteria, UA MANY (A) NONE SEEN   Squamous Epithelial / LPF 21-50 0 - 5   WBC Clumps PRESENT    Mucus PRESENT   CBC     Status: Abnormal   Collection Time: 03/05/19  6:26 AM  Result Value Ref Range   WBC 12.3 (H) 4.0 - 10.5 K/uL   RBC 5.68 (H) 3.87 - 5.11 MIL/uL   Hemoglobin 14.4 12.0 - 15.0 g/dL   HCT 44.4 36.0 - 46.0 %   MCV 78.2 (L) 80.0 - 100.0 fL   MCH 25.4 (L) 26.0 - 34.0 pg   MCHC 32.4 30.0 - 36.0 g/dL   RDW 14.2 11.5 - 15.5 %   Platelets 276  150 - 400 K/uL   nRBC 0.0 0.0 - 0.2 %    O/Positive/-- (10/20 1511)  IMAGING No results found.  MAU Management/MDM: Orders Placed This Encounter  Procedures  . Urine Culture  . Urinalysis, Routine w reflex microscopic  . CBC  . Basic metabolic panel    Meds ordered this encounter  Medications  . oxyCODONE (Oxy IR/ROXICODONE) immediate release tablet 5 mg  . ondansetron (ZOFRAN) injection 4 mg  . lactated ringers bolus 1,000 mL     ASSESSMENT 1. Back pain affecting pregnancy in first trimester   2. Abdominal pain affecting pregnancy   3.      Pyelonephritis affecting pregnancy   MDM Concern for pyelonephritis. Patient tachycardic with significant right CVA tenderness in setting of known untreated UTI. Will check UA, BMP, CBC. Less likely appendicitis or other abdominal pathology as patient afebrile without nausea, vomiting, diarrhea, constipation. Recent STD testing on 10/20 negative. Patient without obstetric complaints. MSK possible but also less likely as patient denies recent injury and that pain is different from normal back pain and not responsive to Flexeril as usual. Oxycodone 5 mg given for pain. Saline lock placed in case of IVF or IV abx. Encouraged PO intake as patient not nauseous.  Patient with some nausea with fluids; Zofran given. UA with large leuks, WBC > 50. Neg Nitrite. Culture and Sensitivity ordered. Culture on 10/20 with E. Coli and sensitive to all with exception of Ampicillin.  WBC 12.3 from 7.1 on 10/20 1 L LR given due to tachycardia to 120 during first hour.  PLAN Admit to [facility] due to SIRS criteria (tachycardia and WBC) with known source accompanied by exquisite CVA tenderness.  Will order Rocephin 1 mg q24h x 2 doses Repeat AM CBC Plan for DC 11/1 if tachycardia, WBC and symptoms improving. Potential transition to PO Cefpodoxime 200 mg BID x 10 days.   Barrington Ellison, MD [facility] Family Medicine Fellow, [facility] for [facility] [place] [facility] 03/05/2019  7:17 AM

## 2019-03-05 NOTE — Progress Notes (Signed)
Dr. Rip Harbour notified of patient stating " my pain is worse and the Percocet didn't help."  Orders received for Dilaudid 2mg  IV x1 dose.

## 2019-03-06 LAB — CBC
HCT: 33.8 % — ABNORMAL LOW (ref 36.0–46.0)
Hemoglobin: 11 g/dL — ABNORMAL LOW (ref 12.0–15.0)
MCH: 25.6 pg — ABNORMAL LOW (ref 26.0–34.0)
MCHC: 32.5 g/dL (ref 30.0–36.0)
MCV: 78.8 fL — ABNORMAL LOW (ref 80.0–100.0)
Platelets: 192 10*3/uL (ref 150–400)
RBC: 4.29 MIL/uL (ref 3.87–5.11)
RDW: 14.4 % (ref 11.5–15.5)
WBC: 10.3 10*3/uL (ref 4.0–10.5)
nRBC: 0 % (ref 0.0–0.2)

## 2019-03-06 MED ORDER — SODIUM CHLORIDE 0.9 % IV SOLN
INTRAVENOUS | Status: DC | PRN
  Administered 2019-03-06: 08:00:00 1000 mL via INTRAVENOUS

## 2019-03-06 MED ORDER — ONDANSETRON HCL 4 MG PO TABS
4.0000 mg | ORAL_TABLET | Freq: Four times a day (QID) | ORAL | Status: DC | PRN
  Administered 2019-03-06 – 2019-03-08 (×5): 4 mg via ORAL
  Filled 2019-03-06 (×5): qty 1

## 2019-03-06 MED ORDER — ONDANSETRON HCL 4 MG/2ML IJ SOLN: 4.0000 mg | Freq: Four times a day (QID) | INTRAMUSCULAR | Status: DC

## 2019-03-06 MED ORDER — METRONIDAZOLE 500 MG PO TABS: 500.0000 mg | ORAL_TABLET | Freq: Two times a day (BID) | ORAL | Status: DC

## 2019-03-06 MED ORDER — OXYCODONE HCL 5 MG PO TABS
5.0000 mg | ORAL_TABLET | ORAL | Status: DC | PRN
  Administered 2019-03-06 – 2019-03-08 (×6): 10 mg via ORAL
  Administered 2019-03-08: 04:00:00 5 mg via ORAL
  Filled 2019-03-06 (×5): qty 2
  Filled 2019-03-06: qty 1
  Filled 2019-03-06: qty 2

## 2019-03-06 MED ORDER — METRONIDAZOLE 500 MG PO TABS
500.0000 mg | ORAL_TABLET | Freq: Two times a day (BID) | ORAL | Status: DC
  Administered 2019-03-06 – 2019-03-08 (×5): 500 mg via ORAL
  Filled 2019-03-06 (×5): qty 1

## 2019-03-06 MED ORDER — SODIUM CHLORIDE 0.9 % IV SOLN
1.0000 g | INTRAVENOUS | Status: DC
  Administered 2019-03-07 – 2019-03-08 (×2): 1 g via INTRAVENOUS
  Filled 2019-03-06: qty 1
  Filled 2019-03-06: qty 10
  Filled 2019-03-06: qty 1

## 2019-03-06 MED ORDER — ONDANSETRON HCL 4 MG/2ML IJ SOLN: 4.0000 mg | Freq: Four times a day (QID) | INTRAMUSCULAR | Status: DC | PRN

## 2019-03-06 NOTE — Plan of Care (Signed)
  Problem: Clinical Measurements: Goal: Ability to maintain clinical measurements within normal limits will improve Outcome: Completed/Met   

## 2019-03-06 NOTE — Progress Notes (Signed)
Patient ID: Amanda Davies, female   DOB: 02-16-2001, 18 y.o.   MRN: [id number] [place] ANTEPARTUM COMPREHENSIVE PROGRESS NOTE  Amanda Davies is a 18 y.o. [id number] at [redacted]w[redacted]d  who is admitted for pyelonephritis.   Fetal presentation is unsure. Length of Stay:  1  Days  Subjective: Pt reports feeling a little better. Pain has decreased some. Tolerating diet. No VB or LOF,  Vitals:  Blood pressure (!) 101/59, pulse (!) 104, temperature 98.1 F (36.7 C), temperature source Oral, resp. rate 17, height 5\' 1"  (1.549 m), weight 53.5 kg, SpO2 100 %, unknown if currently breastfeeding.   Physical Examination: Lungs clear Heart RRR Abd soft + BS  Back right CVA tenderness Ext non tedner   Fetal Monitoring:  + FHT's  Labs:  Results for orders placed or performed during the hospital encounter of 03/05/19 (from the past 24 hour(s))  SARS Coronavirus 2 by RT PCR (hospital order, performed in [facility] hospital lab) Nasopharyngeal Nasopharyngeal Swab   Collection Time: 03/05/19  7:37 AM   Specimen: Nasopharyngeal Swab  Result Value Ref Range   SARS Coronavirus 2 NEGATIVE NEGATIVE  CBC   Collection Time: 03/06/19  5:00 AM  Result Value Ref Range   WBC 10.3 4.0 - 10.5 K/uL   RBC 4.29 3.87 - 5.11 MIL/uL   Hemoglobin 11.0 (L) 12.0 - 15.0 g/dL   HCT 33.8 (L) 36.0 - 46.0 %   MCV 78.8 (L) 80.0 - 100.0 fL   MCH 25.6 (L) 26.0 - 34.0 pg   MCHC 32.5 30.0 - 36.0 g/dL   RDW 14.4 11.5 - 15.5 %   Platelets 192 150 - 400 K/uL   nRBC 0.0 0.0 - 0.2 %    Imaging Studies:    NA   Medications:  Scheduled . docusate sodium  100 mg Oral Daily  . ondansetron (ZOFRAN) IV  4 mg Intravenous Q6H  . prenatal multivitamin  1 tablet Oral Q1200  . sodium chloride flush  3 mL Intravenous Q12H   I have reviewed the patient's current medications.  ASSESSMENT: IUP 12 3/7 weeks Pyelonephritis H/O PTL and PTD  PLAN: Improving. Remains afebrile. Continue with current management   Chancy Milroy 03/06/2019,6:42 AM

## 2019-03-07 ENCOUNTER — Inpatient Hospital Stay (HOSPITAL_COMMUNITY): Payer: Medicaid Other

## 2019-03-07 ENCOUNTER — Encounter: Payer: Self-pay | Admitting: Obstetrics

## 2019-03-07 LAB — URINE CULTURE: Culture: >=100000 — AB

## 2019-03-07 NOTE — Progress Notes (Signed)
S. She is still having some pain, slightly better than yesterday.  O. VSS, AF  Previous UC&S from 2 weeks ago showed EColi.  Heart- rrr Lungs- CTAB Abd- benign Back- + right CVAT  A/P. Pyelonephritis at 12.[redacted] weeks EGA- continue IV rocephin Renal u/s ordered

## 2019-03-08 MED ORDER — SULFAMETHOXAZOLE-TRIMETHOPRIM 800-160 MG PO TABS: 1.0000 | ORAL_TABLET | Freq: Two times a day (BID) | ORAL | 0 refills | Status: DC

## 2019-03-08 NOTE — Discharge Instructions (Signed)
Pyelonephritis During Pregnancy ° °What are the causes? °This condition is caused by a bacterial infection in the lower urinary tract that spreads to the kidney. °What increases the risk? °You are more likely to develop this condition if: °· You have diabetes. °· You have a history of frequent urinary tract infections. °What are the signs or symptoms? °Symptoms of this condition may begin with symptoms of a lower urinary tract infection. These may include: °· A frequent urge to pass urine. °· Burning pain when passing urine. °· Pain and pressure in your lower abdomen. °· Blood in your urine. °· Cloudy or smelly urine. °As the infection spreads to your kidney, you may have these symptoms: °· Fever. °· Chills. °· Pain and tenderness in your upper abdomen or in your back and sides (flank pain). Flank pain often affects one side of the body, usually the right side. °· Nausea, vomiting, or loss of appetite. °How is this diagnosed? °This condition may be diagnosed based on: °· Your symptoms and medical history. °· A physical exam. °· Tests to confirm the diagnosis. These may include: °? Blood tests to check kidney function and look for signs of infection. °? Urine tests to check for signs of infection, including bacteria, white or red blood cells, and protein. °? Tests to grow and identify the type of bacteria that is causing the infection (urine culture). °? Imaging studies of your kidneys to learn more about your condition. °How is this treated? °This condition is treated in the hospital with antibiotics that are given into one of your veins through an IV. Your health care provider: °· Will start you on an antibiotic that is effective against common urinary tract infections. °· May switch to another antibiotic if the results of your urine culture show that your infection is caused by different bacteria. °· Will be careful to choose antibiotics that are the safest during pregnancy. °Other treatments may include: °· IV  fluids if you are nauseous and not able to drink fluids. °· Pain and fever medicines. °· Medicines for nausea and vomiting. °You will be able to go home when your infection is under control. To prevent another infection, you may need to continue taking antibiotics by mouth until your baby is born. °Follow these instructions at home: °Medicines ° °· Take over-the-counter and prescription medicines only as told by your health care provider. °· Take your antibiotic medicine as told by your health care provider. Do not stop taking the antibiotic even if you start to feel better. °· Continue to take your prenatal vitamins. °Lifestyle ° °· Follow instructions from your health care provider about eating or drinking restrictions. You may need to avoid: °? Foods and drinks with added sugar. °? Caffeine and fruit juice. °· Drink enough fluid to keep your urine pale yellow. °· Go to the bathroom frequently. Do not hold your urine. Try to empty your bladder completely. °· Change your underwear every day. Wear all-cotton underwear. Do not wear tight underwear or pants. °General instructions ° °· Take these steps to lower the risk of bacteria getting into your urinary tract: °? Use liquid soap instead of bar soap when showering or bathing. Bacteria can grow on bar soap. °? When you wash yourself, clean the urethra opening first. Use a washcloth to clean the area between your vagina and anus. Pat the area dry with a clean towel. °? Wash your hands before and after you go to the bathroom. °? Wipe yourself from front to back   after going to the bathroom. °? Do not use douches, perfumed soap, creams, or powders. °? Do not soak in a bath for more than 30 minutes. °· Return to your normal activities as told by your health care provider. Ask your health care provider what activities are safe for you. °· Keep all follow-up visits as told by your health care provider. This is important. °Contact a health care provider if: °· You have  chills or a fever. °· You have any symptoms of infection that do not get better at home. °· Symptoms of infection come back. °· You have a reaction or side effects from your antibiotic. °Get help right away if: °· You start having contractions. °Summary °· Pyelonephritis is an infection of the kidney or kidneys. °· This condition results when a bacterial infection in the lower urinary tract spreads to the kidney. °· Lower urinary tract infections are common during pregnancy. °· Pyelonephritis causes chills, a fever, flank pain, and nausea. °· Pyelonephritis is a serious infection that is usually treated in the hospital with IV antibiotics. °This information is not intended to replace advice given to you by your health care provider. Make sure you discuss any questions you have with your health care provider. °Document Released: 07/23/2017 Document Revised: 08/13/2018 Document Reviewed: 07/23/2017 °Elsevier Patient Education © 2020 [employer] ° °

## 2019-03-08 NOTE — Progress Notes (Signed)
Discharge instructions and prescriptions given to pt. Discussed signs and symptoms to report to the MD, upcoming appointments, and meds. Pt verbalizes understanding and has no questions or concerns at this time. Pt discharged from hospital in stable condition. 

## 2019-03-08 NOTE — Discharge Summary (Signed)
Physician Discharge Summary  Patient ID: Amanda Davies MRN: [id number] DOB/AGE: 2000-08-05 18 y.o.  Admit date: 03/05/2019 Discharge date: 03/08/2019  Admission Diagnoses:[redacted] week gestation, pyelonephritis  Discharge Diagnoses:  Active Problems:   Pyelonephritis affecting pregnancy [redacted]w[redacted]d  Discharged Condition: good   Hospital Course: HPI: Amanda Corporanis a 18y.o. [id number] presents to maternity admissions by EMS reporting right-sided back/flank pain. Patient reports chronic back pain in lower back for which she takes flexeril and this normally helps. Over past two days, reports right-sided back pain that is different from normal pain and does not respond to flexeril. Pain radiates to RLQ. Rating pain at 10/10. Urinary frequency without hematuria, dysuria. Denies diarrhea, constipation. Patient diagnosed with UTI after office visit on 10/20; script sent to pharmacy. Patient unaware and never picked this up. She denies vaginal bleeding, vaginal itching/burning, h/a, dizziness, n/v, or fever/chills.   She was afebrile during her hospitalization and received IV Rocephin for E. Coli pyelonephritis.   Consults: None  Significant Diagnostic Studies: microbiology: urine culture: positive for e.coli  Treatments: IV hydration and antibiotics: ceftriaxone  Discharge Exam: Blood pressure 109/64, pulse 100, temperature 98.4 F (36.9 C), temperature source Oral, resp. rate 18, height '5\' 1"'  (1.549 m), weight 53.5 kg, SpO2 100 %, unknown if currently breastfeeding. General appearance: alert, cooperative and no distress GI: soft, non-tender; bowel sounds normal; no masses,  no organomegaly and no CVAT Extremities: extremities normal, atraumatic, no cyanosis or edema  Disposition: Discharge disposition: 01-Home or Self Care       Discharge Instructions    Discharge patient   Complete by: As directed    Discharge disposition: 01-Home or Self Care   Discharge patient date:  03/08/2019     Allergies as of 03/08/2019   No Known Allergies     Medication List    STOP taking these medications   cefUROXime 500 MG tablet Commonly known as: Ceftin   Doxylamine-Pyridoxine 10-10 MG Tbec Commonly known as: Diclegis   senna-docusate 8.6-50 MG tablet Commonly known as: Senokot-S   tinidazole 500 MG tablet Commonly known as: Tindamax     TAKE these medications   Blood Pressure Kit Devi 1 Device by Does not apply route as needed.   cyclobenzaprine 10 MG tablet Commonly known as: FLEXERIL Take 1 tablet (10 mg total) by mouth 2 (two) times daily as needed for muscle spasms.   famotidine 20 MG tablet Commonly known as: PEPCID Take 1 tablet (20 mg total) by mouth 2 (two) times daily.   metoCLOPramide 5 MG tablet Commonly known as: Reglan Take 1 tablet (5 mg total) by mouth 3 (three) times daily before meals.   ondansetron 4 MG tablet Commonly known as: ZOFRAN Take 1-2 tablets (4-8 mg total) by mouth every 8 (eight) hours as needed for nausea or vomiting.   sulfamethoxazole-trimethoprim 800-160 MG tablet Commonly known as: BACTRIM DS Take 1 tablet by mouth 2 (two) times daily.   Vitafol Gummies 3.33-0.333-34.8 MG Chew Chew 1 Dose by mouth daily.      Follow-up Information    [facility]   Specialty: Obstetrics and Gynecology Contact information: [id number]        Signed: JaEmeterio Reeve1/07/2018, 9:36 AM

## 2019-03-10 ENCOUNTER — Encounter: Payer: Self-pay | Admitting: Family Medicine

## 2019-03-16 ENCOUNTER — Encounter: Payer: Self-pay | Admitting: Obstetrics

## 2019-03-22 IMAGING — US US OB < 14 WEEKS - US OB TV
1 series · 15 of 28 positions shown · non-contrast
Comparison: None.

CLINICAL DATA: Pregnant patient with abdominal pain.

EXAM:
OBSTETRIC <14 WK US AND TRANSVAGINAL OB US
TECHNIQUE: Both transabdominal and transvaginal ultrasound examinations were
performed for complete evaluation of the gestation as well as the
maternal uterus, adnexal regions, and pelvic cul-de-sac.
Transvaginal technique was performed to assess early pregnancy.

[Series 1: us ob < 14 weeks - us ob tv · 30 acquisitions, 15 frames shown]
[im 1/30]
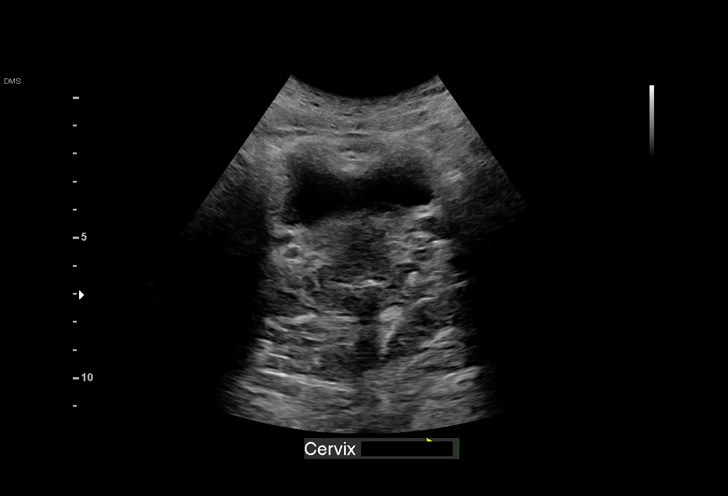
[im 3/30]
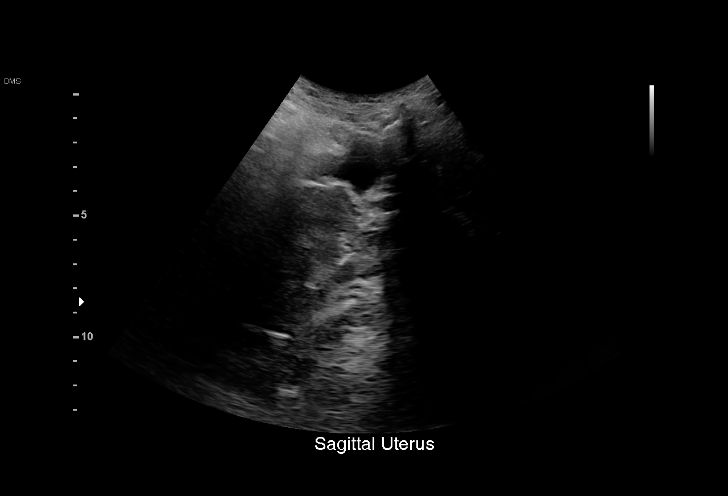
[im 5/30]
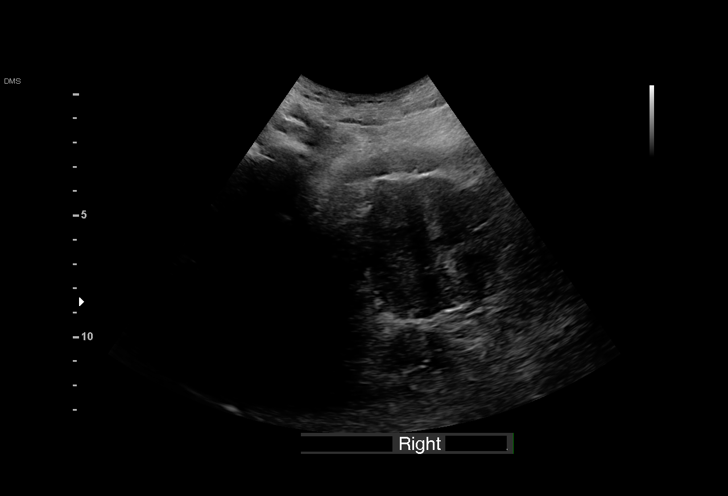
[im 7/30]
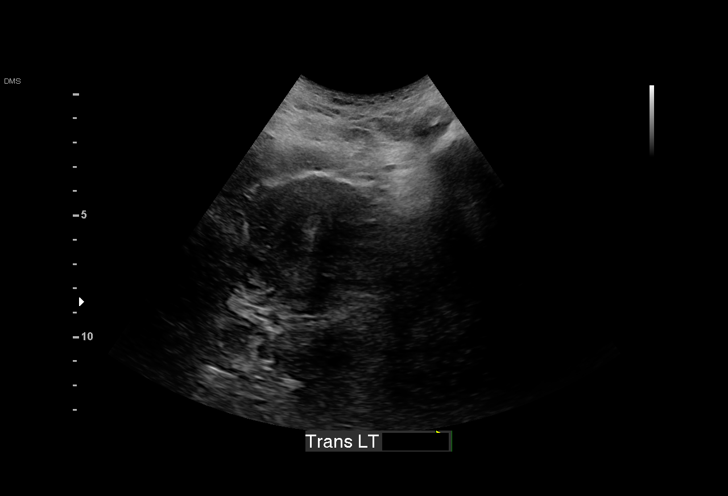
[im 9/30]
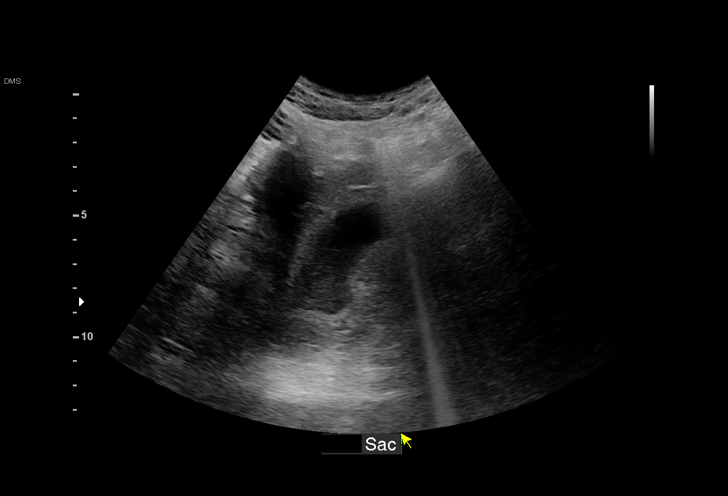
[im 11/30]
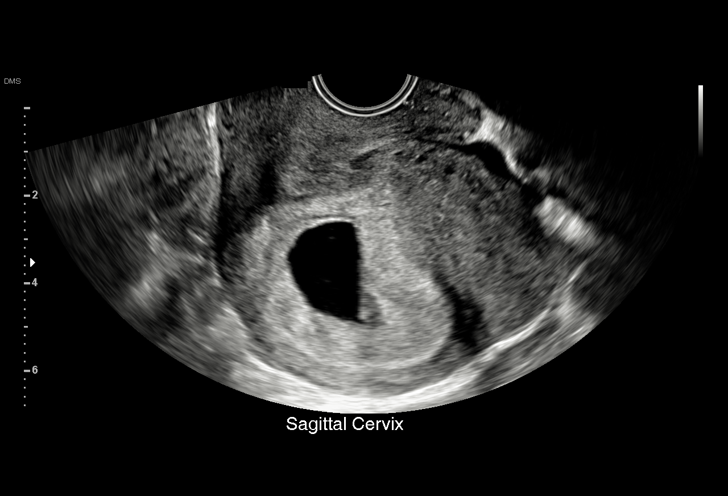
[im 13/30]
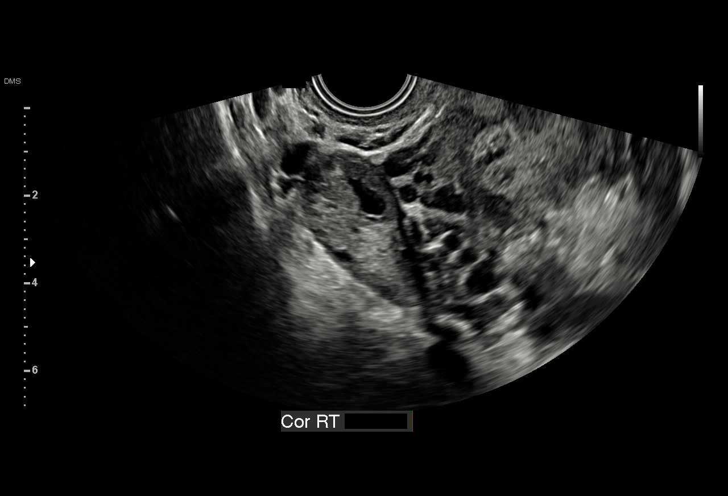
[im 16/30]
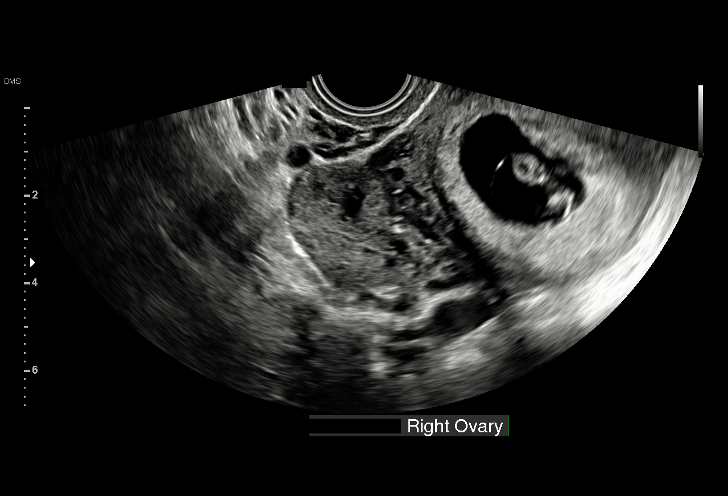
[im 17/30]
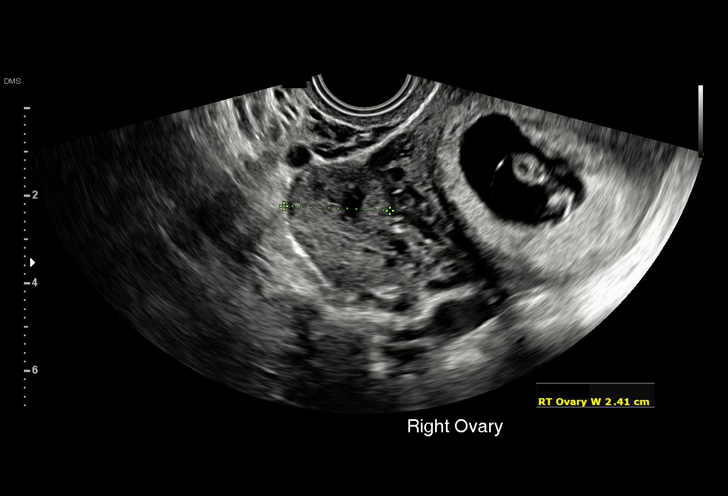
[im 19/30]
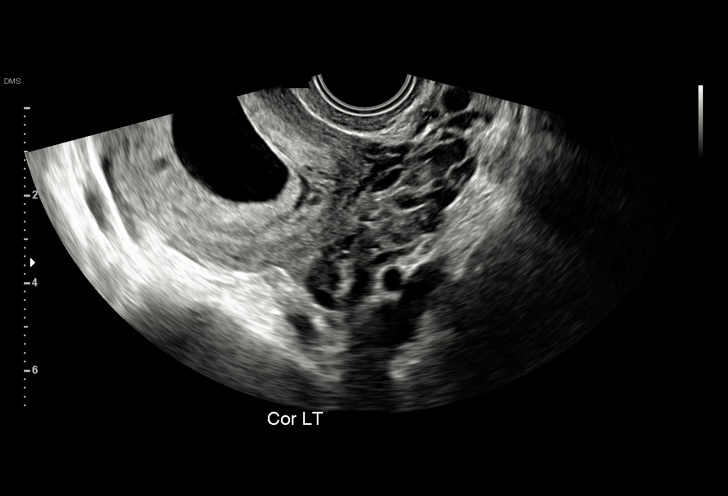
[im 21/30]
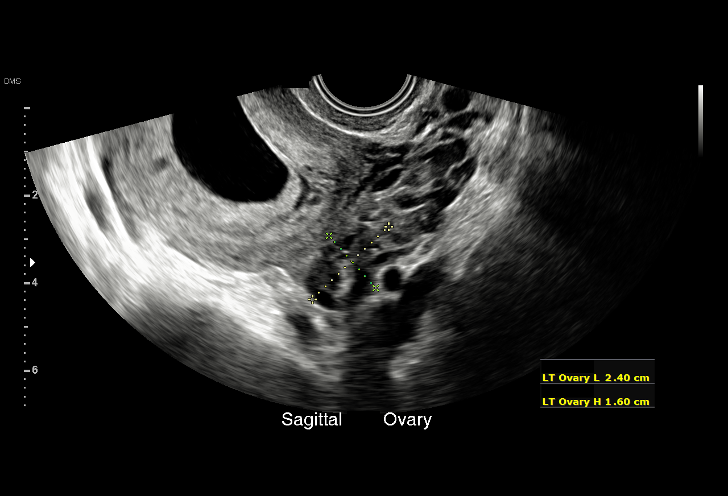
[im 23/30]
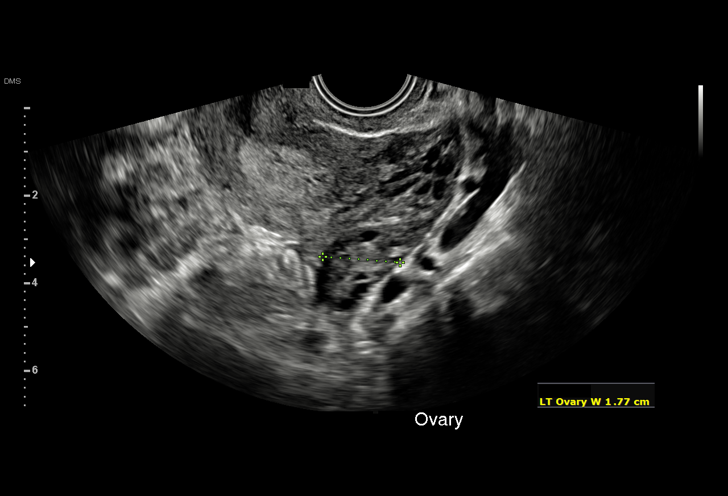
[im 25/30]
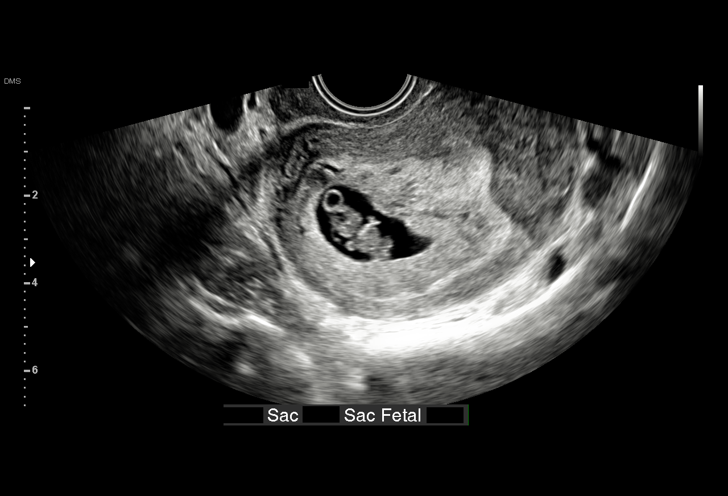
[im 27/30]
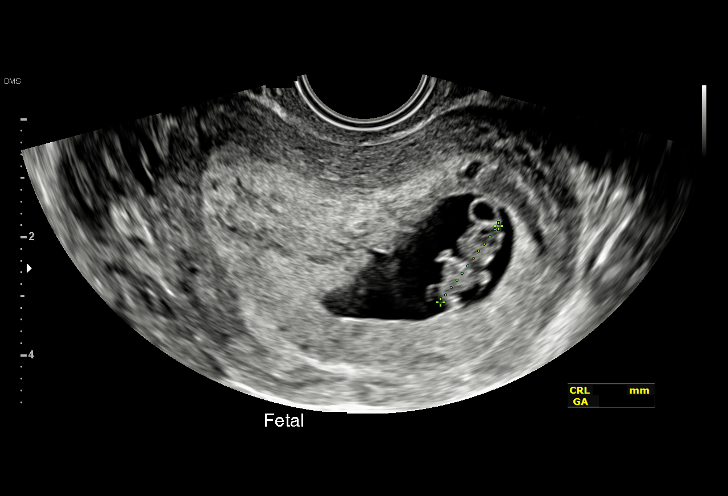
[im 30/30]
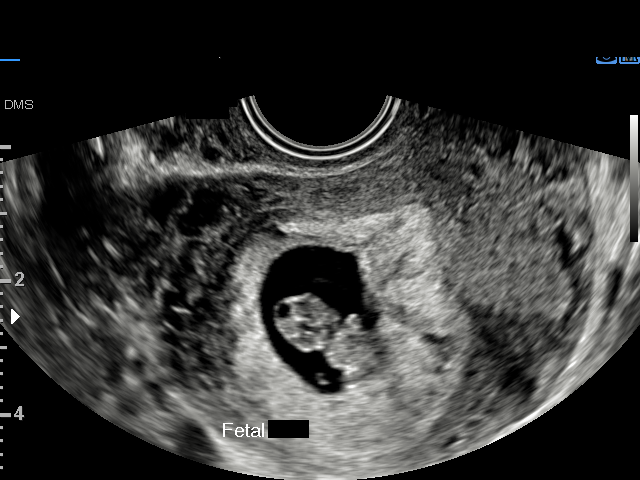

[15 of 28 positions shown; findings below may reference images not displayed]

FINDINGS: Intrauterine gestational sac: Single

Yolk sac:  Visualized.

Embryo:  Visualized.

Cardiac Activity: Visualized.

Heart Rate: 169 bpm

CRL:  16.2 mm   8 w   0 d                  US EDC: 08/12/2018

Subchorionic hemorrhage:  None visualized.

Maternal uterus/adnexae: Normal right and left ovaries. Trace fluid
in the pelvis.
IMPRESSION: Single live intrauterine gestation.  No subchorionic hemorrhage.

## 2019-03-24 ENCOUNTER — Ambulatory Visit (INDEPENDENT_AMBULATORY_CARE_PROVIDER_SITE_OTHER): Payer: Medicaid Other | Admitting: Women's Health

## 2019-03-24 DIAGNOSIS — Z3A15 15 weeks gestation of pregnancy: Secondary | ICD-10-CM

## 2019-03-24 DIAGNOSIS — D563 Thalassemia minor: Secondary | ICD-10-CM | POA: Insufficient documentation

## 2019-03-24 DIAGNOSIS — O2302 Infections of kidney in pregnancy, second trimester: Secondary | ICD-10-CM | POA: Diagnosis not present

## 2019-03-24 DIAGNOSIS — O0990 Supervision of high risk pregnancy, unspecified, unspecified trimester: Secondary | ICD-10-CM

## 2019-03-24 DIAGNOSIS — O09892 Supervision of other high risk pregnancies, second trimester: Secondary | ICD-10-CM

## 2019-03-24 DIAGNOSIS — O0992 Supervision of high risk pregnancy, unspecified, second trimester: Secondary | ICD-10-CM

## 2019-03-24 DIAGNOSIS — O09899 Supervision of other high risk pregnancies, unspecified trimester: Secondary | ICD-10-CM

## 2019-03-24 DIAGNOSIS — O2301 Infections of kidney in pregnancy, first trimester: Secondary | ICD-10-CM

## 2019-03-24 MED ORDER — CYCLOBENZAPRINE HCL 10 MG PO TABS: 10.0000 mg | ORAL_TABLET | Freq: Three times a day (TID) | ORAL | 0 refills | Status: DC | PRN

## 2019-03-24 MED ORDER — ONDANSETRON 8 MG PO TBDP: 8.0000 mg | ORAL_TABLET | Freq: Three times a day (TID) | ORAL | 0 refills | Status: AC | PRN

## 2019-03-24 MED ORDER — NITROFURANTOIN MONOHYD MACRO 100 MG PO CAPS: 100.0000 mg | ORAL_CAPSULE | Freq: Every day | ORAL | 5 refills | Status: AC

## 2019-03-24 NOTE — Progress Notes (Signed)
Pt would like to know if she can get a refill on Flexeril and nausea meds, Zofran.   Pt would like have urine check to make sure UTI is clear, advised she may come to office and leave sample.  Pt states she is having a little vaginal irritation as well, advised she may come in for nurse visit.   Pt has not gotten BP cuff yet.

## 2019-03-24 NOTE — Progress Notes (Signed)
I connected with Amanda Davies on 03/24/19 at  2:00 PM EST by: phone and verified that I am speaking with the correct person using two identifiers.  Patient is located at home and provider is located at [facility].     The purpose of this virtual visit is to provide medical care while limiting exposure to the novel coronavirus. I discussed the limitations, risks, security and privacy concerns of performing an evaluation and management service by phone and the availability of in person appointments. I also discussed with the patient that there may be a patient responsible charge related to this service. By engaging in this virtual visit, you consent to the provision of healthcare.  Additionally, you authorize for your insurance to be billed for the services provided during this visit.  The patient expressed understanding and agreed to proceed.  The following staff members participated in the virtual visit:  Vernice Jefferson, NP    PRENATAL VISIT NOTE  Subjective:  Amanda Davies is a 18 y.o. [id number] at [redacted]w[redacted]d  for phone visit for ongoing prenatal care.  She is currently monitored for the following issues for this high-risk pregnancy and has H/O preterm delivery, currently pregnant; Supervision of high risk pregnancy, antepartum; Pyelonephritis affecting pregnancy; and Alpha thalassemia silent carrier on their problem list.  Patient reports no complaints.  Contractions: Not present. Vag. Bleeding: None.   . Denies leaking of fluid.   The following portions of the patient's history were reviewed and updated as appropriate: allergies, current medications, past family history, past medical history, past social history, past surgical history and problem list.   Objective:  There were no vitals filed for this visit. Self-Obtained Pt reports she is unable to take her BP at home today as she does not yet have her cuff, but will pick it up tomorrow.  Fetal Status:           Assessment and Plan:  Pregnancy:  [id number] at [redacted]w[redacted]d  1. Supervision of high risk pregnancy, antepartum -pt pregnancy box states "dating unsure," discussed with patient, pt vonfirms she is unsure of her LMP, but she did have an early [place] at the [facility] - patient to sign records release at next office visit for early [place] documentation -anatomy scan order entered -pregnancy box filled -future order entered for [facility] -pt requesting refill for Zofran as she has struggled with nausea since 7wks of pregnancy and takes Zofran PRN as this is the only thing that works for her, order placed -pt also requesting RX for Flexeril for chronic back pain, RX sent  2. H/O preterm delivery, currently pregnant -discussed Makena, pt advised to start at [place] paperwork completed, medication not in clinic, RN to call and inquire  3. Pyelonephritis affecting pregnancy in first trimester -d/c from hospital 03/05/2019 on Bactrim BID X10days, pt reports she took medication as prescribed and finished the medication on Monday -consulted with Dr. Rosana Hoes re: prophylaxis, per Dr. Rosana Hoes pt should be on Macrobid 100mg  QHS daily for prevention (of note, pt was on hold while I consulted with Dr. Rosana Hoes and patient hung up prior to discussion of Macrobid, RN to notify about Macrobid and Makena, per above) -Future order entered for urine culture  4. Alpha thalassemia silent carrier -had genetic counseling 03/15/2019 -pt reports partner did not get tested, and pt reports she is unsure if he is willing to get tested, but she will ask him and let us know  Preterm labor symptoms and general obstetric precautions including  but not limited to vaginal bleeding, contractions, leaking of fluid and fetal movement were reviewed in detail with the patient.  Return in about 2 days (around 03/26/2019) for in-person lab ASAP, Makena 1wk, ROB 4wks, antaomy 4wks, needs [place] ROI.  Future Appointments  Date Time Provider Department Center  04/07/2019  2:00 PM Conan Bowens, MD [facility] None   Time spent on virtual visit: 22 minutes.  Marylen Ponto, NP

## 2019-03-24 NOTE — Patient Instructions (Addendum)
The Maternity Assessment Unit (MAU) is located at the [facility] and [place] [facility]. The address is: [address] [place], [place], [place] [address]. Please see map below for additional directions.    The Maternity Assessment Unit is designed to help you during your pregnancy, and for up to 6 weeks after delivery, with any pregnancy- or postpartum-related emergencies, if you think you are in labor, or if your water has broken. For example, if you experience nausea and vomiting, vaginal bleeding, severe abdominal or pelvic pain, elevated blood pressure or other problems related to your pregnancy or postpartum time, please come to the Maternity Assessment Unit for assistance.   Hydroxyprogesterone caproate injection for pregnancy What is this medicine? HYDROXYPROGESTERONE (hye drox ee proe JES ter one) is a female hormone. This medicine is used in women who are pregnant and who have delivered a baby too early (preterm) in the past. It helps lower the risk of having a preterm baby again. This medicine may be used for other purposes; ask your health care provider or pharmacist if you have questions. COMMON BRAND NAME(S): Makena What should I tell my health care provider before I take this medicine? They need to know if you have any of these conditions:  breast, cervical, uterine, or vaginal cancer  depression  diabetes or prediabetes  heart disease  high blood pressure  history of blood clots  kidney disease  liver disease  lung or breathing disease, like asthma  migraine headaches  seizures  vaginal bleeding  an unusual or allergic reaction to hydroxyprogesterone, other hormones, castor oil, benzyl alcohol, other medicines, foods, dyes, or preservatives  breast-feeding How should I use this medicine? This medicine is for injection into a muscle or under the skin. You will receive an injection once every week (every 7 days) as directed during  your pregnancy. It is given by a health care professional in a hospital or clinic setting. Talk to your pediatrician regarding the use of this medicine in children. While this drug may be prescribed for pregnant women as young as 16 years, precautions do apply. Overdosage: If you think you have taken too much of this medicine contact a poison control center or emergency room at once. NOTE: This medicine is only for you. Do not share this medicine with others. What if I miss a dose? It is important not to miss your dose. Call your doctor or health care professional if you are unable to keep an appointment. What may interact with this medicine? Significant interactions are not expected. This list may not describe all possible interactions. Give your health care provider a list of all the medicines, herbs, non-prescription drugs, or dietary supplements you use. Also tell them if you smoke, drink alcohol, or use illegal drugs. Some items may interact with your medicine. What should I watch for while using this medicine? Your pregnancy will be monitored carefully while you are receiving this medicine. What side effects may I notice from receiving this medicine? Side effects that you should report to your doctor or health care professional as soon as possible:  allergic reactions like skin rash, itching or hives, swelling of the face, lips, or tongue  breathing problems  depressed mood  increase in blood pressure  increased hunger or thirst  increased urination  signs and symptoms of a blood clot such as breathing problems; changes in vision; chest pain; severe, sudden headache; pain, swelling, warmth in the leg; trouble speaking; sudden numbness or weakness  of the face, arm or leg  unusually weak or tired  unusual vaginal bleeding  yellowing of the eyes or skin Side effects that usually do not require medical attention (report to your doctor or health care professional if they continue or  are bothersome):  diarrhea  fluid retention and swelling  nausea  pain, redness, or irritation at site where injected This list may not describe all possible side effects. Call your doctor for medical advice about side effects. You may report side effects to FDA at 1-800-FDA-1088. Where should I keep my medicine? This drug is given in a hospital or clinic and will not be stored at home. NOTE: This sheet is a summary. It may not cover all possible information. If you have questions about this medicine, talk to your doctor, pharmacist, or health care provider.  2020 Elsevier/Gold Standard (2017-01-04 11:14:47)  Second Trimester of Pregnancy The second trimester is from week 14 through week 27 (months 4 through 6). The second trimester is often a time when you feel your best. Your body has adjusted to being pregnant, and you begin to feel better physically. Usually, morning sickness has lessened or quit completely, you may have more energy, and you may have an increase in appetite. The second trimester is also a time when the fetus is growing rapidly. At the end of the sixth month, the fetus is about 9 inches long and weighs about 1 pounds. You will likely begin to feel the baby move (quickening) between 16 and 20 weeks of pregnancy. Body changes during your second trimester Your body continues to go through many changes during your second trimester. The changes vary from woman to woman.  Your weight will continue to increase. You will notice your lower abdomen bulging out.  You may begin to get stretch marks on your hips, abdomen, and breasts.  You may develop headaches that can be relieved by medicines. The medicines should be approved by your health care provider.  You may urinate more often because the fetus is pressing on your bladder.  You may develop or continue to have heartburn as a result of your pregnancy.  You may develop constipation because certain hormones are causing the  muscles that push waste through your intestines to slow down.  You may develop hemorrhoids or swollen, bulging veins (varicose veins).  You may have back pain. This is caused by: ? Weight gain. ? Pregnancy hormones that are relaxing the joints in your pelvis. ? A shift in weight and the muscles that support your balance.  Your breasts will continue to grow and they will continue to become tender.  Your gums may bleed and may be sensitive to brushing and flossing.  Dark spots or blotches (chloasma, mask of pregnancy) may develop on your face. This will likely fade after the baby is born.  A dark line from your belly button to the pubic area (linea nigra) may appear. This will likely fade after the baby is born.  You may have changes in your hair. These can include thickening of your hair, rapid growth, and changes in texture. Some women also have hair loss during or after pregnancy, or hair that feels dry or thin. Your hair will most likely return to normal after your baby is born. What to expect at prenatal visits During a routine prenatal visit:  You will be weighed to make sure you and the fetus are growing normally.  Your blood pressure will be taken.  Your abdomen will be  measured to track your baby's growth.  The fetal heartbeat will be listened to.  Any test results from the previous visit will be discussed. Your health care provider may ask you:  How you are feeling.  If you are feeling the baby move.  If you have had any abnormal symptoms, such as leaking fluid, bleeding, severe headaches, or abdominal cramping.  If you are using any tobacco products, including cigarettes, chewing tobacco, and electronic cigarettes.  If you have any questions. Other tests that may be performed during your second trimester include:  Blood tests that check for: ? Low iron levels (anemia). ? High blood sugar that affects pregnant women (gestational diabetes) between 7624 and 28 weeks.  ? Rh antibodies. This is to check for a protein on red blood cells (Rh factor).  Urine tests to check for infections, diabetes, or protein in the urine.  An ultrasound to confirm the proper growth and development of the baby.  An amniocentesis to check for possible genetic problems.  Fetal screens for spina bifida and Down syndrome.  HIV (human immunodeficiency virus) testing. Routine prenatal testing includes screening for HIV, unless you choose not to have this test. Follow these instructions at home: Medicines  Follow your health care provider's instructions regarding medicine use. Specific medicines may be either safe or unsafe to take during pregnancy.  Take a prenatal vitamin that contains at least 600 micrograms (mcg) of folic acid.  If you develop constipation, try taking a stool softener if your health care provider approves. Eating and drinking   Eat a balanced diet that includes fresh fruits and vegetables, whole grains, good sources of protein such as meat, eggs, or tofu, and low-fat dairy. Your health care provider will help you determine the amount of weight gain that is right for you.  Avoid raw meat and uncooked cheese. These carry germs that can cause birth defects in the baby.  If you have low calcium intake from food, talk to your health care provider about whether you should take a daily calcium supplement.  Limit foods that are high in fat and processed sugars, such as fried and sweet foods.  To prevent constipation: ? Drink enough fluid to keep your urine clear or pale yellow. ? Eat foods that are high in fiber, such as fresh fruits and vegetables, whole grains, and beans. Activity  Exercise only as directed by your health care provider. Most women can continue their usual exercise routine during pregnancy. Try to exercise for 30 minutes at least 5 days a week. Stop exercising if you experience uterine contractions.  Avoid heavy lifting, wear low heel  shoes, and practice good posture.  A sexual relationship may be continued unless your health care provider directs you otherwise. Relieving pain and discomfort  Wear a good support bra to prevent discomfort from breast tenderness.  Take warm sitz baths to soothe any pain or discomfort caused by hemorrhoids. Use hemorrhoid cream if your health care provider approves.  Rest with your legs elevated if you have leg cramps or low back pain.  If you develop varicose veins, wear support hose. Elevate your feet for 15 minutes, 3-4 times a day. Limit salt in your diet. Prenatal Care  Write down your questions. Take them to your prenatal visits.  Keep all your prenatal visits as told by your health care provider. This is important. Safety  Wear your seat belt at all times when driving.  Make a list of emergency phone numbers, including numbers for  family, friends, the hospital, and police and fire departments. General instructions  Ask your health care provider for a referral to a local prenatal education class. Begin classes no later than the beginning of month 6 of your pregnancy.  Ask for help if you have counseling or nutritional needs during pregnancy. Your health care provider can offer advice or refer you to specialists for help with various needs.  Do not use hot tubs, steam rooms, or saunas.  Do not douche or use tampons or scented sanitary pads.  Do not cross your legs for long periods of time.  Avoid cat litter boxes and soil used by cats. These carry germs that can cause birth defects in the baby and possibly loss of the fetus by miscarriage or stillbirth.  Avoid all smoking, herbs, alcohol, and unprescribed drugs. Chemicals in these products can affect the formation and growth of the baby.  Do not use any products that contain nicotine or tobacco, such as cigarettes and e-cigarettes. If you need help quitting, ask your health care provider.  Visit your dentist if you have  not gone yet during your pregnancy. Use a soft toothbrush to brush your teeth and be gentle when you floss. Contact a health care provider if:  You have dizziness.  You have mild pelvic cramps, pelvic pressure, or nagging pain in the abdominal area.  You have persistent nausea, vomiting, or diarrhea.  You have a bad smelling vaginal discharge.  You have pain when you urinate. Get help right away if:  You have a fever.  You are leaking fluid from your vagina.  You have spotting or bleeding from your vagina.  You have severe abdominal cramping or pain.  You have rapid weight gain or weight loss.  You have shortness of breath with chest pain.  You notice sudden or extreme swelling of your face, hands, ankles, feet, or legs.  You have not felt your baby move in over an hour.  You have severe headaches that do not go away when you take medicine.  You have vision changes. Summary  The second trimester is from week 14 through week 27 (months 4 through 6). It is also a time when the fetus is growing rapidly.  Your body goes through many changes during pregnancy. The changes vary from woman to woman.  Avoid all smoking, herbs, alcohol, and unprescribed drugs. These chemicals affect the formation and growth your baby.  Do not use any tobacco products, such as cigarettes, chewing tobacco, and e-cigarettes. If you need help quitting, ask your health care provider.  Contact your health care provider if you have any questions. Keep all prenatal visits as told by your health care provider. This is important. This information is not intended to replace advice given to you by your health care provider. Make sure you discuss any questions you have with your health care provider. Document Released: 04/15/2001 Document Revised: 08/13/2018 Document Reviewed: 05/27/2016 Elsevier Patient Education  2020 [employer].  Contraception Choices Contraception, also called birth control, refers  to methods or devices that prevent pregnancy. Hormonal methods Contraceptive implant  A contraceptive implant is a thin, plastic tube that contains a hormone. It is inserted into the upper part of the arm. It can remain in place for up to 3 years. Progestin-only injections Progestin-only injections are injections of progestin, a synthetic form of the hormone progesterone. They are given every 3 months by a health care provider. Birth control pills  Birth control pills are pills that  contain hormones that prevent pregnancy. They must be taken once a day, preferably at the same time each day. Birth control patch  The birth control patch contains hormones that prevent pregnancy. It is placed on the skin and must be changed once a week for three weeks and removed on the fourth week. A prescription is needed to use this method of contraception. Vaginal ring  A vaginal ring contains hormones that prevent pregnancy. It is placed in the vagina for three weeks and removed on the fourth week. After that, the process is repeated with a new ring. A prescription is needed to use this method of contraception. Emergency contraceptive Emergency contraceptives prevent pregnancy after unprotected sex. They come in pill form and can be taken up to 5 days after sex. They work best the sooner they are taken after having sex. Most emergency contraceptives are available without a prescription. This method should not be used as your only form of birth control. Barrier methods Female condom  A female condom is a thin sheath that is worn over the penis during sex. Condoms keep sperm from going inside a woman's body. They can be used with a spermicide to increase their effectiveness. They should be disposed after a single use. Female condom  A female condom is a soft, loose-fitting sheath that is put into the vagina before sex. The condom keeps sperm from going inside a woman's body. They should be disposed after a single  use. Diaphragm  A diaphragm is a soft, dome-shaped barrier. It is inserted into the vagina before sex, along with a spermicide. The diaphragm blocks sperm from entering the uterus, and the spermicide kills sperm. A diaphragm should be left in the vagina for 6-8 hours after sex and removed within 24 hours. A diaphragm is prescribed and fitted by a health care provider. A diaphragm should be replaced every 1-2 years, after giving birth, after gaining more than 15 lb (6.8 kg), and after pelvic surgery. Cervical cap  A cervical cap is a round, soft latex or plastic cup that fits over the cervix. It is inserted into the vagina before sex, along with spermicide. It blocks sperm from entering the uterus. The cap should be left in place for 6-8 hours after sex and removed within 48 hours. A cervical cap must be prescribed and fitted by a health care provider. It should be replaced every 2 years. Sponge  A sponge is a soft, circular piece of polyurethane foam with spermicide on it. The sponge helps block sperm from entering the uterus, and the spermicide kills sperm. To use it, you make it wet and then insert it into the vagina. It should be inserted before sex, left in for at least 6 hours after sex, and removed and thrown away within 30 hours. Spermicides Spermicides are chemicals that kill or block sperm from entering the cervix and uterus. They can come as a cream, jelly, suppository, foam, or tablet. A spermicide should be inserted into the vagina with an applicator at least 10-15 minutes before sex to allow time for it to work. The process must be repeated every time you have sex. Spermicides do not require a prescription. Intrauterine contraception Intrauterine device (IUD) An IUD is a T-shaped device that is put in a woman's uterus. There are two types:  Hormone IUD.This type contains progestin, a synthetic form of the hormone progesterone. This type can stay in place for 3-5 years.  Copper  IUD.This type is wrapped in copper wire. It  can stay in place for 10 years.  Permanent methods of contraception Female tubal ligation In this method, a woman's fallopian tubes are sealed, tied, or blocked during surgery to prevent eggs from traveling to the uterus. Hysteroscopic sterilization In this method, a small, flexible insert is placed into each fallopian tube. The inserts cause scar tissue to form in the fallopian tubes and block them, so sperm cannot reach an egg. The procedure takes about 3 months to be effective. Another form of birth control must be used during those 3 months. Female sterilization This is a procedure to tie off the tubes that carry sperm (vasectomy). After the procedure, the man can still ejaculate fluid (semen). Natural planning methods Natural family planning In this method, a couple does not have sex on days when the woman could become pregnant. Calendar method This means keeping track of the length of each menstrual cycle, identifying the days when pregnancy can happen, and not having sex on those days. Ovulation method In this method, a couple avoids sex during ovulation. Symptothermal method This method involves not having sex during ovulation. The woman typically checks for ovulation by watching changes in her temperature and in the consistency of cervical mucus. Post-ovulation method In this method, a couple waits to have sex until after ovulation. Summary  Contraception, also called birth control, means methods or devices that prevent pregnancy.  Hormonal methods of contraception include implants, injections, pills, patches, vaginal rings, and emergency contraceptives.  Barrier methods of contraception can include female condoms, female condoms, diaphragms, cervical caps, sponges, and spermicides.  There are two types of IUDs (intrauterine devices). An IUD can be put in a woman's uterus to prevent pregnancy for 3-5 years.  Permanent sterilization can be  done through a procedure for males, females, or both.  Natural family planning methods involve not having sex on days when the woman could become pregnant. This information is not intended to replace advice given to you by your health care provider. Make sure you discuss any questions you have with your health care provider. Document Released: 04/21/2005 Document Revised: 04/23/2017 Document Reviewed: 05/24/2016 Elsevier Patient Education  2020 [employer].

## 2019-04-07 ENCOUNTER — Encounter: Payer: Medicaid Other | Admitting: Obstetrics and Gynecology

## 2019-04-14 ENCOUNTER — Telehealth: Payer: Self-pay

## 2019-04-14 NOTE — Telephone Encounter (Signed)
Pt called and and reports that she is having cramping since yesterday that is getting worse and mucousy discharge. I advised pt go to the hospital for evaluation because of history of preterm delivery x2. Pt denies bleeding. Pt verbalizes understanding and says she will go.

## 2019-04-19 ENCOUNTER — Inpatient Hospital Stay (HOSPITAL_COMMUNITY)
Admission: EM | Admit: 2019-04-19 | Discharge: 2019-04-20 | Disposition: A | Discharge status: Home / Self Care | Payer: Medicaid Other | Attending: Obstetrics & Gynecology | Admitting: Obstetrics & Gynecology

## 2019-04-19 ENCOUNTER — Inpatient Hospital Stay (HOSPITAL_COMMUNITY): Payer: Medicaid Other

## 2019-04-19 ENCOUNTER — Encounter: Payer: Medicaid Other | Admitting: Obstetrics & Gynecology

## 2019-04-19 ENCOUNTER — Encounter (HOSPITAL_COMMUNITY): Payer: Self-pay | Admitting: Obstetrics & Gynecology

## 2019-04-19 ENCOUNTER — Other Ambulatory Visit: Payer: Self-pay

## 2019-04-19 DIAGNOSIS — R1030 Lower abdominal pain, unspecified: Secondary | ICD-10-CM | POA: Diagnosis not present

## 2019-04-19 DIAGNOSIS — O99891 Other specified diseases and conditions complicating pregnancy: Secondary | ICD-10-CM | POA: Diagnosis not present

## 2019-04-19 DIAGNOSIS — R109 Unspecified abdominal pain: Secondary | ICD-10-CM

## 2019-04-19 DIAGNOSIS — Z3A18 18 weeks gestation of pregnancy: Secondary | ICD-10-CM

## 2019-04-19 DIAGNOSIS — O9A212 Injury, poisoning and certain other consequences of external causes complicating pregnancy, second trimester: Secondary | ICD-10-CM | POA: Diagnosis not present

## 2019-04-19 DIAGNOSIS — R519 Headache, unspecified: Secondary | ICD-10-CM | POA: Diagnosis not present

## 2019-04-19 DIAGNOSIS — Z825 Family history of asthma and other chronic lower respiratory diseases: Secondary | ICD-10-CM | POA: Diagnosis not present

## 2019-04-19 DIAGNOSIS — O09212 Supervision of pregnancy with history of pre-term labor, second trimester: Secondary | ICD-10-CM | POA: Insufficient documentation

## 2019-04-19 DIAGNOSIS — M542 Cervicalgia: Secondary | ICD-10-CM | POA: Insufficient documentation

## 2019-04-19 DIAGNOSIS — S199XXA Unspecified injury of neck, initial encounter: Secondary | ICD-10-CM | POA: Diagnosis not present

## 2019-04-19 DIAGNOSIS — M545 Low back pain: Secondary | ICD-10-CM | POA: Insufficient documentation

## 2019-04-19 DIAGNOSIS — Y92410 Unspecified street and highway as the place of occurrence of the external cause: Secondary | ICD-10-CM | POA: Insufficient documentation

## 2019-04-19 DIAGNOSIS — Z79899 Other long term (current) drug therapy: Secondary | ICD-10-CM | POA: Diagnosis not present

## 2019-04-19 DIAGNOSIS — R52 Pain, unspecified: Secondary | ICD-10-CM | POA: Diagnosis not present

## 2019-04-19 DIAGNOSIS — S0990XA Unspecified injury of head, initial encounter: Secondary | ICD-10-CM | POA: Diagnosis not present

## 2019-04-19 DIAGNOSIS — O26892 Other specified pregnancy related conditions, second trimester: Secondary | ICD-10-CM

## 2019-04-19 LAB — WET PREP, GENITAL
Clue Cells Wet Prep HPF POC: NONE SEEN
Sperm: NONE SEEN
Trich, Wet Prep: NONE SEEN
Yeast Wet Prep HPF POC: NONE SEEN

## 2019-04-19 LAB — URINALYSIS, ROUTINE W REFLEX MICROSCOPIC
Bacteria, UA: NONE SEEN
Bilirubin Urine: NEGATIVE
Glucose, UA: NEGATIVE mg/dL
Hgb urine dipstick: NEGATIVE
Ketones, ur: NEGATIVE mg/dL
Nitrite: NEGATIVE
Protein, ur: NEGATIVE mg/dL
Specific Gravity, Urine: 1.024 (ref 1.005–1.030)
pH: 6 (ref 5.0–8.0)

## 2019-04-19 MED ORDER — CYCLOBENZAPRINE HCL 10 MG PO TABS
10.0000 mg | ORAL_TABLET | Freq: Once | ORAL | Status: AC
  Administered 2019-04-19: 10 mg via ORAL
  Filled 2019-04-19: qty 1

## 2019-04-19 MED ORDER — ONDANSETRON HCL 4 MG PO TABS
4.0000 mg | ORAL_TABLET | Freq: Once | ORAL | Status: AC
  Administered 2019-04-19: 4 mg via ORAL
  Filled 2019-04-19: qty 1

## 2019-04-19 MED ORDER — TRAMADOL HCL 50 MG PO TABS
50.0000 mg | ORAL_TABLET | Freq: Once | ORAL | Status: AC
  Administered 2019-04-19: 50 mg via ORAL
  Filled 2019-04-19: qty 1
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
## 2019-04-20 ENCOUNTER — Other Ambulatory Visit: Payer: Self-pay | Admitting: *Deleted

## 2019-04-20 DIAGNOSIS — O0990 Supervision of high risk pregnancy, unspecified, unspecified trimester: Secondary | ICD-10-CM

## 2019-04-20 LAB — GC/CHLAMYDIA PROBE AMP (~~LOC~~) NOT AT ARMC
Chlamydia: NEGATIVE
Comment: NEGATIVE
Comment: NORMAL
Neisseria Gonorrhea: NEGATIVE

## 2019-04-20 MED ORDER — IBUPROFEN 800 MG PO TABS: 800.0000 mg | ORAL_TABLET | Freq: Three times a day (TID) | ORAL | 0 refills | Status: DC | PRN

## 2019-04-20 NOTE — Progress Notes (Signed)
Change in u/s order per A. Olena Heckle due to ins coverage.

## 2019-04-20 NOTE — Discharge Instructions (Signed)
Abdominal Pain During Pregnancy ° °Abdominal pain is common during pregnancy, and has many possible causes. Some causes are more serious than others, and sometimes the cause is not known. Abdominal pain can be a sign that labor is starting. It can also be caused by normal growth and stretching of muscles and ligaments during pregnancy. Always tell your health care provider if you have any abdominal pain. °Follow these instructions at home: °· Do not have sex or put anything in your vagina until your pain goes away completely. °· Get plenty of rest until your pain improves. °· Drink enough fluid to keep your urine pale yellow. °· Take over-the-counter and prescription medicines only as told by your health care provider. °· Keep all follow-up visits as told by your health care provider. This is important. °Contact a health care provider if: °· Your pain continues or gets worse after resting. °· You have lower abdominal pain that: °? Comes and goes at regular intervals. °? Spreads to your back. °? Is similar to menstrual cramps. °· You have pain or burning when you urinate. °Get help right away if: °· You have a fever or chills. °· You have vaginal bleeding. °· You are leaking fluid from your vagina. °· You are passing tissue from your vagina. °· You have vomiting or diarrhea that lasts for more than 24 hours. °· Your baby is moving less than usual. °· You feel very weak or faint. °· You have shortness of breath. °· You develop severe pain in your upper abdomen. °Summary °· Abdominal pain is common during pregnancy, and has many possible causes. °· If you experience abdominal pain during pregnancy, tell your health care provider right away. °· Follow your health care provider's home care instructions and keep all follow-up visits as directed. °This information is not intended to replace advice given to you by your health care provider. Make sure you discuss any questions you have with your health care  provider. °Document Released: 04/21/2005 Document Revised: 08/09/2018 Document Reviewed: 07/24/2016 °Elsevier Patient Education © 2020 Else[employer] Inc. ° °

## 2019-04-21 ENCOUNTER — Ambulatory Visit (HOSPITAL_COMMUNITY): Payer: Medicaid Other

## 2019-04-21 LAB — CULTURE, OB URINE: Culture: 70000 — AB

## 2019-04-22 ENCOUNTER — Other Ambulatory Visit: Payer: Self-pay | Admitting: Advanced Practice Midwife

## 2019-04-22 MED ORDER — AMOXICILLIN 500 MG PO CAPS: 500.0000 mg | ORAL_CAPSULE | Freq: Three times a day (TID) | ORAL | 0 refills | Status: DC

## 2019-04-22 NOTE — Progress Notes (Signed)
+  GBS on urine culture. Attempted to call the patient. No answer. RX sent to pharmacy on file.   Marcille Buffy DNP, CNM  04/22/19  9:36 AM

## 2019-04-25 ENCOUNTER — Encounter: Payer: Self-pay | Admitting: Student

## 2019-04-25 DIAGNOSIS — R8271 Bacteriuria: Secondary | ICD-10-CM

## 2019-04-25 HISTORY — DX: Bacteriuria: R82.71

## 2019-05-03 ENCOUNTER — Other Ambulatory Visit: Payer: Self-pay

## 2019-05-03 MED ORDER — VITAFOL GUMMIES 3.33-0.333-34.8 MG PO CHEW: 1.0000 | CHEWABLE_TABLET | Freq: Every day | ORAL | 4 refills | Status: DC

## 2019-05-03 MED ORDER — ONDANSETRON HCL 4 MG PO TABS: 4.0000 mg | ORAL_TABLET | Freq: Three times a day (TID) | ORAL | 1 refills | Status: DC | PRN

## 2019-05-06 NOTE — L&D Delivery Note (Addendum)
OB/GYN Faculty Practice Delivery Note  Amanda Davies is a 19 y.o. [id number] s/p VD at [redacted]w[redacted]d. She was admitted for SOL.   ROM: 0h 102m with clearfluid GBS Status: positive bacteruria, s/p 2 doses PCN prior to delivery Maximum Maternal Temperature: 98.71F   Labor Progress: . Patient presented to L&D for SOL. Initial SVE: 7/90/-1. She had AROM and then progressed to complete.   Delivery Date/Time: 09/10/19 1156 Delivery: Called to room and patient was complete and pushing. Head delivered ROA. No nuchal cord present. Shoulder and body delivered in usual fashion. Infant placed on mother's abdomen, dried and stimulated. Due to lack of spontaneous cry and poor color, cord clamped x 2 after 30-second delay, and cut by Dr. Salomon Mast. Infant taken immediately to warmer by nursing staff and resuscitated with suction and blow-by oxygen.  Arterial cord gas drawn. Cord blood drawn. Placenta delivered spontaneously with gentle cord traction. Fundus firm with massage and Pitocin. Labia, perineum, vagina, and cervix inspected inspected with right labial and small first degree perineal laceration that were hemostatic- not requiring repair.   Due to brisk bleeding after delivery, despite manual uterine massage, 1000 mg PR cytotec and 1000 mg TXA were given in conjunction with pitocin.  Baby Weight: 2679 g  Placenta: Sent to L&D, 3 vessel cord, intact with trailing membranes Complications: FHR into 80s for last 3 contractions; addition resuscitation needed for this FGR infant after delivery Lacerations: right labial and small first degree perineal- hemostatic, not repaired EBL: 400 mL  Analgesia: Epidural   Arterial Cord Gas:  pH 7.159, CO2 75.8, Bicarb 25.9  Infant: female APGAR (1 MIN):  7 APGAR (5 MINS):  9    Amanda Cabal, DO PGY-1, [facility] 09/10/2019 12:15 PM     GME ATTESTATION:  I saw and evaluated the patient. I was gloved and supervised the resident throughout the entire  delivery. I personally clamped and cut the cord so the infant could be brought to the warmer for additional resuscitation. I agree with the findings and the plan of care as documented in the resident's note.  Amanda Alt, DO OB Fellow, Faculty [facility], Center for [facility] 09/10/2019 2:52 PM

## 2019-05-16 ENCOUNTER — Encounter: Payer: Medicaid Other | Admitting: Obstetrics and Gynecology

## 2019-05-19 ENCOUNTER — Encounter: Payer: Medicaid Other | Admitting: Family Medicine

## 2019-06-01 ENCOUNTER — Encounter: Payer: Medicaid Other | Admitting: Obstetrics and Gynecology

## 2019-06-02 ENCOUNTER — Telehealth: Payer: Self-pay | Admitting: Family Medicine

## 2019-06-03 ENCOUNTER — Other Ambulatory Visit: Payer: Self-pay

## 2019-06-03 ENCOUNTER — Ambulatory Visit (INDEPENDENT_AMBULATORY_CARE_PROVIDER_SITE_OTHER): Payer: Medicaid Other | Admitting: Medical

## 2019-06-03 ENCOUNTER — Encounter: Payer: Self-pay | Admitting: Medical

## 2019-06-03 VITALS — BP 98/64 | HR 90 | Wt 125.6 lb

## 2019-06-03 DIAGNOSIS — O09899 Supervision of other high risk pregnancies, unspecified trimester: Secondary | ICD-10-CM

## 2019-06-03 DIAGNOSIS — O09892 Supervision of other high risk pregnancies, second trimester: Secondary | ICD-10-CM

## 2019-06-03 DIAGNOSIS — O219 Vomiting of pregnancy, unspecified: Secondary | ICD-10-CM

## 2019-06-03 DIAGNOSIS — O09212 Supervision of pregnancy with history of pre-term labor, second trimester: Secondary | ICD-10-CM

## 2019-06-03 DIAGNOSIS — O0990 Supervision of high risk pregnancy, unspecified, unspecified trimester: Secondary | ICD-10-CM

## 2019-06-03 DIAGNOSIS — R8271 Bacteriuria: Secondary | ICD-10-CM

## 2019-06-03 DIAGNOSIS — Z3A25 25 weeks gestation of pregnancy: Secondary | ICD-10-CM

## 2019-06-03 MED ORDER — AMOXICILLIN 500 MG PO CAPS: 500.0000 mg | ORAL_CAPSULE | Freq: Three times a day (TID) | ORAL | 0 refills | Status: DC

## 2019-06-03 MED ORDER — HYDROXYPROGESTERONE CAPROATE 275 MG/1.1ML ~~LOC~~ SOAJ
275.0000 mg | Freq: Once | SUBCUTANEOUS | Status: AC
  Administered 2019-06-03: 275 mg via SUBCUTANEOUS

## 2019-06-03 MED ORDER — CYCLOBENZAPRINE HCL 10 MG PO TABS: 10.0000 mg | ORAL_TABLET | Freq: Three times a day (TID) | ORAL | 0 refills | Status: DC | PRN

## 2019-06-03 MED ORDER — VITAFOL GUMMIES 3.33-0.333-34.8 MG PO CHEW: 1.0000 | CHEWABLE_TABLET | Freq: Every day | ORAL | 4 refills | Status: DC

## 2019-06-03 MED ORDER — ONDANSETRON HCL 4 MG PO TABS: 4.0000 mg | ORAL_TABLET | Freq: Three times a day (TID) | ORAL | 1 refills | Status: DC | PRN

## 2019-06-03 NOTE — Progress Notes (Signed)
ROB.  C/o NV, pressure. Needs refills on Zofran and Flexeril.  17P given in LA, tolerated well.  Administrations This Visit    HYDROXYprogesterone caproate (Makena) autoinjector 275 mg    Admin Date 06/03/2019 Action Given Dose 275 mg Route Subcutaneous Administered By Maretta Bees, RMA

## 2019-06-03 NOTE — Progress Notes (Signed)
   PRENATAL VISIT NOTE  Subjective:  Amanda Davies is a 19 y.o. [id number] at [redacted]w[redacted]d being seen today for ongoing prenatal care.  She is currently monitored for the following issues for this high-risk pregnancy and has H/O preterm delivery, currently pregnant; Supervision of high risk pregnancy, antepartum; Pyelonephritis affecting pregnancy; Alpha thalassemia silent carrier; and GBS bacteriuria on their problem list.  Patient reports occasional contractions.  Contractions: Irregular. Vag. Bleeding: None.  Movement: Present. Denies leaking of fluid.   The following portions of the patient's history were reviewed and updated as appropriate: allergies, current medications, past family history, past medical history, past social history, past surgical history and problem list.   Objective:   Vitals:   06/03/19 1040  BP: 98/64  Pulse: 90  Weight: 125 lb 9.6 oz (57 kg)    Fetal Status: Fetal Heart Rate (bpm): 141   Movement: Present     General:  Alert, oriented and cooperative. Patient is in no acute distress.  Skin: Skin is warm and dry. No rash noted.   Cardiovascular: Normal heart rate noted  Respiratory: Normal respiratory effort, no problems with respiration noted  Abdomen: Soft, gravid, appropriate for gestational age.  Pain/Pressure: Present     Pelvic: Cervical exam deferred        Extremities: Normal range of motion.  Edema: None  Mental Status: Normal mood and affect. Normal behavior. Normal judgment and thought content.   Assessment and Plan:  Pregnancy: [id number] at [redacted]w[redacted]d 1. Supervision of high risk pregnancy, antepartum - Prenatal Vit-Fe Phos-FA-Omega (VITAFOL GUMMIES) 3.33-0.333-34.8 MG CHEW; Chew 1 Dose by mouth daily.  Dispense: 90 tablet; Refill: 4 - Alpha Thal carrier. Had genetic counseling. Partner declined testing  - Declined flu shot today   2. GBS bacteriuria - Patient never picked up Rx from December MAU visit. Rx resent  - amoxicillin (AMOXIL) 500 MG capsule; Take  1 capsule (500 mg total) by mouth 3 (three) times daily.  Dispense: 15 capsule; Refill: 0  3. H/O preterm delivery, currently pregnant - 17-P given today by nurse prior to consulting with provider, too late to start course of 17-P at this time, will not continue   4. Nausea and vomiting during pregnancy - ondansetron (ZOFRAN) 4 MG tablet; Take 1-2 tablets (4-8 mg total) by mouth every 8 (eight) hours as needed for nausea or vomiting.  Dispense: 30 tablet; Refill: 1  5. Back pain in pregnancy, second trimester  - cyclobenzaprine (FLEXERIL) 10 MG tablet; Take 1 tablet (10 mg total) by mouth every 8 (eight) hours as needed for muscle spasms.  Dispense: 30 tablet; Refill: 0  Preterm labor symptoms and general obstetric precautions including but not limited to vaginal bleeding, contractions, leaking of fluid and fetal movement were reviewed in detail with the patient. Please refer to After Visit Summary for other counseling recommendations.   Return in about 4 weeks (around 07/01/2019) for HOB, In-Person, 28 week labs (fasting) - with Faculty Attending.  No future appointments.  Vonzella Nipple, PA-C

## 2019-06-03 NOTE — Patient Instructions (Signed)

## 2019-06-13 ENCOUNTER — Ambulatory Visit (HOSPITAL_COMMUNITY): Payer: Medicaid Other

## 2019-06-13 ENCOUNTER — Ambulatory Visit (HOSPITAL_COMMUNITY): Payer: Medicaid Other | Attending: Obstetrics and Gynecology

## 2019-06-30 ENCOUNTER — Other Ambulatory Visit: Payer: Medicaid Other

## 2019-06-30 ENCOUNTER — Encounter: Payer: Medicaid Other | Admitting: Obstetrics & Gynecology

## 2019-07-01 ENCOUNTER — Other Ambulatory Visit: Payer: Medicaid Other

## 2019-07-12 ENCOUNTER — Encounter: Payer: Medicaid Other | Admitting: Obstetrics & Gynecology

## 2019-07-13 ENCOUNTER — Telehealth: Payer: Self-pay | Admitting: Obstetrics & Gynecology

## 2019-07-15 ENCOUNTER — Encounter: Payer: Medicaid Other | Admitting: Obstetrics and Gynecology

## 2019-07-21 ENCOUNTER — Ambulatory Visit (INDEPENDENT_AMBULATORY_CARE_PROVIDER_SITE_OTHER): Payer: Medicaid Other | Admitting: Medical

## 2019-07-21 ENCOUNTER — Other Ambulatory Visit: Payer: Self-pay

## 2019-07-21 ENCOUNTER — Encounter: Payer: Self-pay | Admitting: Medical

## 2019-07-21 VITALS — BP 103/70 | HR 102 | Wt 127.0 lb

## 2019-07-21 DIAGNOSIS — O99013 Anemia complicating pregnancy, third trimester: Secondary | ICD-10-CM

## 2019-07-21 DIAGNOSIS — Z3A32 32 weeks gestation of pregnancy: Secondary | ICD-10-CM

## 2019-07-21 DIAGNOSIS — O0933 Supervision of pregnancy with insufficient antenatal care, third trimester: Secondary | ICD-10-CM | POA: Insufficient documentation

## 2019-07-21 DIAGNOSIS — D563 Thalassemia minor: Secondary | ICD-10-CM

## 2019-07-21 DIAGNOSIS — R8271 Bacteriuria: Secondary | ICD-10-CM

## 2019-07-21 DIAGNOSIS — O219 Vomiting of pregnancy, unspecified: Secondary | ICD-10-CM

## 2019-07-21 DIAGNOSIS — O09899 Supervision of other high risk pregnancies, unspecified trimester: Secondary | ICD-10-CM

## 2019-07-21 DIAGNOSIS — O0990 Supervision of high risk pregnancy, unspecified, unspecified trimester: Secondary | ICD-10-CM

## 2019-07-21 HISTORY — DX: Supervision of pregnancy with insufficient antenatal care, third trimester: O09.33

## 2019-07-21 MED ORDER — VITAFOL GUMMIES 3.33-0.333-34.8 MG PO CHEW: 1.0000 | CHEWABLE_TABLET | Freq: Every day | ORAL | 4 refills | Status: DC

## 2019-07-21 MED ORDER — ACCU-CHEK GUIDE W/DEVICE KIT: 1.0000 [IU] | PACK | Freq: Four times a day (QID) | 0 refills | Status: DC

## 2019-07-21 MED ORDER — ACCU-CHEK GUIDE VI STRP: ORAL_STRIP | 12 refills | Status: DC

## 2019-07-21 MED ORDER — ONDANSETRON HCL 4 MG PO TABS: 4.0000 mg | ORAL_TABLET | Freq: Three times a day (TID) | ORAL | 1 refills | Status: DC | PRN

## 2019-07-21 MED ORDER — ACCU-CHEK SOFTCLIX LANCETS MISC: 1.0000 | Freq: Four times a day (QID) | 12 refills | Status: DC

## 2019-07-21 MED ORDER — COMFORT FIT MATERNITY SUPP MED MISC: 1.0000 [IU] | Freq: Every day | 0 refills | Status: DC

## 2019-07-21 NOTE — Patient Instructions (Addendum)
Fetal Movement Counts Patient Name: ________________________________________________ Patient Due Date: ____________________ What is a fetal movement count?  A fetal movement count is the number of times that you feel your baby move during a certain amount of time. This may also be called a fetal kick count. A fetal movement count is recommended for every pregnant woman. You may be asked to start counting fetal movements as early as week 28 of your pregnancy. Pay attention to when your baby is most active. You may notice your baby's sleep and wake cycles. You may also notice things that make your baby move more. You should do a fetal movement count:  When your baby is normally most active.  At the same time each day. A good time to count movements is while you are resting, after having something to eat and drink. How do I count fetal movements? 1. Find a quiet, comfortable area. Sit, or lie down on your side. 2. Write down the date, the start time and stop time, and the number of movements that you felt between those two times. Take this information with you to your health care visits. 3. Write down your start time when you feel the first movement. 4. Count kicks, flutters, swishes, rolls, and jabs. You should feel at least 10 movements. 5. You may stop counting after you have felt 10 movements, or if you have been counting for 2 hours. Write down the stop time. 6. If you do not feel 10 movements in 2 hours, contact your health care provider for further instructions. Your health care provider may want to do additional tests to assess your baby's well-being. Contact a health care provider if:  You feel fewer than 10 movements in 2 hours.  Your baby is not moving like he or she usually does. Date: ____________ Start time: ____________ Stop time: ____________ Movements: ____________ Date: ____________ Start time: ____________ Stop time: ____________ Movements: ____________ Date: ____________  Start time: ____________ Stop time: ____________ Movements: ____________ Date: ____________ Start time: ____________ Stop time: ____________ Movements: ____________ Date: ____________ Start time: ____________ Stop time: ____________ Movements: ____________ Date: ____________ Start time: ____________ Stop time: ____________ Movements: ____________ Date: ____________ Start time: ____________ Stop time: ____________ Movements: ____________ Date: ____________ Start time: ____________ Stop time: ____________ Movements: ____________ Date: ____________ Start time: ____________ Stop time: ____________ Movements: ____________ This information is not intended to replace advice given to you by your health care provider. Make sure you discuss any questions you have with your health care provider. Document Revised: 12/09/2018 Document Reviewed: 12/09/2018 Elsevier Patient Education  2020 [employer]. Braxton Hicks Contractions Contractions of the uterus can occur throughout pregnancy, but they are not always a sign that you are in labor. You may have practice contractions called Braxton Hicks contractions. These false labor contractions are sometimes confused with true labor. What are Braxton Hicks contractions? Braxton Hicks contractions are tightening movements that occur in the muscles of the uterus before labor. Unlike true labor contractions, these contractions do not result in opening (dilation) and thinning of the cervix. Toward the end of pregnancy (32-34 weeks), Braxton Hicks contractions can happen more often and may become stronger. These contractions are sometimes difficult to tell apart from true labor because they can be very uncomfortable. You should not feel embarrassed if you go to the hospital with false labor. Sometimes, the only way to tell if you are in true labor is for your health care provider to look for changes in the cervix. The health care provider   will do a physical exam and may  monitor your contractions. If you are not in true labor, the exam should show that your cervix is not dilating and your water has not broken. If there are no other health problems associated with your pregnancy, it is completely safe for you to be sent home with false labor. You may continue to have Braxton Hicks contractions until you go into true labor. How to tell the difference between true labor and false labor True labor  Contractions last 30-70 seconds.  Contractions become very regular.  Discomfort is usually felt in the top of the uterus, and it spreads to the lower abdomen and low back.  Contractions do not go away with walking.  Contractions usually become more intense and increase in frequency.  The cervix dilates and gets thinner. False labor  Contractions are usually shorter and not as strong as true labor contractions.  Contractions are usually irregular.  Contractions are often felt in the front of the lower abdomen and in the groin.  Contractions may go away when you walk around or change positions while lying down.  Contractions get weaker and are shorter-lasting as time goes on.  The cervix usually does not dilate or become thin. Follow these instructions at home:   Take over-the-counter and prescription medicines only as told by your health care provider.  Keep up with your usual exercises and follow other instructions from your health care provider.  Eat and drink lightly if you think you are going into labor.  If Braxton Hicks contractions are making you uncomfortable: ? Change your position from lying down or resting to walking, or change from walking to resting. ? Sit and rest in a tub of warm water. ? Drink enough fluid to keep your urine pale yellow. Dehydration may cause these contractions. ? Do slow and deep breathing several times an hour.  Keep all follow-up prenatal visits as told by your health care provider. This is important. Contact a  health care provider if:  You have a fever.  You have continuous pain in your abdomen. Get help right away if:  Your contractions become stronger, more regular, and closer together.  You have fluid leaking or gushing from your vagina.  You pass blood-tinged mucus (bloody show).  You have bleeding from your vagina.  You have low back pain that you never had before.  You feel your baby's head pushing down and causing pelvic pressure.  Your baby is not moving inside you as much as it used to. Summary  Contractions that occur before labor are called Braxton Hicks contractions, false labor, or practice contractions.  Braxton Hicks contractions are usually shorter, weaker, farther apart, and less regular than true labor contractions. True labor contractions usually become progressively stronger and regular, and they become more frequent.  Manage discomfort from Braxton Hicks contractions by changing position, resting in a warm bath, drinking plenty of water, or practicing deep breathing. This information is not intended to replace advice given to you by your health care provider. Make sure you discuss any questions you have with your health care provider. Document Revised: 04/03/2017 Document Reviewed: 09/04/2016 Elsevier Patient Education  2020 Elsevier Inc.   [facility]   [address], [place], [place] [address] Phone: [phone]  Monday     8:30AM-5PM Tuesday 8:30AM-5PM Wednesday 8:30AM-5PM Thursday 8:30AM-5PM Friday  8:30AM-5PM Saturday Closed Sunday Closed   

## 2019-07-21 NOTE — Progress Notes (Signed)
PRENATAL VISIT NOTE  Subjective:  Amanda Davies is a 19 y.o. [phone] at 35w0dbeing seen today for ongoing prenatal care.  She is currently monitored for the following issues for this high-risk pregnancy and has H/O preterm delivery, currently pregnant; Supervision of high risk pregnancy, antepartum; Pyelonephritis affecting pregnancy; Alpha thalassemia silent carrier; GBS bacteriuria; and Insufficient prenatal care in third trimester on their problem list.  Patient reports occasional contractions.  Contractions: Irregular. Vag. Bleeding: None.  Movement: Present. Denies leaking of fluid.   The following portions of the patient's history were reviewed and updated as appropriate: allergies, current medications, past family history, past medical history, past social history, past surgical history and problem list.   Objective:   Vitals:   07/21/19 1552  BP: 103/70  Pulse: (!) 102  Weight: 127 lb (57.6 kg)    Fetal Status: Fetal Heart Rate (bpm): 138 Fundal Height: 30 cm Movement: Present     General:  Alert, oriented and cooperative. Patient is in no acute distress.  Skin: Skin is warm and dry. No rash noted.   Cardiovascular: Normal heart rate noted  Respiratory: Normal respiratory effort, no problems with respiration noted  Abdomen: Soft, gravid, appropriate for gestational age.  Pain/Pressure: Present     Pelvic: Cervical exam deferred        Extremities: Normal range of motion.  Edema: None  Mental Status: Normal mood and affect. Normal behavior. Normal judgment and thought content.   Assessment and Plan:  Pregnancy: [id number] 342w0d. Supervision of high risk pregnancy, antepartum - CBC - RPR - HIV Antibody (routine testing w rflx) - Elastic Bandages & Supports (COMFORT FIT MATERNITY SUPP MED) MISC; 1 Units by Does not apply route daily.  Dispense: 1 each; Refill: 0 - Prenatal Vit-Fe Phos-FA-Omega (VITAFOL GUMMIES) 3.33-0.333-34.8 MG CHEW; Chew 1 Dose by mouth daily.   Dispense: 90 tablet; Refill: 4 - Blood Glucose Monitoring Suppl (ACCU-CHEK GUIDE) w/Device KIT; 1 Units by Does not apply route in the morning, at noon, in the evening, and at bedtime.  Dispense: 1 kit; Refill: 0 - glucose blood (ACCU-CHEK GUIDE) test strip; Use as instructed  Dispense: 100 each; Refill: 12 - Accu-Chek Softclix Lancets lancets; 1 each by Other route 4 (four) times daily. Use as instructed  Dispense: 100 each; Refill: 12 - Patient declined GTT and agrees to check CBG QID x 2 weeks. Will review at next visit.  - Declined TDap today   2. H/O preterm delivery, currently pregnant - Contractions are intermittent. Advised given history that she should have a low threshold to present to MAU with symptoms.  - She had agreed to 17-P but missed multiple appointments, it is no longer considered beneficial at this time   3. Alpha thalassemia silent carrier - Saw genetic counselor - partner testing advised   4. GBS bacteriuria - treat in labor  5. Insufficient prenatal care in third trimester - has missed multiple visits  6. Nausea and vomiting during pregnancy - ondansetron (ZOFRAN) 4 MG tablet; Take 1-2 tablets (4-8 mg total) by mouth every 8 (eight) hours as needed for nausea or vomiting.  Dispense: 30 tablet; Refill: 1  Preterm labor symptoms and general obstetric precautions including but not limited to vaginal bleeding, contractions, leaking of fluid and fetal movement were reviewed in detail with the patient. Please refer to After Visit Summary for other counseling recommendations.   [facility] (around [facility]04/2019[facility]  Future Appointments  Date Time Provider  [place]  07/26/2019  2:45 PM [place] NURSE [place] [facility]  07/26/2019  2:45 PM [place] 2 [facility]  08/04/2019  2:30 PM Constant, Vickii Chafe, MD [facility] None    Kerry Hough, PA-C

## 2019-07-22 LAB — HIV ANTIBODY (ROUTINE TESTING W REFLEX): HIV Screen 4th Generation wRfx: NONREACTIVE

## 2019-07-22 LAB — RPR: RPR Ser Ql: NONREACTIVE

## 2019-07-22 LAB — CBC
Hematocrit: 32.5 % — ABNORMAL LOW (ref 34.0–46.6)
Hemoglobin: 10.6 g/dL — ABNORMAL LOW (ref 11.1–15.9)
MCH: 24.4 pg — ABNORMAL LOW (ref 26.6–33.0)
MCHC: 32.6 g/dL (ref 31.5–35.7)
MCV: 75 fL — ABNORMAL LOW (ref 79–97)
Platelets: 203 10*3/uL (ref 150–450)
RBC: 4.34 x10E6/uL (ref 3.77–5.28)
RDW: 13.1 % (ref 11.7–15.4)
WBC: 8.5 10*3/uL (ref 3.4–10.8)

## 2019-07-26 ENCOUNTER — Ambulatory Visit (HOSPITAL_COMMUNITY)
Admission: RE | Admit: 2019-07-26 | Discharge: 2019-07-26 | Disposition: A | Discharge status: Home / Self Care | Payer: Medicaid Other | Source: Ambulatory Visit | Attending: Women's Health | Admitting: Women's Health

## 2019-07-26 ENCOUNTER — Other Ambulatory Visit (HOSPITAL_COMMUNITY): Payer: Self-pay | Admitting: *Deleted

## 2019-07-26 ENCOUNTER — Other Ambulatory Visit: Payer: Self-pay

## 2019-07-26 ENCOUNTER — Encounter (HOSPITAL_COMMUNITY): Payer: Self-pay | Admitting: *Deleted

## 2019-07-26 ENCOUNTER — Ambulatory Visit (HOSPITAL_COMMUNITY): Payer: Medicaid Other | Admitting: *Deleted

## 2019-07-26 DIAGNOSIS — O0990 Supervision of high risk pregnancy, unspecified, unspecified trimester: Secondary | ICD-10-CM | POA: Diagnosis not present

## 2019-07-26 DIAGNOSIS — O09213 Supervision of pregnancy with history of pre-term labor, third trimester: Secondary | ICD-10-CM

## 2019-07-26 DIAGNOSIS — Z3687 Encounter for antenatal screening for uncertain dates: Secondary | ICD-10-CM | POA: Diagnosis not present

## 2019-07-26 DIAGNOSIS — O0933 Supervision of pregnancy with insufficient antenatal care, third trimester: Secondary | ICD-10-CM | POA: Diagnosis not present

## 2019-07-26 DIAGNOSIS — Z3A35 35 weeks gestation of pregnancy: Secondary | ICD-10-CM

## 2019-07-26 DIAGNOSIS — D563 Thalassemia minor: Secondary | ICD-10-CM | POA: Diagnosis not present

## 2019-07-26 DIAGNOSIS — R8271 Bacteriuria: Secondary | ICD-10-CM | POA: Diagnosis not present

## 2019-07-26 DIAGNOSIS — O09219 Supervision of pregnancy with history of pre-term labor, unspecified trimester: Secondary | ICD-10-CM

## 2019-07-26 DIAGNOSIS — Z363 Encounter for antenatal screening for malformations: Secondary | ICD-10-CM

## 2019-07-26 DIAGNOSIS — Z148 Genetic carrier of other disease: Secondary | ICD-10-CM | POA: Diagnosis not present

## 2019-08-04 ENCOUNTER — Encounter: Payer: Medicaid Other | Admitting: Obstetrics and Gynecology

## 2019-08-15 ENCOUNTER — Encounter: Payer: Medicaid Other | Admitting: Obstetrics and Gynecology

## 2019-08-16 ENCOUNTER — Telehealth: Payer: Self-pay | Admitting: Obstetrics and Gynecology

## 2019-08-16 ENCOUNTER — Ambulatory Visit (HOSPITAL_COMMUNITY): Payer: Medicaid Other | Admitting: *Deleted

## 2019-08-16 ENCOUNTER — Encounter (HOSPITAL_COMMUNITY): Payer: Self-pay | Admitting: *Deleted

## 2019-08-16 ENCOUNTER — Encounter: Payer: Self-pay | Admitting: Obstetrics & Gynecology

## 2019-08-16 ENCOUNTER — Other Ambulatory Visit: Payer: Self-pay

## 2019-08-16 ENCOUNTER — Ambulatory Visit (INDEPENDENT_AMBULATORY_CARE_PROVIDER_SITE_OTHER): Payer: Medicaid Other | Admitting: Obstetrics & Gynecology

## 2019-08-16 ENCOUNTER — Ambulatory Visit (HOSPITAL_COMMUNITY): Payer: Medicaid Other

## 2019-08-16 ENCOUNTER — Ambulatory Visit (HOSPITAL_COMMUNITY)
Admission: RE | Admit: 2019-08-16 | Discharge: 2019-08-16 | Disposition: A | Discharge status: Home / Self Care | Payer: Medicaid Other | Source: Ambulatory Visit | Attending: Obstetrics and Gynecology | Admitting: Obstetrics and Gynecology

## 2019-08-16 VITALS — BP 95/59 | HR 102 | Wt 130.0 lb

## 2019-08-16 DIAGNOSIS — R8271 Bacteriuria: Secondary | ICD-10-CM | POA: Insufficient documentation

## 2019-08-16 DIAGNOSIS — Z3687 Encounter for antenatal screening for uncertain dates: Secondary | ICD-10-CM

## 2019-08-16 DIAGNOSIS — O09219 Supervision of pregnancy with history of pre-term labor, unspecified trimester: Secondary | ICD-10-CM | POA: Diagnosis not present

## 2019-08-16 DIAGNOSIS — Z3A33 33 weeks gestation of pregnancy: Secondary | ICD-10-CM

## 2019-08-16 DIAGNOSIS — O09293 Supervision of pregnancy with other poor reproductive or obstetric history, third trimester: Secondary | ICD-10-CM

## 2019-08-16 DIAGNOSIS — Z3A35 35 weeks gestation of pregnancy: Secondary | ICD-10-CM

## 2019-08-16 DIAGNOSIS — O0933 Supervision of pregnancy with insufficient antenatal care, third trimester: Secondary | ICD-10-CM | POA: Diagnosis not present

## 2019-08-16 DIAGNOSIS — Z362 Encounter for other antenatal screening follow-up: Secondary | ICD-10-CM

## 2019-08-16 DIAGNOSIS — D563 Thalassemia minor: Secondary | ICD-10-CM | POA: Diagnosis not present

## 2019-08-16 DIAGNOSIS — O0990 Supervision of high risk pregnancy, unspecified, unspecified trimester: Secondary | ICD-10-CM

## 2019-08-16 DIAGNOSIS — O09213 Supervision of pregnancy with history of pre-term labor, third trimester: Secondary | ICD-10-CM | POA: Diagnosis not present

## 2019-08-16 DIAGNOSIS — O219 Vomiting of pregnancy, unspecified: Secondary | ICD-10-CM

## 2019-08-16 MED ORDER — ONDANSETRON HCL 4 MG PO TABS: 4.0000 mg | ORAL_TABLET | Freq: Three times a day (TID) | ORAL | 1 refills | Status: DC | PRN

## 2019-08-16 MED ORDER — VITAFOL GUMMIES 3.33-0.333-34.8 MG PO CHEW: 1.0000 | CHEWABLE_TABLET | Freq: Every day | ORAL | 4 refills | Status: DC

## 2019-08-16 NOTE — Progress Notes (Signed)
ROB  Pt reports not checking sugars.  +GBS in urine per notes treat in labor.

## 2019-08-16 NOTE — Addendum Note (Signed)
Addended by: Raynelle Dick on: 08/16/2019 05:05 PM   Modules accepted: Orders

## 2019-08-16 NOTE — Progress Notes (Signed)
   PRENATAL VISIT NOTE  Subjective:  Amanda Davies is a 19 y.o. [phone] at [redacted]w[redacted]d being seen today for ongoing prenatal care.  She is currently monitored for the following issues for this high-risk pregnancy and has H/O preterm delivery, currently pregnant; Supervision of high risk pregnancy, antepartum; Pyelonephritis affecting pregnancy; Alpha thalassemia silent carrier; GBS bacteriuria; and Insufficient prenatal care in third trimester on their problem list.  Patient reports no complaints.  Contractions: Irritability. Vag. Bleeding: None.  Movement: Present. Denies leaking of fluid.   The following portions of the patient's history were reviewed and updated as appropriate: allergies, current medications, past family history, past medical history, past social history, past surgical history and problem list.   Objective:   Vitals:   08/16/19 1631  BP: (!) 95/59  Pulse: (!) 102  Weight: 59 kg    Fetal Status: Fetal Heart Rate (bpm): 144   Movement: Present     General:  Alert, oriented and cooperative. Patient is in no acute distress.  Skin: Skin is warm and dry. No rash noted.   Cardiovascular: Normal heart rate noted  Respiratory: Normal respiratory effort, no problems with respiration noted  Abdomen: Soft, gravid, appropriate for gestational age.  Pain/Pressure: Absent     Pelvic: Cervical exam deferred        Extremities: Normal range of motion.  Edema: None  Mental Status: Normal mood and affect. Normal behavior. Normal judgment and thought content.   Assessment and Plan:  Pregnancy: [id number] at [redacted]w[redacted]d There are no diagnoses linked to this encounter. Preterm labor symptoms and general obstetric precautions including but not limited to vaginal bleeding, contractions, leaking of fluid and fetal movement were reviewed in detail with the patient. Please refer to After Visit Summary for other counseling recommendations.   No follow-ups on file.  Future Appointments  Date Time  Provider Department Center  08/23/2019  3:30 PM Constant, Gigi Gin, MD [facility] None  09/06/2019  2:45 PM [facility] [place] 2 [facility]    Malachy Chamber, MD

## 2019-08-17 ENCOUNTER — Other Ambulatory Visit (HOSPITAL_COMMUNITY): Payer: Self-pay | Admitting: *Deleted

## 2019-08-17 DIAGNOSIS — O0933 Supervision of pregnancy with insufficient antenatal care, third trimester: Secondary | ICD-10-CM

## 2019-08-23 ENCOUNTER — Encounter: Payer: Medicaid Other | Admitting: Obstetrics and Gynecology

## 2019-08-29 ENCOUNTER — Encounter: Payer: Medicaid Other | Admitting: Obstetrics and Gynecology

## 2019-08-31 ENCOUNTER — Encounter: Payer: Self-pay | Admitting: Obstetrics and Gynecology

## 2019-08-31 ENCOUNTER — Telehealth: Payer: Self-pay | Admitting: Obstetrics and Gynecology

## 2019-09-06 ENCOUNTER — Ambulatory Visit (HOSPITAL_COMMUNITY): Payer: Medicaid Other | Attending: Obstetrics and Gynecology

## 2019-09-06 ENCOUNTER — Ambulatory Visit: Payer: Medicaid Other

## 2019-09-10 ENCOUNTER — Other Ambulatory Visit: Payer: Self-pay

## 2019-09-10 ENCOUNTER — Inpatient Hospital Stay (HOSPITAL_COMMUNITY): Payer: Medicaid Other | Admitting: Anesthesiology

## 2019-09-10 ENCOUNTER — Inpatient Hospital Stay (HOSPITAL_COMMUNITY)
Admission: AD | Admit: 2019-09-10 | Discharge: 2019-09-12 | DRG: 807 | Disposition: A | Discharge status: Home / Self Care | Payer: Medicaid Other | Attending: Obstetrics and Gynecology | Admitting: Obstetrics and Gynecology

## 2019-09-10 ENCOUNTER — Encounter (HOSPITAL_COMMUNITY): Payer: Self-pay | Admitting: Obstetrics and Gynecology

## 2019-09-10 DIAGNOSIS — Z3A37 37 weeks gestation of pregnancy: Secondary | ICD-10-CM

## 2019-09-10 DIAGNOSIS — O26893 Other specified pregnancy related conditions, third trimester: Secondary | ICD-10-CM | POA: Diagnosis present

## 2019-09-10 DIAGNOSIS — O99824 Streptococcus B carrier state complicating childbirth: Secondary | ICD-10-CM | POA: Diagnosis present

## 2019-09-10 DIAGNOSIS — O09899 Supervision of other high risk pregnancies, unspecified trimester: Secondary | ICD-10-CM

## 2019-09-10 DIAGNOSIS — O0990 Supervision of high risk pregnancy, unspecified, unspecified trimester: Secondary | ICD-10-CM

## 2019-09-10 DIAGNOSIS — R8271 Bacteriuria: Secondary | ICD-10-CM | POA: Diagnosis present

## 2019-09-10 DIAGNOSIS — Z20822 Contact with and (suspected) exposure to covid-19: Secondary | ICD-10-CM | POA: Diagnosis present

## 2019-09-10 DIAGNOSIS — O4290 Premature rupture of membranes, unspecified as to length of time between rupture and onset of labor, unspecified weeks of gestation: Secondary | ICD-10-CM

## 2019-09-10 DIAGNOSIS — O2300 Infections of kidney in pregnancy, unspecified trimester: Secondary | ICD-10-CM | POA: Diagnosis present

## 2019-09-10 DIAGNOSIS — O0933 Supervision of pregnancy with insufficient antenatal care, third trimester: Secondary | ICD-10-CM

## 2019-09-10 DIAGNOSIS — D563 Thalassemia minor: Secondary | ICD-10-CM | POA: Diagnosis present

## 2019-09-10 DIAGNOSIS — O4292 Full-term premature rupture of membranes, unspecified as to length of time between rupture and onset of labor: Secondary | ICD-10-CM | POA: Diagnosis present

## 2019-09-10 HISTORY — DX: Premature rupture of membranes, unspecified as to length of time between rupture and onset of labor, unspecified weeks of gestation: O42.90

## 2019-09-10 LAB — CBC
HCT: 35.8 % — ABNORMAL LOW (ref 36.0–46.0)
Hemoglobin: 10.7 g/dL — ABNORMAL LOW (ref 12.0–15.0)
MCH: 23.1 pg — ABNORMAL LOW (ref 26.0–34.0)
MCHC: 29.9 g/dL — ABNORMAL LOW (ref 30.0–36.0)
MCV: 77.3 fL — ABNORMAL LOW (ref 80.0–100.0)
Platelets: 244 10*3/uL (ref 150–400)
RBC: 4.63 MIL/uL (ref 3.87–5.11)
RDW: 14.8 % (ref 11.5–15.5)
WBC: 8.5 10*3/uL (ref 4.0–10.5)
nRBC: 0 % (ref 0.0–0.2)

## 2019-09-10 LAB — TYPE AND SCREEN
ABO/RH(D): O POS
Antibody Screen: NEGATIVE

## 2019-09-10 LAB — GLUCOSE, CAPILLARY
Glucose-Capillary: 77 mg/dL (ref 70–99)
Glucose-Capillary: 53 mg/dL — ABNORMAL LOW (ref 70–99)
Glucose-Capillary: 65 mg/dL — ABNORMAL LOW (ref 70–99)

## 2019-09-10 LAB — RESPIRATORY PANEL BY RT PCR (FLU A&B, COVID)
Influenza A by PCR: NEGATIVE
Influenza B by PCR: NEGATIVE
SARS Coronavirus 2 by RT PCR: NEGATIVE

## 2019-09-10 LAB — RPR: RPR Ser Ql: NONREACTIVE

## 2019-09-10 MED ORDER — DIPHENHYDRAMINE HCL 25 MG PO CAPS: 25.0000 mg | ORAL_CAPSULE | Freq: Four times a day (QID) | ORAL | Status: DC | PRN

## 2019-09-10 MED ORDER — PRENATAL MULTIVITAMIN CH
1.0000 | ORAL_TABLET | Freq: Every day | ORAL | Status: DC
  Administered 2019-09-11 – 2019-09-12 (×2): 1 via ORAL
  Filled 2019-09-10 (×2): qty 1

## 2019-09-10 MED ORDER — EPHEDRINE 5 MG/ML INJ
10.0000 mg | INTRAVENOUS | Status: DC | PRN
  Administered 2019-09-10: 10:00:00 10 mg via INTRAVENOUS

## 2019-09-10 MED ORDER — IBUPROFEN 600 MG PO TABS
600.0000 mg | ORAL_TABLET | Freq: Four times a day (QID) | ORAL | Status: DC
  Administered 2019-09-10 – 2019-09-12 (×8): 600 mg via ORAL
  Filled 2019-09-10 (×8): qty 1

## 2019-09-10 MED ORDER — FENTANYL-BUPIVACAINE-NACL 0.5-0.125-0.9 MG/250ML-% EP SOLN
EPIDURAL | Status: AC
  Filled 2019-09-10: qty 250

## 2019-09-10 MED ORDER — COCONUT OIL OIL: 1.0000 "application " | TOPICAL_OIL | Status: DC | PRN

## 2019-09-10 MED ORDER — LACTATED RINGERS IV SOLN: 500.0000 mL | Freq: Once | INTRAVENOUS | Status: DC

## 2019-09-10 MED ORDER — TRANEXAMIC ACID 1000 MG/10ML IV SOLN
1.5000 mg/kg/h | INTRAVENOUS | Status: DC
  Filled 2019-09-10: qty 25

## 2019-09-10 MED ORDER — ONDANSETRON HCL 4 MG/2ML IJ SOLN: 4.0000 mg | INTRAMUSCULAR | Status: DC | PRN

## 2019-09-10 MED ORDER — PHENYLEPHRINE 40 MCG/ML (10ML) SYRINGE FOR IV PUSH (FOR BLOOD PRESSURE SUPPORT)
80.0000 ug | PREFILLED_SYRINGE | INTRAVENOUS | Status: AC | PRN
  Administered 2019-09-10 (×3): 80 ug via INTRAVENOUS

## 2019-09-10 MED ORDER — DIPHENHYDRAMINE HCL 50 MG/ML IJ SOLN: 12.5000 mg | INTRAMUSCULAR | Status: DC | PRN

## 2019-09-10 MED ORDER — FENTANYL-BUPIVACAINE-NACL 0.5-0.125-0.9 MG/250ML-% EP SOLN: 12.0000 mL/h | EPIDURAL | Status: DC | PRN

## 2019-09-10 MED ORDER — PHENYLEPHRINE 40 MCG/ML (10ML) SYRINGE FOR IV PUSH (FOR BLOOD PRESSURE SUPPORT): 80.0000 ug | PREFILLED_SYRINGE | INTRAVENOUS | Status: DC | PRN

## 2019-09-10 MED ORDER — OXYTOCIN 40 UNITS IN NORMAL SALINE INFUSION - SIMPLE MED: 2.5000 [IU]/h | INTRAVENOUS | Status: DC

## 2019-09-10 MED ORDER — SENNOSIDES-DOCUSATE SODIUM 8.6-50 MG PO TABS
2.0000 | ORAL_TABLET | ORAL | Status: DC
  Administered 2019-09-10 – 2019-09-11 (×2): 2 via ORAL
  Filled 2019-09-10 (×2): qty 2

## 2019-09-10 MED ORDER — ZOLPIDEM TARTRATE 5 MG PO TABS: 5.0000 mg | ORAL_TABLET | Freq: Every evening | ORAL | Status: DC | PRN

## 2019-09-10 MED ORDER — OXYTOCIN BOLUS FROM INFUSION
500.0000 mL | Freq: Once | INTRAVENOUS | Status: AC
  Administered 2019-09-10: 500 mL via INTRAVENOUS

## 2019-09-10 MED ORDER — DIBUCAINE (PERIANAL) 1 % EX OINT: 1.0000 "application " | TOPICAL_OINTMENT | CUTANEOUS | Status: DC | PRN

## 2019-09-10 MED ORDER — FENTANYL CITRATE (PF) 100 MCG/2ML IJ SOLN: 50.0000 ug | INTRAMUSCULAR | Status: DC | PRN

## 2019-09-10 MED ORDER — EPHEDRINE 5 MG/ML INJ
10.0000 mg | INTRAVENOUS | Status: DC | PRN
  Filled 2019-09-10: qty 10

## 2019-09-10 MED ORDER — SIMETHICONE 80 MG PO CHEW: 80.0000 mg | CHEWABLE_TABLET | ORAL | Status: DC | PRN

## 2019-09-10 MED ORDER — BENZOCAINE-MENTHOL 20-0.5 % EX AERO
1.0000 "application " | INHALATION_SPRAY | CUTANEOUS | Status: DC | PRN
  Administered 2019-09-10: 1 via TOPICAL
  Filled 2019-09-10: qty 56

## 2019-09-10 MED ORDER — OXYTOCIN 40 UNITS IN NORMAL SALINE INFUSION - SIMPLE MED
INTRAVENOUS | Status: AC
  Filled 2019-09-10: qty 1000

## 2019-09-10 MED ORDER — TETANUS-DIPHTH-ACELL PERTUSSIS 5-2.5-18.5 LF-MCG/0.5 IM SUSP: 0.5000 mL | Freq: Once | INTRAMUSCULAR | Status: DC

## 2019-09-10 MED ORDER — PHENYLEPHRINE 40 MCG/ML (10ML) SYRINGE FOR IV PUSH (FOR BLOOD PRESSURE SUPPORT)
PREFILLED_SYRINGE | INTRAVENOUS | Status: AC
  Filled 2019-09-10: qty 10

## 2019-09-10 MED ORDER — LACTATED RINGERS IV SOLN: INTRAVENOUS | Status: DC

## 2019-09-10 MED ORDER — ONDANSETRON HCL 4 MG/2ML IJ SOLN
4.0000 mg | Freq: Four times a day (QID) | INTRAMUSCULAR | Status: DC | PRN
  Administered 2019-09-10: 12:00:00 4 mg via INTRAVENOUS
  Filled 2019-09-10: qty 2

## 2019-09-10 MED ORDER — TRANEXAMIC ACID-NACL 1000-0.7 MG/100ML-% IV SOLN: 1000.0000 mg | INTRAVENOUS | Status: AC

## 2019-09-10 MED ORDER — PENICILLIN G POT IN DEXTROSE 60000 UNIT/ML IV SOLN
3.0000 10*6.[IU] | INTRAVENOUS | Status: DC
  Administered 2019-09-10: 11:00:00 3 10*6.[IU] via INTRAVENOUS
  Filled 2019-09-10: qty 50

## 2019-09-10 MED ORDER — WITCH HAZEL-GLYCERIN EX PADS: 1.0000 "application " | MEDICATED_PAD | CUTANEOUS | Status: DC | PRN

## 2019-09-10 MED ORDER — SODIUM CHLORIDE 0.9 % IV SOLN
5.0000 10*6.[IU] | Freq: Once | INTRAVENOUS | Status: AC
  Filled 2019-09-10: qty 5

## 2019-09-10 MED ORDER — MISOPROSTOL 200 MCG PO TABS
1000.0000 ug | ORAL_TABLET | Freq: Once | ORAL | Status: AC
  Administered 2019-09-10: 1000 ug via RECTAL

## 2019-09-10 MED ORDER — LACTATED RINGERS IV SOLN
500.0000 mL | INTRAVENOUS | Status: DC | PRN
  Administered 2019-09-10 (×2): 500 mL via INTRAVENOUS

## 2019-09-10 MED ORDER — TRANEXAMIC ACID-NACL 1000-0.7 MG/100ML-% IV SOLN
INTRAVENOUS | Status: AC
  Administered 2019-09-10: 12:00:00 1000 mg
  Filled 2019-09-10: qty 100

## 2019-09-10 MED ORDER — LACTATED RINGERS IV SOLN
500.0000 mL | Freq: Once | INTRAVENOUS | Status: AC
  Administered 2019-09-10: 08:00:00 500 mL via INTRAVENOUS

## 2019-09-10 MED ORDER — ONDANSETRON HCL 4 MG PO TABS: 4.0000 mg | ORAL_TABLET | ORAL | Status: DC | PRN

## 2019-09-10 MED ORDER — ACETAMINOPHEN 325 MG PO TABS: 650.0000 mg | ORAL_TABLET | ORAL | Status: DC | PRN

## 2019-09-10 MED ORDER — ACETAMINOPHEN 325 MG PO TABS
650.0000 mg | ORAL_TABLET | ORAL | Status: DC | PRN
  Administered 2019-09-10 – 2019-09-11 (×3): 650 mg via ORAL
  Filled 2019-09-10 (×2): qty 2

## 2019-09-10 MED ORDER — SOD CITRATE-CITRIC ACID 500-334 MG/5ML PO SOLN: 30.0000 mL | ORAL | Status: DC | PRN

## 2019-09-10 MED ORDER — LIDOCAINE HCL (PF) 1 % IJ SOLN: 30.0000 mL | INTRAMUSCULAR | Status: DC | PRN

## 2019-09-10 MED ORDER — SODIUM CHLORIDE (PF) 0.9 % IJ SOLN
INTRAMUSCULAR | Status: DC | PRN
  Administered 2019-09-10: 12 mL/h via EPIDURAL

## 2019-09-10 MED ORDER — LIDOCAINE HCL (PF) 1 % IJ SOLN
INTRAMUSCULAR | Status: DC | PRN
  Administered 2019-09-10 (×2): 4 mL via EPIDURAL

## 2019-09-10 MED ORDER — MISOPROSTOL 200 MCG PO TABS
ORAL_TABLET | ORAL | Status: AC
  Filled 2019-09-10: qty 5

## 2019-09-10 NOTE — Lactation Note (Signed)
This note was copied from a baby's chart. Lactation Consultation Note  Patient Name: Amanda Davies [id number] Date: 09/10/2019 Reason for consult: Initial assessment;Early term 37-38.6wks;Infant < 6lbs Mom is an experienced breastfeeding mom.  Reports she plans to breastfeed and formula feed.  Mom reports she has her own DEBP with this baby.  Not sure brand. She previously had  Two babies LPTI so borrowed pumps from [facility].  Mom reports she feels she is breastfeeding well.  Urged to add pumping with DEBP to breastfeeding.  Mom reports she will think about it but really wants to do both breastfeeding and formula feeding.  Mom has used multi user hospital breastpump before.  Praised breastfeeding.  Urged to call lactation as needed.  Maternal Data Formula Feeding for Exclusion: No Has patient been taught Hand Expression?: Yes(mom says she knows how) Does the patient have breastfeeding experience prior to this delivery?: Yes  Feeding    LATCH Score                   Interventions Interventions: Breast feeding basics reviewed  Lactation Tools Discussed/Used WIC Program: Yes   Consult Status Consult Status: Follow-up Date: 09/11/19 Follow-up type: In-patient    Amanda Davies Amanda Davies 09/10/2019, 11:01 PM

## 2019-09-10 NOTE — Anesthesia Procedure Notes (Signed)
Epidural Patient location during procedure: OB Start time: 09/10/2019 8:09 AM End time: 09/10/2019 8:12 AM  Staffing Anesthesiologist: Kaylyn Layer, MD Performed: anesthesiologist   Preanesthetic Checklist Completed: patient identified, IV checked, risks and benefits discussed, monitors and equipment checked, pre-op evaluation and timeout performed  Epidural Patient position: sitting Prep: DuraPrep and site prepped and draped Patient monitoring: continuous pulse ox, blood pressure and heart rate Approach: midline Location: L3-L4 Injection technique: LOR air  Needle:  Needle type: Tuohy  Needle gauge: 17 G Needle length: 9 cm Needle insertion depth: 5 cm Catheter type: closed end flexible Catheter size: 19 Gauge Catheter at skin depth: 9 cm Test dose: negative and Other (1% lidocaine)  Assessment Events: blood not aspirated, injection not painful, no injection resistance, no paresthesia and negative IV test  Additional Notes Patient identified. Risks, benefits, and alternatives discussed with patient including but not limited to bleeding, infection, nerve damage, paralysis, failed block, incomplete pain control, headache, blood pressure changes, nausea, vomiting, reactions to medication, itching, and postpartum back pain. Confirmed with bedside nurse the patient's most recent platelet count. Confirmed with patient that they are not currently taking any anticoagulation, have any bleeding history, or any family history of bleeding disorders. Patient expressed understanding and wished to proceed. All questions were answered. Sterile technique was used throughout the entire procedure. Please see nursing notes for vital signs.   Crisp LOR on first pass. Test dose was given through epidural catheter and negative prior to continuing to dose epidural or start infusion. Warning signs of high block given to the patient including shortness of breath, tingling/numbness in hands, complete motor  block, or any concerning symptoms with instructions to call for help. Patient was given instructions on fall risk and not to get out of bed. All questions and concerns addressed with instructions to call with any issues or inadequate analgesia.  Reason for block:procedure for pain

## 2019-09-10 NOTE — Discharge Summary (Signed)
Postpartum Discharge Summary    Patient Name: Amanda Davies DOB: Aug 15, 2000 MRN: [id number]  Date of admission: 09/10/2019 Delivering Provider: Merilyn Baba   Date of discharge: 09/11/2019  Admitting diagnosis: PROM (premature rupture of membranes) [O42.90] Intrauterine pregnancy: [redacted]w[redacted]d    Secondary diagnosis:  Active Problems:   H/O preterm delivery, currently pregnant   Supervision of high risk pregnancy, antepartum   Pyelonephritis affecting pregnancy   Alpha thalassemia silent carrier   GBS bacteriuria   Insufficient prenatal care in third trimester   PROM (premature rupture of membranes)  Additional problems: None     Discharge diagnosis: Term Pregnancy Delivered                                                                                                Post partum procedures: Depo  Augmentation: AROM  Complications: None  Hospital course:  Onset of Labor With Vaginal Delivery     19y.o. yo [id number] 21w2das admitted in Active Labor on 09/10/2019. Patient had an uncomplicated labor course as follows:   Admitted in active labor, progressed to complete without augmentation. AROM was performed and the patient delivered shortly after.  Membrane Rupture Time/Date: 11:43 AM ,09/10/2019   Intrapartum Procedures: Episiotomy: None [1]                                         Lacerations:  None [1]  Patient had a delivery of a Viable infant. 09/10/2019  Information for the patient's newborn:  McYazleemar, Strassner0[[id number]]Delivery Method: Vag-Spont     Pateint had an uncomplicated postpartum course.  She is ambulating, tolerating a regular diet, passing flatus, and urinating well. Patient is discharged home in stable condition on 09/11/19.  Delivery time: 11:56 AM    Magnesium Sulfate received: No BMZ received: No Rhophylac:N/A MMR:N/A Transfusion:No  Physical exam  Vitals:   09/10/19 1500 09/10/19 1845 09/11/19 0003 09/11/19 0545  BP: 96/63 98/64 102/73  110/86  Pulse: 84 82 84 74  Resp: '18 18 18 18  ' Temp: 98.2 F (36.8 C) 98.2 F (36.8 C) 99.3 F (37.4 C) 98.3 F (36.8 C)  TempSrc: Oral Oral Oral Oral  SpO2: 100% 100% 100% 100%  Weight:      Height:       General: alert, cooperative and no distress Lochia: appropriate Uterine Fundus: firm Incision: n/a DVT Evaluation: No evidence of DVT seen on physical exam. Labs: Lab Results  Component Value Date   WBC 13.0 (H) 09/11/2019   HGB 9.8 (L) 09/11/2019   HCT 32.3 (L) 09/11/2019   MCV 77.1 (L) 09/11/2019   PLT 219 09/11/2019   CMP Latest Ref Rng & Units 03/05/2019  Glucose 70 - 99 mg/dL 91  BUN 6 - 20 mg/dL 7  Creatinine 0.44 - 1.00 mg/dL 0.80  Sodium 135 - 145 mmol/L 134(L)  Potassium 3.5 - 5.1 mmol/L 3.6  Chloride 98 - 111 mmol/L 98  CO2 22 - 32 mmol/L 23  Calcium 8.9 - 10.3 mg/dL 9.6  Total Protein 6.5 - 8.1 g/dL -  Total Bilirubin 0.3 - 1.2 mg/dL -  Alkaline Phos 38 - 126 U/L -  AST 15 - 41 U/L -  ALT 0 - 44 U/L -   Edinburgh Score: Edinburgh Postnatal Depression Scale Screening Tool 09/11/2019  I have been able to laugh and see the funny side of things. 0  I have looked forward with enjoyment to things. 0  I have blamed myself unnecessarily when things went wrong. 0  I have been anxious or worried for no good reason. 0  I have felt scared or panicky for no good reason. 0  Things have been getting on top of me. 0  I have been so unhappy that I have had difficulty sleeping. 0  I have felt sad or miserable. 0  I have been so unhappy that I have been crying. 0  The thought of harming myself has occurred to me. 0  Edinburgh Postnatal Depression Scale Total 0    Discharge instruction: per After Visit Summary and "Baby and Me Booklet".  After visit meds:  Allergies as of 09/11/2019   No Known Allergies     Medication List    STOP taking these medications   Accu-Chek Guide test strip Generic drug: glucose blood   Accu-Chek Guide w/Device Kit   Accu-Chek  Softclix Lancets lancets   Blood Pressure Kit [place] Supp Med Misc     TAKE these medications   cyclobenzaprine 10 MG tablet Commonly known as: FLEXERIL Take 1 tablet (10 mg total) by mouth every 8 (eight) hours as needed for muscle spasms.   famotidine 20 MG tablet Commonly known as: PEPCID Take 1 tablet (20 mg total) by mouth 2 (two) times daily.   ibuprofen 600 MG tablet Commonly known as: ADVIL Take 1 tablet (600 mg total) by mouth every 6 (six) hours. What changed:   medication strength  how much to take  when to take this  reasons to take this   ondansetron 4 MG tablet Commonly known as: ZOFRAN Take 1-2 tablets (4-8 mg total) by mouth every 8 (eight) hours as needed for nausea or vomiting.   Vitafol Gummies 3.33-0.333-34.8 MG Chew Chew 1 Dose by mouth daily.       Diet: routine diet  Activity: Advance as tolerated. Pelvic rest for 6 weeks.   Outpatient follow up:4 weeks Follow up Appt:No future appointments. Follow up Visit: [place] Follow up.   Why: in 4 weeks for a postpartum exam Contact information: [address] [place] [address] [phone]         Please schedule this patient for Postpartum visit in: 4 weeks with the following provider: Any provider Virtual For C/S patients schedule nurse incision check in weeks 2 weeks: no Low risk pregnancy complicated by: insufficient prenatal care Delivery mode:  SVD Anticipated Birth Control:  Depo PP Procedures needed: None  Schedule Integrated BH visit: no  Newborn Data: Live born female  Birth Weight:  2679g APGAR: 7, 9  Newborn Delivery   Birth date/time: 09/10/2019 11:56:00 Delivery type: Vaginal, Spontaneous      Baby Feeding: Breast Disposition:home with mother   09/11/2019 Wende Mott, CNM

## 2019-09-10 NOTE — Progress Notes (Signed)
Amanda Davies is a 19 y.o. [phone] at [redacted]w[redacted]d admitted for normal labor  Subjective: Comfortable with epidural  Objective: BP (!) 81/41   Pulse (!) 113   Temp 98 F (36.7 C) (Oral)   Resp 16   Ht 5\' 1"  (1.549 m)   Wt 59.1 kg   SpO2 100%   BMI 24.60 kg/m  No intake/output data recorded.  FHT:  FHR: 140 bpm, variability: moderate,  accelerations:  Present,  decelerations:  Absent UC:   regular, every 2-3 minutes  SVE:   Dilation: 8 Effacement (%): 90 Station: -1 Exam by:: [email or web address], RN  Labs: Lab Results  Component Value Date   WBC 8.5 09/10/2019   HGB 10.7 (L) 09/10/2019   HCT 35.8 (L) 09/10/2019   MCV 77.3 (L) 09/10/2019   PLT 244 09/10/2019    Assessment / Plan: 19 yo [id number] at [redacted]w[redacted]d EGA here for active labor  Labor: Expectant management, consider augmentation after adequate GBS prophylaxis completed Fetal Wellbeing:  Category I Pain Control:  Epidural I/D:  GBS+, PCN Anticipated MOD:  NSVD  Ronika Kelson L Aissa Lisowski DO OB Fellow, Faculty Practice 09/10/2019, 9:05 AM

## 2019-09-10 NOTE — H&P (Signed)
LABOR AND DELIVERY ADMISSION HISTORY AND PHYSICAL NOTE  Amanda Davies is a 19 y.o. female [phone] with IUP at 84w2dby 30 wk UKoreapresenting for SOL.   Contractions since 0400 She reports positive fetal movement. She denies leakage of fluid, vaginal bleeding.   She plans on breast feeding. Her contraception plan is: Depo.  Pren[facility] History/Complications[facility]  '@[redacted]w[redacted]d' , CWD, normal anatomy, cephalic presentation, anterior placenta, 29%ile, EFW 2157 g  Pregnancy complications:  - late to prenatal care, insufficient prenatal care in third trimester   Past Medical History: Past Medical History:  Diagnosis Date  . Acne   . Asthma    when younger  . Chlamydia   . Hx of pre-term labor     Past Surgical History: Past Surgical History:  Procedure Laterality Date  . NO PAST SURGERIES      Obstetrical History: OB History    Gravida  5   Para  3   Term  1   Preterm  2   AB  1   Living  3     SAB  1   TAB      Ectopic      Multiple  0   Live Births  3           Social History: Social History   Socioeconomic History  . Marital status: Single    Spouse name: Not on file  . Number of children: 2  . Years of education: 17 . Highest education level: High school graduate  Occupational History  . Not on file  Tobacco Use  . Smoking status: Never Smoker  . Smokeless tobacco: Never Used  Substance and Sexual Activity  . Alcohol use: No  . Drug use: No  . Sexual activity: Not Currently    Partners: Male    Birth control/protection: None  Other Topics Concern  . Not on file  Social History Narrative  . Not on file   Social Determinants of Health   Financial Resource Strain:   . Difficulty of Paying Living Expenses:   Food Insecurity:   . Worried About RCharity fundraiserin the Last Year:   . RArboriculturistin the Last Year:   Transportation Needs:   . LFilm/video editor(Medical):   .Marland KitchenLack of Transportation (Non-Medical):    Physical Activity:   . Days of Exercise per Week:   . Minutes of Exercise per Session:   Stress:   . Feeling of Stress :   Social Connections:   . Frequency of Communication with Friends and Family:   . Frequency of Social Gatherings with Friends and Family:   . Attends Religious Services:   . Active Member of Clubs or Organizations:   . Attends CArchivistMeetings:   .Marland KitchenMarital Status:     Family History: Family History  Problem Relation Age of Onset  . Anxiety disorder Mother   . Asthma Sister   . Asthma Brother     Allergies: No Known Allergies  Medications Prior to Admission  Medication Sig Dispense Refill Last Dose  . ondansetron (ZOFRAN) 4 MG tablet Take 1-2 tablets (4-8 mg total) by mouth every 8 (eight) hours as needed for nausea or vomiting. 30 tablet 1 Past Month at Unknown time  . Accu-Chek Softclix Lancets lancets 1 each by Other route 4 (four) times daily. Use as instructed (Patient not taking: Reported on 08/16/2019) 100 each 12   . Blood  Glucose Monitoring Suppl (ACCU-CHEK GUIDE) w/Device KIT 1 Units by Does not apply route in the morning, at noon, in the evening, and at bedtime. (Patient not taking: Reported on 08/16/2019) 1 kit 0   . Blood Pressure Monitoring (BLOOD PRESSURE KIT) DEVI 1 Device by Does not apply route as needed. (Patient not taking: Reported on 08/16/2019) 1 Device 0   . cyclobenzaprine (FLEXERIL) 10 MG tablet Take 1 tablet (10 mg total) by mouth every 8 (eight) hours as needed for muscle spasms. (Patient not taking: Reported on 08/16/2019) 30 tablet 0   . Elastic Bandages & Supports (COMFORT FIT MATERNITY SUPP MED) MISC 1 Units by Does not apply route daily. (Patient not taking: Reported on 08/16/2019) 1 each 0   . famotidine (PEPCID) 20 MG tablet Take 1 tablet (20 mg total) by mouth 2 (two) times daily. (Patient not taking: Reported on 08/16/2019) 30 tablet 0   . glucose blood (ACCU-CHEK GUIDE) test strip Use as instructed (Patient not taking:  Reported on 08/16/2019) 100 each 12   . ibuprofen (ADVIL) 800 MG tablet Take 1 tablet (800 mg total) by mouth every 8 (eight) hours as needed. (Patient not taking: Reported on 07/26/2019) 10 tablet 0   . Prenatal Vit-Fe Phos-FA-Omega (VITAFOL GUMMIES) 3.33-0.333-34.8 MG CHEW Chew 1 Dose by mouth daily. 90 tablet 4 Unknown at Unknown time     Review of Systems  All systems reviewed and negative except as stated in HPI  Physical Exam Blood pressure 108/67, pulse (!) 102, temperature 98.6 F (37 C), temperature source Oral, resp. rate 18, weight 59.1 kg, unknown if currently breastfeeding. General appearance: alert, oriented, NAD Lungs: normal respiratory effort Heart: regular rate Abdomen: soft, non-tender; gravid Extremities: No calf swelling or tenderness Presentation: cephalic by RN SVE  Fetal monitoringBaseline: 140 bpm, Variability: Good {> 6 bpm), Accelerations: absent and Decelerations: Absent Uterine activity: poor tracing  Dilation: 7 Effacement (%): 90 Station: -1 Exam by:: Patricia Pesa RN  Prenatal labs: ABO, Rh: O/Positive/-- (10/20 1511) Antibody: Negative (10/20 1511) Rubella: 18.50 (10/20 1511) RPR: Non Reactive (03/18 1616)  HBsAg: Negative (10/20 1511)  HIV: Non Reactive (03/18 1616)  GC/Chlamydia: neg/neg 04/18/2020, repeat pending GBS:   + by urine culture 2-hr GTT: refused Genetic screening:  Low risk Panorama Anatomy US: most recent was normal  Prenatal Transfer Tool  Maternal Diabetes: unknown, declined screening Genetic Screening: Normal Maternal Ultrasounds/Referrals: Normal Fetal Ultrasounds or other Referrals:  None Maternal Substance Abuse:  No Significant Maternal Medications:  None Significant Maternal Lab Results: Group B Strep positive  No results found for this or any previous visit (from the past 24 hour(s)).  Patient Active Problem List   Diagnosis Date Noted  . PROM (premature rupture of membranes) 09/10/2019  . Insufficient prenatal  care in third trimester 07/21/2019  . GBS bacteriuria 04/25/2019  . Alpha thalassemia silent carrier 03/24/2019  . Pyelonephritis affecting pregnancy 03/05/2019  . Supervision of high risk pregnancy, antepartum 11/27/2016  . H/O preterm delivery, currently pregnant 01/01/2016    Assessment: Amanda Davies is a 19 y.o. [phone] at 54w2dhere for SOL.  #Labor: expectant management, pitocin augmentation PRN #Pain: IV pain meds PRN, epidural upon request #FWB: Cat I #GBS/ID: Positive, start penicillin #COVID: swab pending #MOF: Breast #MOC: Depo #Circ: n/a #Refused GDM screening: CBG q2h #Insufficient prenatal care: SW consult PP  MAnnice NeedyEFour Seasons Surgery [facility] LP5/12/2019, 7:19 AM

## 2019-09-10 NOTE — Anesthesia Postprocedure Evaluation (Signed)
Anesthesia Post Note  Patient: Amanda Davies  Procedure(s) Performed: AN AD HOC LABOR EPIDURAL     Patient location during evaluation: Mother Baby Anesthesia Type: Epidural Level of consciousness: awake and alert, oriented and patient cooperative Pain management: pain level controlled Vital Signs Assessment: post-procedure vital signs reviewed and stable Respiratory status: spontaneous breathing Cardiovascular status: stable Postop Assessment: no headache, epidural receding, patient able to bend at knees and no signs of nausea or vomiting Anesthetic complications: no Comments: Pt. States she is walking.  Reports pain score of "mild" 5.  Pt is receiving pain meds and states pain meds are working effectively.     Last Vitals:  Vitals:   09/10/19 1405 09/10/19 1500  BP: 91/62 96/63  Pulse: 85 84  Resp: 18 18  Temp: 36.7 C 36.8 C  SpO2: 100% 100%    Last Pain:  Vitals:   09/10/19 1612  TempSrc:   PainSc: 0-No pain   Pain Goal: Patients Stated Pain Goal: 0 (09/10/19 0641)                 Merrilyn Puma

## 2019-09-10 NOTE — Anesthesia Preprocedure Evaluation (Signed)
Anesthesia Evaluation  Patient identified by MRN, date of birth, ID band Patient awake    Reviewed: Allergy & Precautions, Patient's Chart, lab work & pertinent test results  History of Anesthesia Complications Negative for: history of anesthetic complications  Airway Mallampati: II  TM Distance: >3 FB Neck ROM: Full    Dental no notable dental hx.    Pulmonary neg pulmonary ROS,    Pulmonary exam normal        Cardiovascular negative cardio ROS Normal cardiovascular exam     Neuro/Psych negative neurological ROS  negative psych ROS   GI/Hepatic negative GI ROS, Neg liver ROS,   Endo/Other  negative endocrine ROS  Renal/GU negative Renal ROS  negative genitourinary   Musculoskeletal negative musculoskeletal ROS (+)   Abdominal   Peds  Hematology negative hematology ROS (+)   Anesthesia Other Findings Day of surgery medications reviewed with patient.  Reproductive/Obstetrics (+) Pregnancy                             Anesthesia Physical Anesthesia Plan  ASA: II  Anesthesia Plan: Epidural   Post-op Pain Management:    Induction:   PONV Risk Score and Plan: Treatment may vary due to age or medical condition  Airway Management Planned: Natural Airway  Additional Equipment:   Intra-op Plan:   Post-operative Plan:   Informed Consent: I have reviewed the patients History and Physical, chart, labs and discussed the procedure including the risks, benefits and alternatives for the proposed anesthesia with the patient or authorized representative who has indicated his/her understanding and acceptance.       Plan Discussed with:   Anesthesia Plan Comments:         Anesthesia Quick Evaluation  

## 2019-09-10 NOTE — MAU Note (Signed)
. .  Amanda Davies is a 19 y.o. at [redacted]w[redacted]d here in MAU reporting: ctx every 1-2 min. Pt reports loosing her mucous plug and seeing some blood in it. No LOF. +FM. Onset of complaint: 0400  Pain score: 10/10 Vitals:   09/10/19 0636  BP: 108/67  Pulse: (!) 102  Resp: 18  Temp: 98.6 F (37 C)

## 2019-09-10 NOTE — Discharge Instructions (Signed)

## 2019-09-10 NOTE — Lactation Note (Signed)
This note was copied from a baby's chart. Lactation Consultation Note  Patient Name: Amanda Davies [id number] Date: 09/10/2019  Baby 3hrs old. Mom asleep in bed, baby asleep in bassinet swaddled, maternal grandmother at bedside states BF going well states baby fed at the breast and took ~20ml formula. Advised an LC will return when mother is awake, mom to call if needs support prior to return. Erenest Rasher, RN, IBCLC                  Charlynn Court 09/10/2019, 3:13 PM

## 2019-09-11 LAB — CBC
HCT: 32.3 % — ABNORMAL LOW (ref 36.0–46.0)
Hemoglobin: 9.8 g/dL — ABNORMAL LOW (ref 12.0–15.0)
MCH: 23.4 pg — ABNORMAL LOW (ref 26.0–34.0)
MCHC: 30.3 g/dL (ref 30.0–36.0)
MCV: 77.1 fL — ABNORMAL LOW (ref 80.0–100.0)
Platelets: 219 10*3/uL (ref 150–400)
RBC: 4.19 MIL/uL (ref 3.87–5.11)
RDW: 14.8 % (ref 11.5–15.5)
WBC: 13 10*3/uL — ABNORMAL HIGH (ref 4.0–10.5)
nRBC: 0 % (ref 0.0–0.2)

## 2019-09-11 MED ORDER — IBUPROFEN 600 MG PO TABS: 600.0000 mg | ORAL_TABLET | Freq: Four times a day (QID) | ORAL | 0 refills | Status: DC

## 2019-09-11 NOTE — Progress Notes (Signed)
CSW acknowledged consult for MOB for limited prenatal care.  CSW screened out consult, after completing MOB's chart review.  CSW was able to confirm at least 3 prenatal visit (3 or less prenatal visits will require a visit by CSW). Prenatal visits in chart: [redacted]w[redacted]d 02/22/19, [redacted]w[redacted]d 03/24/19, [redacted]w[redacted]d 06/03/19, [redacted]w[redacted]d 07/21/19 and [redacted]w[redacted]d 08/16/19.  Junior Huezo D. Samantha Olivera, MSW, LCSWA Clinical Social Worker [phone]  

## 2019-09-11 NOTE — Lactation Note (Signed)
This note was copied from a baby's chart. Lactation Consultation Note  Patient Name: Amanda Davies [id number] Date: 09/11/2019 Reason for consult: Follow-up assessment;Early term 37-38.6wks  LC in to visit with P4 Mom of ET infant at 45 hrs old. Baby at 3.3% weight loss. Mom has been breast and formula feeding baby per her choice.   Mom thinks she and baby are being discharged today.  Mom denies any difficulty with latching or having any questions.    Encouraged STS and feeding baby often with cues.  Encouraged latching baby to breast prior to offering formula.  Encouraged a deep latch, Mom states baby latches great and denies any discomfort.  Engorgement prevention and treatment discussed. Mom aware of OP lactation support.  Encouraged to ask for help prn.   Consult Status Consult Status: Complete Date: 09/11/19 Follow-up type: Call as needed    [name] 09/11/2019, 12:34 PM

## 2019-09-12 MED ORDER — MEDROXYPROGESTERONE ACETATE 150 MG/ML IM SUSP
150.0000 mg | Freq: Once | INTRAMUSCULAR | Status: AC
  Administered 2019-09-12: 150 mg via INTRAMUSCULAR
  Filled 2019-09-12: qty 1

## 2019-09-12 NOTE — Lactation Note (Signed)
This note was copied from a baby's chart. Lactation Consultation Note  Patient Name: [name] [id number] Date: 09/12/2019 Reason for consult: Follow-up assessment;Infant < 6lbs;Early term 56-38.6wks  [name] now 63 hours old. Mom reports she feels they are breastfeeding well.  Doing some occasional formula past breastfeeding. Discussed offering mothers milk past breastfeeding.  Mother reports no one ever started her pumping.  Mom reports she is going home and has a breastpump at home and she will start then.  Discussed pumping about 15 minutes past breastfeedings and feeding her back her milk and then formula as needed or prescribed. . Mom reports she has done this before and she feels comfortable going home.  Urged to call lactation as needed. Maternal Data    Feeding Feeding Type: Breast Fed  LATCH Score Latch: Grasps breast easily, tongue down, lips flanged, rhythmical sucking.  Audible Swallowing: Spontaneous and intermittent  Type of Nipple: Everted at rest and after stimulation  Comfort (Breast/Nipple): Soft / non-tender  Hold (Positioning): No assistance needed to correctly position infant at breast.(sidelying at this feeding)  LATCH Score: 10  Interventions Interventions: DEBP;Breast feeding basics reviewed  Lactation Tools Discussed/Used WIC Program: Yes   Consult Status Consult Status: Complete Date: 09/12/19 Follow-up type: Call as needed    [facility] 09/12/2019, 10:55 AM

## 2019-09-12 NOTE — Lactation Note (Signed)
This note was copied from a baby's chart. Lactation Consultation Note Attempted to see mom, everyone in room sleeping.  Patient Name: Amanda Davies Date: 09/12/2019     Maternal Data    Feeding Feeding Type: Breast Fed  LATCH Score                   Interventions    Lactation Tools Discussed/Used     Consult Status      Charyl Dancer 09/12/2019, 5:41 AM

## 2019-09-12 NOTE — Discharge Summary (Signed)
Postpartum Discharge Summary    Patient Name: Amanda Davies DOB: December 23, 2000 MRN: [id number]  Date of admission: 09/10/2019 Delivering Provider: Merilyn Baba   Date of discharge: 09/12/2019  Admitting diagnosis: PROM (premature rupture of membranes) [O42.90] Intrauterine pregnancy: [redacted]w[redacted]d    Secondary diagnosis:  Active Problems:   H/O preterm delivery, currently pregnant   Supervision of high risk pregnancy, antepartum   Pyelonephritis affecting pregnancy   Alpha thalassemia silent carrier   GBS bacteriuria   Insufficient prenatal care in third trimester   PROM (premature rupture of membranes)  Additional problems: None     Discharge diagnosis: Term Pregnancy Delivered                                                                                                Post partum procedures: Depo  Augmentation: AROM  Complications: None  Hospital course:  Onset of Labor With Vaginal Delivery     19y.o. yo [phone] 3109w2das admitted in Active Labor on 09/10/2019. Patient had an uncomplicated labor course as follows:   Admitted in active labor, progressed to complete without augmentation. AROM was performed and the patient delivered shortly after.  Membrane Rupture Time/Date: 11:43 AM ,09/10/2019   Intrapartum Procedures: Episiotomy: None [1]                                         Lacerations:  None [1]  Patient had a delivery of a Viable infant. 09/10/2019  Information for the patient's newborn:  McLizbett, Garciagarcia0[[id number]]Delivery Method: Vag-Spont     Pateint had an uncomplicated postpartum course.  She is ambulating, tolerating a regular diet, passing flatus, and urinating well. Patient is discharged home in stable condition on 09/12/19.  Delivery time: 11:56 AM    Magnesium Sulfate received: No BMZ received: No Rhophylac:N/A MMR:N/A Transfusion:No  Physical exam  Vitals:   09/11/19 0003 09/11/19 0545 09/11/19 1512 09/11/19 2220  BP: 102/73 110/86  101/63 107/77  Pulse: 84 74 78 77  Resp: _0 Temp: 99.3 F (37.4 C) 98.3 F (36.8 C) 97.7 F (36.5 C) 97.6 F (36.4 C)  TempSrc: Oral Oral  Oral  SpO2: 100% 100% 100%   Weight:      Height:       General: alert, cooperative and no distress Lochia: appropriate Uterine Fundus: firm Incision: n/a DVT Evaluation: No evidence of DVT seen on physical exam. Labs: Lab Results  Component Value Date   WBC 13.0 (H) 09/11/2019   HGB 9.8 (L) 09/11/2019   HCT 32.3 (L) 09/11/2019   MCV 77.1 (L) 09/11/2019   PLT 219 09/11/2019   CMP Latest Ref Rng & Units 03/05/2019  Glucose 70 - 99 mg/dL 91  BUN 6 - 20 mg/dL 7  Creatinine 0.44 - 1.00 mg/dL 0.80  Sodium 135 - 145 mmol/L 134(L)  Potassium 3.5 - 5.1 mmol/L 3.6  Chloride 98 - 111 mmol/L 98  CO2 22 - 32 mmol/L 23  Calcium 8.9 - 10.3 mg/dL 9.6  Total Protein 6.5 - 8.1 g/dL -  Total Bilirubin 0.3 - 1.2 mg/dL -  Alkaline Phos 38 - 126 U/L -  AST 15 - 41 U/L -  ALT 0 - 44 U/L -   Edinburgh Score: Edinburgh Postnatal Depression Scale Screening Tool 09/11/2019  I have been able to laugh and see the funny side of things. 0  I have looked forward with enjoyment to things. 0  I have blamed myself unnecessarily when things went wrong. 0  I have been anxious or worried for no good reason. 0  I have felt scared or panicky for no good reason. 0  Things have been getting on top of me. 0  I have been so unhappy that I have had difficulty sleeping. 0  I have felt sad or miserable. 0  I have been so unhappy that I have been crying. 0  The thought of harming myself has occurred to me. 0  Edinburgh Postnatal Depression Scale Total 0    Discharge instruction: per After Visit Summary and "Baby and Me Booklet".  After visit meds:  Allergies as of 09/12/2019   No Known Allergies     Medication List    STOP taking these medications   Accu-Chek Guide test strip Generic drug: glucose blood   Accu-Chek Guide w/Device Kit   Accu-Chek  Softclix Lancets lancets   Blood Pressure Kit [place] Supp Med Misc     TAKE these medications   cyclobenzaprine 10 MG tablet Commonly known as: FLEXERIL Take 1 tablet (10 mg total) by mouth every 8 (eight) hours as needed for muscle spasms.   famotidine 20 MG tablet Commonly known as: PEPCID Take 1 tablet (20 mg total) by mouth 2 (two) times daily.   ibuprofen 600 MG tablet Commonly known as: ADVIL Take 1 tablet (600 mg total) by mouth every 6 (six) hours. What changed:   medication strength  how much to take  when to take this  reasons to take this   ondansetron 4 MG tablet Commonly known as: ZOFRAN Take 1-2 tablets (4-8 mg total) by mouth every 8 (eight) hours as needed for nausea or vomiting.   Vitafol Gummies 3.33-0.333-34.8 MG Chew Chew 1 Dose by mouth daily.       Diet: routine diet  Activity: Advance as tolerated. Pelvic rest for 6 weeks.   Outpatient follow up:4 weeks Follow up Appt:No future appointments. Follow up Visit: [place] Follow up.   Why: in 4 weeks for a postpartum exam Contact information: [address] [place] [id number] [phone]         Please schedule this patient for Postpartum visit in: 4 weeks with the following provider: Any provider Virtual For C/S patients schedule nurse incision check in weeks 2 weeks: no Low risk pregnancy complicated by: insufficient prenatal care Delivery mode:  SVD Anticipated Birth Control:  Depo PP Procedures needed: None  Schedule Integrated BH visit: no  Newborn Data: Live born female  Birth Weight:  2679g APGAR: 7, 9  Newborn Delivery   Birth date/time: 09/10/2019 11:56:00 Delivery type: Vaginal, Spontaneous      Baby Feeding: Breast Disposition:home with mother   09/12/2019 Chauncey Mann, MD

## 2019-10-14 ENCOUNTER — Telehealth: Payer: Medicaid Other | Admitting: Certified Nurse Midwife

## 2019-11-09 ENCOUNTER — Telehealth: Payer: Self-pay

## 2019-11-09 NOTE — Telephone Encounter (Signed)
A voicemail was left for Amanda Davies to contact our office to r/s her last appointment that she missed.

## 2020-01-05 ENCOUNTER — Ambulatory Visit: Payer: Medicaid Other

## 2020-05-05 NOTE — L&D Delivery Note (Signed)
OB/GYN Faculty Practice Delivery Note  Amanda Davies is a 20 y.o. [id number] s/p precipitous SVD at [redacted]w[redacted]d. She was admitted for SOL with strong urge to push.   ROM: 0h 22m with clear fluid GBS Status: Unknown, history of positive GBS in previous pregnancy Maximum Maternal Temperature: 97.8  Labor Progress: Pt arrived to MAU registration voicing need to push. Dilation complete upon entrance to MAU room and actively pushing so not safe to transfer.  Delivery Date/Time: 12/13/20 at 10/40am Delivery: Head delivered LOA. No nuchal cord present. Shoulder and body delivered in usual fashion. Infant with spontaneous cry, placed on mother's abdomen, dried and stimulated. Cord clamped x 2 after 1-minute delay, and cut by CNM. Cord blood drawn. Placenta delivered spontaneously, intact, with 3-vessel cord. Cord section collected. Fundus firm with massage and IM Pitocin. Labia, perineum, vagina, and cervix inspected, no laceration found.  Placenta: intact, 3v, to L&D Complications: None Lacerations: None EBL: 50 Analgesia: None  Postpartum Planning [x]  transfer orders to MB [X]  discharge summary started & shared [x]  message to sent to schedule follow-up  [x]  lists updated [x]  vaccines UTD  Infant: Boy (yes)  APGARs 7/9  2327g; 5lb 2.1oz  , CNM, IBCLC Certified Nurse Midwife, [facility] for , [facility] 12/13/2020, 11:59 AM

## 2020-05-07 ENCOUNTER — Ambulatory Visit: Payer: Medicaid Other

## 2020-05-28 ENCOUNTER — Inpatient Hospital Stay (HOSPITAL_COMMUNITY)
Admission: AD | Admit: 2020-05-28 | Discharge: 2020-05-28 | Disposition: A | Discharge status: Home / Self Care | Payer: Medicaid Other | Attending: Family Medicine | Admitting: Family Medicine

## 2020-05-28 ENCOUNTER — Other Ambulatory Visit: Payer: Self-pay

## 2020-05-28 ENCOUNTER — Inpatient Hospital Stay (HOSPITAL_COMMUNITY): Payer: Medicaid Other

## 2020-05-28 ENCOUNTER — Encounter (HOSPITAL_COMMUNITY): Payer: Self-pay | Admitting: Family Medicine

## 2020-05-28 DIAGNOSIS — R109 Unspecified abdominal pain: Secondary | ICD-10-CM | POA: Diagnosis not present

## 2020-05-28 DIAGNOSIS — O210 Mild hyperemesis gravidarum: Secondary | ICD-10-CM | POA: Diagnosis not present

## 2020-05-28 DIAGNOSIS — M549 Dorsalgia, unspecified: Secondary | ICD-10-CM | POA: Diagnosis not present

## 2020-05-28 DIAGNOSIS — O26891 Other specified pregnancy related conditions, first trimester: Secondary | ICD-10-CM | POA: Diagnosis not present

## 2020-05-28 DIAGNOSIS — O99891 Other specified diseases and conditions complicating pregnancy: Secondary | ICD-10-CM | POA: Insufficient documentation

## 2020-05-28 DIAGNOSIS — O26899 Other specified pregnancy related conditions, unspecified trimester: Secondary | ICD-10-CM

## 2020-05-28 DIAGNOSIS — M546 Pain in thoracic spine: Secondary | ICD-10-CM | POA: Insufficient documentation

## 2020-05-28 DIAGNOSIS — Z3A08 8 weeks gestation of pregnancy: Secondary | ICD-10-CM | POA: Diagnosis not present

## 2020-05-28 DIAGNOSIS — O219 Vomiting of pregnancy, unspecified: Secondary | ICD-10-CM | POA: Diagnosis not present

## 2020-05-28 DIAGNOSIS — R141 Gas pain: Secondary | ICD-10-CM

## 2020-05-28 DIAGNOSIS — Z3A01 Less than 8 weeks gestation of pregnancy: Secondary | ICD-10-CM | POA: Diagnosis not present

## 2020-05-28 LAB — CBC WITH DIFFERENTIAL/PLATELET
Abs Immature Granulocytes: 0.01 10*3/uL (ref 0.00–0.07)
Basophils Absolute: 0 10*3/uL (ref 0.0–0.1)
Basophils Relative: 0 %
Eosinophils Absolute: 0.1 10*3/uL (ref 0.0–0.5)
Eosinophils Relative: 1 %
HCT: 41.4 % (ref 36.0–46.0)
Hemoglobin: 13.9 g/dL (ref 12.0–15.0)
Immature Granulocytes: 0 %
Lymphocytes Relative: 25 %
Lymphs Abs: 1.6 10*3/uL (ref 0.7–4.0)
MCH: 25.5 pg — ABNORMAL LOW (ref 26.0–34.0)
MCHC: 33.6 g/dL (ref 30.0–36.0)
MCV: 76 fL — ABNORMAL LOW (ref 80.0–100.0)
Monocytes Absolute: 0.6 10*3/uL (ref 0.1–1.0)
Monocytes Relative: 9 %
Neutro Abs: 4.2 10*3/uL (ref 1.7–7.7)
Neutrophils Relative %: 65 %
Platelets: 250 10*3/uL (ref 150–400)
RBC: 5.45 MIL/uL — ABNORMAL HIGH (ref 3.87–5.11)
RDW: 14.6 % (ref 11.5–15.5)
WBC: 6.5 10*3/uL (ref 4.0–10.5)
nRBC: 0 % (ref 0.0–0.2)

## 2020-05-28 LAB — URINALYSIS, ROUTINE W REFLEX MICROSCOPIC
Bilirubin Urine: NEGATIVE
Glucose, UA: NEGATIVE mg/dL
Hgb urine dipstick: NEGATIVE
Ketones, ur: 80 mg/dL — AB
Nitrite: NEGATIVE
Protein, ur: 30 mg/dL — AB
Specific Gravity, Urine: 1.029 (ref 1.005–1.030)
pH: 7 (ref 5.0–8.0)

## 2020-05-28 LAB — WET PREP, GENITAL
Clue Cells Wet Prep HPF POC: NONE SEEN
Sperm: NONE SEEN
Trich, Wet Prep: NONE SEEN
WBC, Wet Prep HPF POC: NONE SEEN
Yeast Wet Prep HPF POC: NONE SEEN

## 2020-05-28 LAB — COMPREHENSIVE METABOLIC PANEL
ALT: 22 U/L (ref 0–44)
AST: 17 U/L (ref 15–41)
Albumin: 3.8 g/dL (ref 3.5–5.0)
Alkaline Phosphatase: 70 U/L (ref 38–126)
Anion gap: 12 (ref 5–15)
BUN: 6 mg/dL (ref 6–20)
CO2: 25 mmol/L (ref 22–32)
Calcium: 9.5 mg/dL (ref 8.9–10.3)
Chloride: 99 mmol/L (ref 98–111)
Creatinine, Ser: 0.68 mg/dL (ref 0.44–1.00)
GFR, Estimated: >60 mL/min (ref >60)
Glucose, Bld: 90 mg/dL (ref 70–99)
Potassium: 3.6 mmol/L (ref 3.5–5.1)
Sodium: 136 mmol/L (ref 135–145)
Total Bilirubin: 1.3 mg/dL — ABNORMAL HIGH (ref 0.3–1.2)
Total Protein: 7.2 g/dL (ref 6.5–8.1)

## 2020-05-28 LAB — HCG, QUANTITATIVE, PREGNANCY: hCG, Beta Chain, Quant, S: 262435 m[IU]/mL — ABNORMAL HIGH (ref <5)

## 2020-05-28 LAB — POCT PREGNANCY, URINE: Preg Test, Ur: POSITIVE — AB

## 2020-05-28 LAB — HIV ANTIBODY (ROUTINE TESTING W REFLEX): HIV Screen 4th Generation wRfx: NONREACTIVE

## 2020-05-28 MED ORDER — ONDANSETRON 8 MG PO TBDP: 4.0000 mg | ORAL_TABLET | Freq: Three times a day (TID) | ORAL | 1 refills | Status: DC | PRN

## 2020-05-28 MED ORDER — SODIUM CHLORIDE 0.9 % IV SOLN
8.0000 mg | Freq: Once | INTRAVENOUS | Status: AC
  Administered 2020-05-28: 8 mg via INTRAVENOUS
  Filled 2020-05-28: qty 4

## 2020-05-28 MED ORDER — ACETAMINOPHEN 500 MG PO TABS
1000.0000 mg | ORAL_TABLET | Freq: Once | ORAL | Status: AC
  Administered 2020-05-28: 1000 mg via ORAL
  Filled 2020-05-28: qty 2

## 2020-05-28 MED ORDER — LACTATED RINGERS IV BOLUS
1000.0000 mL | Freq: Once | INTRAVENOUS | Status: AC
  Administered 2020-05-28: 1000 mL via INTRAVENOUS

## 2020-05-28 MED ORDER — SIMETHICONE 80 MG PO CHEW: 80.0000 mg | CHEWABLE_TABLET | Freq: Four times a day (QID) | ORAL | 2 refills | Status: DC | PRN

## 2020-05-28 MED ORDER — FAMOTIDINE IN NACL 20-0.9 MG/50ML-% IV SOLN
20.0000 mg | Freq: Once | INTRAVENOUS | Status: AC
  Administered 2020-05-28: 20 mg via INTRAVENOUS
  Filled 2020-05-28: qty 50

## 2020-05-28 MED ORDER — GOODSENSE PRENATAL VITAMINS 28-0.8 MG PO TABS: 1.0000 | ORAL_TABLET | Freq: Every day | ORAL | 1 refills | Status: DC

## 2020-05-28 NOTE — Discharge Instructions (Signed)
https://www.cdc.gov/pregnancy/infections.html">  First Trimester of Pregnancy  The first trimester of pregnancy starts on the first day of your last menstrual period until the end of week 12. This is also called months 1 through 3 of pregnancy. Body changes during your first trimester Your body goes through many changes during pregnancy. The changes usually return to normal after your baby is born. Physical changes  You may gain or lose weight.  Your breasts may grow larger and hurt. The area around your nipples may get darker.  Dark spots or blotches may develop on your face.  You may have changes in your hair. Health changes  You may feel like you might vomit (nauseous), and you may vomit.  You may have heartburn.  You may have headaches.  You may have trouble pooping (constipation).  Your gums may bleed. Other changes  You may get tired easily.  You may pee (urinate) more often.  Your menstrual periods will stop.  You may not feel hungry.  You may want to eat certain kinds of food.  You may have changes in your emotions from day to day.  You may have more dreams. Follow these instructions at home: Medicines  Take over-the-counter and prescription medicines only as told by your doctor. Some medicines are not safe during pregnancy.  Take a prenatal vitamin that contains at least 600 micrograms (mcg) of folic acid. Eating and drinking  Eat healthy meals that include: ? Fresh fruits and vegetables. ? Whole grains. ? Good sources of protein, such as meat, eggs, or tofu. ? Low-fat dairy products.  Avoid raw meat and unpasteurized juice, milk, and cheese.  If you feel like you may vomit, or you vomit: ? Eat 4 or 5 small meals a day instead of 3 large meals. ? Try eating a few soda crackers. ? Drink liquids between meals instead of during meals.  You may need to take these actions to prevent or treat trouble pooping: ? Drink enough fluids to keep your pee  (urine) pale yellow. ? Eat foods that are high in fiber. These include beans, whole grains, and fresh fruits and vegetables. ? Limit foods that are high in fat and sugar. These include fried or sweet foods. Activity  Exercise only as told by your doctor. Most people can do their usual exercise routine during pregnancy.  Stop exercising if you have cramps or pain in your lower belly (abdomen) or low back.  Do not exercise if it is too hot or too humid, or if you are in a place of great height (high altitude).  Avoid heavy lifting.  If you choose to, you may have sex unless your doctor tells you not to. Relieving pain and discomfort  Wear a good support bra if your breasts are sore.  Rest with your legs raised (elevated) if you have leg cramps or low back pain.  If you have bulging veins (varicose veins) in your legs: ? Wear support hose as told by your doctor. ? Raise your feet for 15 minutes, 3-4 times a day. ? Limit salt in your food. Safety  Wear your seat belt at all times when you are in a car.  Talk with your doctor if someone is hurting you or yelling at you.  Talk with your doctor if you are feeling sad or have thoughts of hurting yourself. Lifestyle  Do not use hot tubs, steam rooms, or saunas.  Do not douche. Do not use tampons or scented sanitary pads.  Do not   use herbal medicines, illegal drugs, or medicines that are not approved by your doctor. Do not drink alcohol.  Do not smoke or use any products that contain nicotine or tobacco. If you need help quitting, ask your doctor.  Avoid cat litter boxes and soil that is used by cats. These carry germs that can cause harm to the baby and can cause a loss of your baby by miscarriage or stillbirth. General instructions  Keep all follow-up visits. This is important.  Ask for help if you need counseling or if you need help with nutrition. Your doctor can give you advice or tell you where to go for help.  Visit your  dentist. At home, brush your teeth with a soft toothbrush. Floss gently.  Write down your questions. Take them to your prenatal visits. Where to find more information  American Pregnancy Association: americanpregnancy.org  American College of Obstetricians and Gynecologists: www.acog.org  Office on Women's Health: womenshealth.gov/pregnancy Contact a doctor if:  You are dizzy.  You have a fever.  You have mild cramps or pressure in your lower belly.  You have a nagging pain in your belly area.  You continue to feel like you may vomit, you vomit, or you have watery poop (diarrhea) for 24 hours or longer.  You have a bad-smelling fluid coming from your vagina.  You have pain when you pee.  You are exposed to a disease that spreads from person to person, such as chickenpox, measles, Zika virus, HIV, or hepatitis. Get help right away if:  You have spotting or bleeding from your vagina.  You have very bad belly cramping or pain.  You have shortness of breath or chest pain.  You have any kind of injury, such as from a fall or a car crash.  You have new or increased pain, swelling, or redness in an arm or leg. Summary  The first trimester of pregnancy starts on the first day of your last menstrual period until the end of week 12 (months 1 through 3).  Eat 4 or 5 small meals a day instead of 3 large meals.  Do not smoke or use any products that contain nicotine or tobacco. If you need help quitting, ask your doctor.  Keep all follow-up visits. This information is not intended to replace advice given to you by your health care provider. Make sure you discuss any questions you have with your health care provider. Document Revised: 09/28/2019 Document Reviewed: 08/04/2019 Elsevier Patient Education  2021 Elsevier Inc.  

## 2020-05-28 NOTE — MAU Note (Signed)
Pt reports some yellow vaginal discharge.

## 2020-05-28 NOTE — MAU Note (Signed)
Pt reports positive home preg test one month ago, reports nausea and vomiting every day. For the last week she has been having mid bilateral back pain, also reports lower abd pain and "gas pain" under her ribs on the left side.

## 2020-05-28 NOTE — MAU Provider Note (Signed)
History     CSN: [id number]  Arrival date and time: 05/28/20 1049   Event Date/Time   First Provider Initiated Contact with Patient 05/28/20 1151      Chief Complaint  Patient presents with  . Abdominal Pain  . Back Pain   HPI   [name] is a 20 y.o. female [phone] @ unknown gestation here with complaints of left lower, left upper back pain, and upper back pain. The pain started 1 week ago. She rates her pain 8/10. She has not taken anything for the pain. The pain is constant. She reports N/V and requests Zofran. She has not taken anything for the nausea. She has no bleeding.   OB History    Gravida  6   Para  4   Term  2   Preterm  2   AB  1   Living  4     SAB  1   IAB      Ectopic      Multiple  0   Live Births  4           Past Medical History:  Diagnosis Date  . Acne   . Asthma    when younger  . Chlamydia   . Hx of pre-term labor     Past Surgical History:  Procedure Laterality Date  . NO PAST SURGERIES      Family History  Problem Relation Age of Onset  . Anxiety disorder Mother   . Asthma Sister   . Asthma Brother     Social History   Tobacco Use  . Smoking status: Never Smoker  . Smokeless tobacco: Never Used  Vaping Use  . Vaping Use: Never used  Substance Use Topics  . Alcohol use: No  . Drug use: No    Allergies: No Known Allergies  Medications Prior to Admission  Medication Sig Dispense Refill Last Dose  . cyclobenzaprine (FLEXERIL) 10 MG tablet Take 1 tablet (10 mg total) by mouth every 8 (eight) hours as needed for muscle spasms. (Patient not taking: Reported on 08/16/2019) 30 tablet 0   . famotidine (PEPCID) 20 MG tablet Take 1 tablet (20 mg total) by mouth 2 (two) times daily. (Patient not taking: Reported on 08/16/2019) 30 tablet 0   . ibuprofen (ADVIL) 600 MG tablet Take 1 tablet (600 mg total) by mouth every 6 (six) hours. 30 tablet 0   . ondansetron (ZOFRAN) 4 MG tablet Take 1-2 tablets (4-8 mg total)  by mouth every 8 (eight) hours as needed for nausea or vomiting. 30 tablet 1   . Prenatal Vit-Fe Phos-FA-Omega (VITAFOL GUMMIES) 3.33-0.333-34.8 MG CHEW Chew 1 Dose by mouth daily. 90 tablet 4    Results for orders placed or performed during the hospital encounter of 05/28/20 (from the past 72 hour(s))  Pregnancy, urine POC     Status: Abnormal   Collection Time: 05/28/20 11:00 AM  Result Value Ref Range   Preg Test, Ur POSITIVE (A) NEGATIVE    Comment:        THE SENSITIVITY OF THIS METHODOLOGY IS >24 mIU/mL   OB Urine Culture     Status: Abnormal (Preliminary result)   Collection Time: 05/28/20 11:02 AM   Specimen: OB Clean Catch; Urine  Result Value Ref Range   Specimen Description OB CLEAN CATCH    Special Requests NONE    Culture (A)     40,000 COLONIES/mL ESCHERICHIA COLI SUSCEPTIBILITIES TO FOLLOW NO GROUP B STREP (S.AGALACTIAE) ISOLATED  Performed at [facility], [address], [place], [place] [address]    Report Status PENDING   GC/Chlamydia probe amp ([place])not at [facility]     Status: None   Collection Time: 05/28/20 11:11 AM  Result Value Ref Range   Neisseria Gonorrhea Negative    Chlamydia Negative    Comment Normal Reference Ranger Chlamydia - Negative    Comment      Normal Reference Range Neisseria Gonorrhea - Negative  CBC with Differential/Platelet     Status: Abnormal   Collection Time: 05/28/20 11:23 AM  Result Value Ref Range   WBC 6.5 4.0 - 10.5 K/uL   RBC 5.45 (H) 3.87 - 5.11 MIL/uL   Hemoglobin 13.9 12.0 - 15.0 g/dL   HCT 78.6 76.7 - 20.9 %   MCV 76.0 (L) 80.0 - 100.0 fL   MCH 25.5 (L) 26.0 - 34.0 pg   MCHC 33.6 30.0 - 36.0 g/dL   RDW 47.0 96.2 - 83.6 %   Platelets 250 150 - 400 K/uL   nRBC 0.0 0.0 - 0.2 %   Neutrophils Relative % 65 %   Neutro Abs 4.2 1.7 - 7.7 K/uL   Lymphocytes Relative 25 %   Lymphs Abs 1.6 0.7 - 4.0 K/uL   Monocytes Relative 9 %   Monocytes Absolute 0.6 0.1 - 1.0 K/uL   Eosinophils Relative 1 %   Eosinophils  Absolute 0.1 0.0 - 0.5 K/uL   Basophils Relative 0 %   Basophils Absolute 0.0 0.0 - 0.1 K/uL   Immature Granulocytes 0 %   Abs Immature Granulocytes 0.01 0.00 - 0.07 K/uL    Comment: Performed at [facility], [address], [place], [place] [address]  Comprehensive metabolic panel     Status: Abnormal   Collection Time: 05/28/20 11:23 AM  Result Value Ref Range   Sodium 136 135 - 145 mmol/L   Potassium 3.6 3.5 - 5.1 mmol/L   Chloride 99 98 - 111 mmol/L   CO2 25 22 - 32 mmol/L   Glucose, Bld 90 70 - 99 mg/dL    Comment: Glucose reference range applies only to samples taken after fasting for at least 8 hours.   BUN 6 6 - 20 mg/dL   Creatinine, Ser 6.54 0.44 - 1.00 mg/dL   Calcium 9.5 8.9 - 65.0 mg/dL   Total Protein 7.2 6.5 - 8.1 g/dL   Albumin 3.8 3.5 - 5.0 g/dL   AST 17 15 - 41 U/L   ALT 22 0 - 44 U/L   Alkaline Phosphatase 70 38 - 126 U/L   Total Bilirubin 1.3 (H) 0.3 - 1.2 mg/dL   GFR, Estimated >35 >46 mL/min    Comment: (NOTE) Calculated using the CKD-EPI Creatinine Equation (2021)    Anion gap 12 5 - 15    Comment: Performed at [facility], [address], [place], [place] [address]  hCG, quantitative, pregnancy     Status: Abnormal   Collection Time: 05/28/20 11:23 AM  Result Value Ref Range   hCG, Beta Chain, Quant, S 262,435 (H) <5 mIU/mL    Comment:          GEST. AGE      CONC.  (mIU/mL)   <=1 WEEK        5 - 50     2 WEEKS       50 - 500     3 WEEKS       100 -  10,000     4 WEEKS     1,000 - 30,000     5 WEEKS     3,500 - 115,000   6-8 WEEKS     12,000 - 270,000    12 WEEKS     15,000 - 220,000        FEMALE AND NON-PREGNANT FEMALE:     LESS THAN 5 mIU/mL RESULTS CONFIRMED BY MANUAL DILUTION Performed at [facility], [address], [place], [place] [address]   HIV Antibody (routine testing w rflx)     Status: None   Collection Time: 05/28/20 11:23 AM  Result Value Ref Range   HIV Screen 4th Generation wRfx Non Reactive Non Reactive     Comment: Performed at [facility], [address] [place], [place] [address]  Urinalysis, Routine w reflex microscopic Urine, Random     Status: Abnormal   Collection Time: 05/28/20 11:27 AM  Result Value Ref Range   Color, Urine AMBER (A) YELLOW    Comment: BIOCHEMICALS MAY BE AFFECTED BY COLOR   APPearance CLOUDY (A) CLEAR   Specific Gravity, Urine 1.029 1.005 - 1.030   pH 7.0 5.0 - 8.0   Glucose, UA NEGATIVE NEGATIVE mg/dL   Hgb urine dipstick NEGATIVE NEGATIVE   Bilirubin Urine NEGATIVE NEGATIVE   Ketones, ur 80 (A) NEGATIVE mg/dL   Protein, ur 30 (A) NEGATIVE mg/dL   Nitrite NEGATIVE NEGATIVE   Leukocytes,Ua MODERATE (A) NEGATIVE   RBC / HPF 0-5 0 - 5 RBC/hpf   WBC, UA 21-50 0 - 5 WBC/hpf   Bacteria, UA RARE (A) NONE SEEN   Squamous Epithelial / LPF 21-50 0 - 5   Mucus PRESENT     Comment: Performed at [facility], [address], [place], [place] [address]  Wet prep, genital     Status: None   Collection Time: 05/28/20 12:09 PM   Specimen: Vaginal  Result Value Ref Range   Yeast Wet Prep HPF POC NONE SEEN NONE SEEN   Trich, Wet Prep NONE SEEN NONE SEEN   Clue Cells Wet Prep HPF POC NONE SEEN NONE SEEN   WBC, Wet Prep HPF POC NONE SEEN NONE SEEN   Sperm NONE SEEN     Comment: Performed at [facility], [address] [place], [place] [address]   [place] Comp Less 14 Wks  Result Date: 05/28/2020 CLINICAL DATA:  Abdominal pain EXAM: OBSTETRIC <14 WK ULTRASOUND TECHNIQUE: Transabdominal ultrasound was performed for evaluation of the gestation as well as the maternal uterus and adnexal regions. COMPARISON:  None. FINDINGS: Intrauterine gestational sac: Single Yolk sac:  Not Visualized. Embryo:  Visualized. Cardiac Activity: Visualized. Heart Rate: 162 bpm CRL:   20.7 mm   8 w 4 d                  [place] EDC: 01/03/2021 Subchorionic hemorrhage:  None visualized. Maternal uterus/adnexae: Unremarkable.  No free fluid. IMPRESSION: Single live intrauterine  pregnancy. Electronically Signed   By: Guadlupe Spanish M.D.   On: 05/28/2020 13:04    Review of Systems  Constitutional: Negative for fever.  Gastrointestinal: Positive for abdominal pain, diarrhea and vomiting.  Genitourinary: Positive for flank pain and vaginal discharge. Negative for dysuria, frequency and vaginal bleeding.   Physical Exam   Blood pressure 118/72, pulse 99, temperature 98.3 F (36.8 C), temperature source Oral, resp. rate 16, height 5\' 1"  (1.549 m), weight 59.9 kg, SpO2 98 %,  unknown if currently breastfeeding.  Physical Exam Constitutional:      General: She is not in acute distress.    Appearance: Normal appearance. She is not ill-appearing, toxic-appearing or diaphoretic.  Abdominal:     Tenderness: There is generalized abdominal tenderness. There is no right CVA tenderness or left CVA tenderness.  Skin:    General: Skin is warm.  Neurological:     Mental Status: She is alert and oriented to person, place, and time.  Psychiatric:        Behavior: Behavior normal.    MAU Course  Procedures  None  MDM  CBC & CMP  UA and urine culture  Quant and US  She was given a liter of LR, Pepcid, Zofran, and tylenol.   Assessment and Plan   A:  1. Nausea and vomiting during pregnancy   2. Abdominal pain affecting pregnancy   3. Gas pain   4. [redacted] weeks gestation of pregnancy   5. Back pain in pregnancy      P:  Discharge home in stable condition Urine culture pending RX: Gas X, Zofran Return to MAU if symptoms worsen  Start prenatal care   Makeda Peeks, Harolyn RutherfordJennifer I, NP 05/30/2020 7:46 PM

## 2020-05-29 LAB — GC/CHLAMYDIA PROBE AMP (~~LOC~~) NOT AT ARMC
Chlamydia: NEGATIVE
Comment: NEGATIVE
Comment: NORMAL
Neisseria Gonorrhea: NEGATIVE

## 2020-05-31 ENCOUNTER — Other Ambulatory Visit: Payer: Self-pay | Admitting: Obstetrics and Gynecology

## 2020-05-31 LAB — CULTURE, OB URINE: Culture: 40000 — AB

## 2020-05-31 MED ORDER — CEFADROXIL 500 MG PO CAPS: 500.0000 mg | ORAL_CAPSULE | Freq: Two times a day (BID) | ORAL | 0 refills | Status: DC

## 2020-05-31 NOTE — Progress Notes (Signed)
+   urine culture, having some urgency and lower back pain.   Rx: Duricef x 7 weeks.  Patient called and notified.    Duane Lope, NP 05/31/2020 4:15 PM

## 2020-06-19 ENCOUNTER — Telehealth: Payer: Self-pay

## 2020-06-19 ENCOUNTER — Ambulatory Visit: Payer: Medicaid Other

## 2020-06-19 NOTE — Telephone Encounter (Signed)
Tried calling patient twice to do NOB intake. LMTCB at 9:05am. Called patient back at 9:25am and call was sent straight to voicemail. Pt has NOB appt next week with Dr. Debroah Loop.

## 2020-06-24 ENCOUNTER — Other Ambulatory Visit: Payer: Self-pay | Admitting: Obstetrics

## 2020-06-26 ENCOUNTER — Encounter: Payer: Self-pay | Admitting: Obstetrics & Gynecology

## 2020-06-26 ENCOUNTER — Other Ambulatory Visit: Payer: Self-pay

## 2020-06-26 ENCOUNTER — Encounter: Payer: Medicaid Other | Admitting: Obstetrics & Gynecology

## 2020-06-26 DIAGNOSIS — Z3490 Encounter for supervision of normal pregnancy, unspecified, unspecified trimester: Secondary | ICD-10-CM | POA: Insufficient documentation

## 2020-06-26 HISTORY — DX: Encounter for supervision of normal pregnancy, unspecified, unspecified trimester: Z34.90

## 2020-06-26 NOTE — Progress Notes (Signed)
NOB [redacted]w[redacted]d    CC:

## 2020-07-17 ENCOUNTER — Other Ambulatory Visit: Payer: Self-pay

## 2020-07-17 ENCOUNTER — Ambulatory Visit (INDEPENDENT_AMBULATORY_CARE_PROVIDER_SITE_OTHER): Payer: Medicaid Other | Admitting: Obstetrics and Gynecology

## 2020-07-17 ENCOUNTER — Other Ambulatory Visit (HOSPITAL_COMMUNITY)
Admission: RE | Admit: 2020-07-17 | Discharge: 2020-07-17 | Disposition: A | Discharge status: Home / Self Care | Payer: Medicaid Other | Source: Ambulatory Visit | Attending: Obstetrics & Gynecology | Admitting: Obstetrics & Gynecology

## 2020-07-17 ENCOUNTER — Encounter: Payer: Self-pay | Admitting: Obstetrics and Gynecology

## 2020-07-17 VITALS — BP 110/77 | HR 81 | Wt 136.0 lb

## 2020-07-17 DIAGNOSIS — Z3492 Encounter for supervision of normal pregnancy, unspecified, second trimester: Secondary | ICD-10-CM

## 2020-07-17 DIAGNOSIS — O09899 Supervision of other high risk pregnancies, unspecified trimester: Secondary | ICD-10-CM | POA: Diagnosis not present

## 2020-07-17 DIAGNOSIS — Z3482 Encounter for supervision of other normal pregnancy, second trimester: Secondary | ICD-10-CM | POA: Diagnosis not present

## 2020-07-17 DIAGNOSIS — O219 Vomiting of pregnancy, unspecified: Secondary | ICD-10-CM | POA: Diagnosis not present

## 2020-07-17 DIAGNOSIS — Z3490 Encounter for supervision of normal pregnancy, unspecified, unspecified trimester: Secondary | ICD-10-CM | POA: Insufficient documentation

## 2020-07-17 DIAGNOSIS — D563 Thalassemia minor: Secondary | ICD-10-CM

## 2020-07-17 DIAGNOSIS — Z3A15 15 weeks gestation of pregnancy: Secondary | ICD-10-CM

## 2020-07-17 MED ORDER — VITAFOL GUMMIES 3.33-0.333-34.8 MG PO CHEW: 1.0000 | CHEWABLE_TABLET | Freq: Every day | ORAL | 5 refills | Status: DC

## 2020-07-17 MED ORDER — ONDANSETRON 4 MG PO TBDP: 4.0000 mg | ORAL_TABLET | Freq: Four times a day (QID) | ORAL | 1 refills | Status: DC | PRN

## 2020-07-17 NOTE — Patient Instructions (Signed)

## 2020-07-17 NOTE — Progress Notes (Signed)
Pt presents today for NOB. Requests refill on Zofran. Pt is also having back pain.

## 2020-07-17 NOTE — Progress Notes (Signed)
Subjective:    Amanda Davies is a [id number] [redacted]w[redacted]d being seen today for her first obstetrical visit.  Her obstetrical history is significant for history of preterm labor . Patient does intend to breast feed. Pregnancy history fully reviewed.  Patient has moved to [place] but strongly desires to have her care with our office. Discussed with her, she plans on trying to deliver here as well.  Having some nausea and vomiting. Has tried OTC remedies without improvement.  Strongly desires tubal.   Patient reports no complaints.  Vitals:   07/17/20 1430  BP: 110/77  Pulse: 81  Weight: 136 lb (61.7 kg)    HISTORY: OB History  Gravida Para Term Preterm AB Living  6 4 2 2 1 4   SAB IAB Ectopic Multiple Live Births  1     0 4    # Outcome Date GA Lbr Len/2nd Weight Sex Delivery Anes PTL Lv  6 Current           5 Term 09/10/19 [redacted]w[redacted]d 06:13 / 00:13 5 lb 14.5 oz (2.679 kg) F Vag-Spont EPI  LIV  4 Preterm 07/09/18 [redacted]w[redacted]d / 00:16 4 lb 12.2 oz (2.16 kg) F Vag-Spont EPI  LIV  3 Term 04/23/17 [redacted]w[redacted]d 05:15 / 01:33 4 lb 14.8 oz (2.234 kg) F Vag-Spont EPI  LIV     Birth Comments: n/a  2 Preterm 12/30/15 [redacted]w[redacted]d 12:22 / 00:31 4 lb 11.1 oz (2.13 kg) F Vag-Spont EPI  LIV  1 SAB 2016           Past Medical History:  Diagnosis Date  . Acne   . Asthma    when younger  . Chlamydia   . Hx of pre-term labor    Past Surgical History:  Procedure Laterality Date  . NO PAST SURGERIES     Family History  Problem Relation Age of Onset  . Anxiety disorder Mother   . Asthma Sister   . Asthma Brother      Exam    Uterus:     Pelvic Exam:    Perineum: No Hemorrhoids   Vulva: normal   Vagina:  normal mucosa   Skin: normal coloration and turgor, no rashes    Neurologic: oriented, normal   Extremities: normal strength, tone, and muscle mass   HEENT PERRLA   Mouth/Teeth mucous membranes moist, pharynx normal without lesions   Neck supple   Cardiovascular: regular rate and rhythm   Respiratory:   appears well, vitals normal, no respiratory distress, acyanotic, normal RR, ear and throat exam is normal, neck free of mass or lymphadenopathy, chest clear, no wheezing, crepitations, rhonchi, normal symmetric air entry   Abdomen: soft, non-tender; bowel sounds normal; no masses,  no organomegaly   Urinary: urethral meatus normal      Assessment:    Pregnancy: 2017 Patient Active Problem List   Diagnosis Date Noted  . Supervision of normal pregnancy, antepartum 06/26/2020  . PROM (premature rupture of membranes) 09/10/2019  . Insufficient prenatal care in third trimester 07/21/2019  . GBS bacteriuria 04/25/2019  . Alpha thalassemia silent carrier 03/24/2019  . Pyelonephritis affecting pregnancy 03/05/2019  . Supervision of high risk pregnancy, antepartum 11/27/2016  . H/O preterm delivery, currently pregnant 01/01/2016        Plan:   Supervision of normal pregnancy, [redacted] weeks gestation History of Preterm Labor -Patient declines makena. Counseled on benefits, declines at this time.  -Zofran script sent for nausea/vomiting  -discussed tubal today with patient, will plan to have  her sign papers in future should she continue to receive [facility] with [place].   Initial labs drawn. Prenatal vitamins. Problem list reviewed and updated. Genetic Screening discussed First Screen: requested.  Ultrasound discussed; fetal survey: requested.  Follow up in 4 weeks.   Gita Kudo 07/17/2020

## 2020-07-18 LAB — CYTOLOGY - PAP: Diagnosis: NEGATIVE

## 2020-07-18 LAB — CERVICOVAGINAL ANCILLARY ONLY
Chlamydia: NEGATIVE
Comment: NEGATIVE
Comment: NEGATIVE
Comment: NORMAL
Neisseria Gonorrhea: NEGATIVE
Trichomonas: NEGATIVE

## 2020-07-19 ENCOUNTER — Other Ambulatory Visit: Payer: Self-pay | Admitting: Obstetrics and Gynecology

## 2020-07-19 LAB — URINE CULTURE

## 2020-07-19 LAB — AFP, SERUM, OPEN SPINA BIFIDA
AFP MoM: 0.63
AFP Value: 23.5 ng/mL
Gest. Age on Collection Date: 15.5 weeks
Maternal Age At EDD: 22.5 yr
OSBR Risk 1 IN: 10000
Test Results:: NEGATIVE
Weight: 136 [lb_av]

## 2020-07-19 LAB — CBC/D/PLT+RPR+RH+ABO+RUB AB...
Antibody Screen: NEGATIVE
Basophils Absolute: 0 10*3/uL (ref 0.0–0.2)
Basos: 0 %
EOS (ABSOLUTE): 0.2 10*3/uL (ref 0.0–0.4)
Eos: 2 %
HCV Ab: <0.1 s/co ratio (ref 0.0–0.9)
HIV Screen 4th Generation wRfx: NONREACTIVE
Hematocrit: 40.8 % (ref 34.0–46.6)
Hemoglobin: 13 g/dL (ref 11.1–15.9)
Hepatitis B Surface Ag: NEGATIVE
Immature Grans (Abs): 0.1 10*3/uL (ref 0.0–0.1)
Immature Granulocytes: 1 %
Lymphocytes Absolute: 2.3 10*3/uL (ref 0.7–3.1)
Lymphs: 25 %
MCH: 24.9 pg — ABNORMAL LOW (ref 26.6–33.0)
MCHC: 31.9 g/dL (ref 31.5–35.7)
MCV: 78 fL — ABNORMAL LOW (ref 79–97)
Monocytes Absolute: 0.7 10*3/uL (ref 0.1–0.9)
Monocytes: 7 %
Neutrophils Absolute: 6 10*3/uL (ref 1.4–7.0)
Neutrophils: 65 %
Platelets: 236 10*3/uL (ref 150–450)
RBC: 5.23 x10E6/uL (ref 3.77–5.28)
RDW: 14.3 % (ref 11.7–15.4)
RPR Ser Ql: NONREACTIVE
Rh Factor: POSITIVE
Rubella Antibodies, IGG: 16.1 index (ref >0.99)
WBC: 9.2 10*3/uL (ref 3.4–10.8)

## 2020-07-19 LAB — HCV INTERPRETATION

## 2020-07-23 ENCOUNTER — Encounter: Payer: Self-pay | Admitting: Obstetrics and Gynecology

## 2020-07-26 ENCOUNTER — Encounter: Payer: Self-pay | Admitting: Obstetrics and Gynecology

## 2020-08-09 ENCOUNTER — Encounter: Payer: Self-pay | Admitting: *Deleted

## 2020-08-09 ENCOUNTER — Ambulatory Visit: Payer: Medicaid Other | Attending: Maternal & Fetal Medicine

## 2020-08-09 ENCOUNTER — Ambulatory Visit: Payer: Medicaid Other | Admitting: *Deleted

## 2020-08-09 ENCOUNTER — Other Ambulatory Visit: Payer: Self-pay | Admitting: Obstetrics and Gynecology

## 2020-08-09 ENCOUNTER — Other Ambulatory Visit: Payer: Self-pay

## 2020-08-09 DIAGNOSIS — Z3490 Encounter for supervision of normal pregnancy, unspecified, unspecified trimester: Secondary | ICD-10-CM

## 2020-08-10 ENCOUNTER — Other Ambulatory Visit: Payer: Self-pay | Admitting: *Deleted

## 2020-08-10 DIAGNOSIS — O09219 Supervision of pregnancy with history of pre-term labor, unspecified trimester: Secondary | ICD-10-CM

## 2020-08-15 ENCOUNTER — Encounter: Payer: Medicaid Other | Admitting: Women's Health

## 2020-08-24 ENCOUNTER — Ambulatory Visit: Payer: Medicaid Other

## 2020-08-24 ENCOUNTER — Other Ambulatory Visit: Payer: Medicaid Other

## 2020-09-06 ENCOUNTER — Ambulatory Visit: Payer: Medicaid Other

## 2020-09-30 ENCOUNTER — Other Ambulatory Visit: Payer: Self-pay | Admitting: Medical

## 2020-09-30 DIAGNOSIS — O219 Vomiting of pregnancy, unspecified: Secondary | ICD-10-CM

## 2020-10-10 ENCOUNTER — Other Ambulatory Visit: Payer: Self-pay

## 2020-10-10 ENCOUNTER — Encounter: Payer: Self-pay | Admitting: Obstetrics

## 2020-10-10 ENCOUNTER — Ambulatory Visit (INDEPENDENT_AMBULATORY_CARE_PROVIDER_SITE_OTHER): Payer: Medicaid Other | Admitting: Obstetrics

## 2020-10-10 VITALS — BP 104/67 | HR 96 | Wt 140.0 lb

## 2020-10-10 DIAGNOSIS — O219 Vomiting of pregnancy, unspecified: Secondary | ICD-10-CM | POA: Diagnosis not present

## 2020-10-10 DIAGNOSIS — O09899 Supervision of other high risk pregnancies, unspecified trimester: Secondary | ICD-10-CM | POA: Diagnosis not present

## 2020-10-10 DIAGNOSIS — Z91199 Patient's noncompliance with other medical treatment and regimen due to unspecified reason: Secondary | ICD-10-CM

## 2020-10-10 DIAGNOSIS — Z5329 Procedure and treatment not carried out because of patient's decision for other reasons: Secondary | ICD-10-CM | POA: Diagnosis not present

## 2020-10-10 DIAGNOSIS — Z131 Encounter for screening for diabetes mellitus: Secondary | ICD-10-CM

## 2020-10-10 DIAGNOSIS — O0990 Supervision of high risk pregnancy, unspecified, unspecified trimester: Secondary | ICD-10-CM | POA: Diagnosis not present

## 2020-10-10 MED ORDER — VITAFOL GUMMIES 3.33-0.333-34.8 MG PO CHEW: 3.0000 | CHEWABLE_TABLET | Freq: Every day | ORAL | 11 refills | Status: DC

## 2020-10-10 MED ORDER — ONDANSETRON 4 MG PO TBDP
4.0000 mg | ORAL_TABLET | Freq: Four times a day (QID) | ORAL | 5 refills | Status: DC | PRN
Start: 2020-10-10 — End: 2020-11-05

## 2020-10-10 NOTE — Progress Notes (Signed)
ROB c/o NV and she does not want to do the 2 hr. GTT.

## 2020-10-10 NOTE — Progress Notes (Signed)
Subjective:  Amanda Davies is a 20 y.o. [id number] at [redacted]w[redacted]d being seen today for ongoing prenatal care.  She is currently monitored for the following issues for this high-risk pregnancy and has H/O preterm delivery, currently pregnant; Supervision of high risk pregnancy, antepartum; Pyelonephritis affecting pregnancy; Alpha thalassemia silent carrier; GBS bacteriuria; Insufficient prenatal care in third trimester; PROM (premature rupture of membranes); and Supervision of normal pregnancy, antepartum on their problem list.  Patient reports nausea and vomiting.  Contractions: Not present. Vag. Bleeding: None.  Movement: Present. Denies leaking of fluid.   The following portions of the patient's history were reviewed and updated as appropriate: allergies, current medications, past family history, past medical history, past social history, past surgical history and problem list. Problem list updated.  Objective:   Vitals:   10/10/20 1528  BP: 104/67  Pulse: 96  Weight: 140 lb (63.5 kg)    Fetal Status: Fetal Heart Rate (bpm): 154   Movement: Present     General:  Alert, oriented and cooperative. Patient is in no acute distress.  Skin: Skin is warm and dry. No rash noted.   Cardiovascular: Normal heart rate noted  Respiratory: Normal respiratory effort, no problems with respiration noted  Abdomen: Soft, gravid, appropriate for gestational age. Pain/Pressure: Present     Pelvic:  Cervical exam deferred        Extremities: Normal range of motion.  Edema: None  Mental Status: Normal mood and affect. Normal behavior. Normal judgment and thought content.   Urinalysis:      Assessment and Plan:  Pregnancy: [id number] at [redacted]w[redacted]d  1. Supervision of high risk pregnancy, antepartum Rx: - Prenatal Vit-Fe Phos-FA-Omega (VITAFOL GUMMIES) 3.33-0.333-34.8 MG CHEW; Chew 3 tablets by mouth daily before breakfast.  Dispense: 90 tablet; Refill: 11  2. H/O preterm delivery, currently pregnant - patient refuses  Makena  3. Nausea and vomiting during pregnancy - patient refuses 2 hour GTT because of the nausea and vomiting.  Will draw a HgbA1c today Rx: - ondansetron (ZOFRAN ODT) 4 MG disintegrating tablet; Take 1 tablet (4 mg total) by mouth every 6 (six) hours as needed for nausea.  Dispense: 30 tablet; Refill: 5  4. Screening for diabetes mellitus Rx: - Hemoglobin A1c  5. Noncompliance by declining intervention of support of pregnancy - has had limited prenatal care   Preterm labor symptoms and general obstetric precautions including but not limited to vaginal bleeding, contractions, leaking of fluid and fetal movement were reviewed in detail with the patient. Please refer to After Visit Summary for other counseling recommendations.   Return in about 2 weeks (around 10/24/2020) for ROB.   Brock Bad, MD  10/10/20

## 2020-10-11 LAB — HEMOGLOBIN A1C
Est. average glucose Bld gHb Est-mCnc: 100 mg/dL
Hgb A1c MFr Bld: 5.1 % (ref 4.8–5.6)

## 2020-10-24 ENCOUNTER — Encounter: Payer: Medicaid Other | Admitting: Obstetrics and Gynecology

## 2020-10-29 ENCOUNTER — Encounter: Payer: Medicaid Other | Admitting: Obstetrics

## 2020-11-05 ENCOUNTER — Other Ambulatory Visit: Payer: Self-pay | Admitting: Obstetrics & Gynecology

## 2020-11-05 DIAGNOSIS — O219 Vomiting of pregnancy, unspecified: Secondary | ICD-10-CM

## 2020-11-05 MED ORDER — ONDANSETRON 4 MG PO TBDP: 4.0000 mg | ORAL_TABLET | Freq: Four times a day (QID) | ORAL | 5 refills | Status: DC | PRN

## 2020-11-13 ENCOUNTER — Ambulatory Visit (INDEPENDENT_AMBULATORY_CARE_PROVIDER_SITE_OTHER): Payer: Medicaid Other | Admitting: Obstetrics

## 2020-11-13 ENCOUNTER — Other Ambulatory Visit (HOSPITAL_COMMUNITY)
Admission: RE | Admit: 2020-11-13 | Discharge: 2020-11-13 | Disposition: A | Discharge status: Home / Self Care | Payer: Medicaid Other | Source: Ambulatory Visit | Attending: Obstetrics and Gynecology | Admitting: Obstetrics and Gynecology

## 2020-11-13 ENCOUNTER — Encounter: Payer: Self-pay | Admitting: Obstetrics

## 2020-11-13 ENCOUNTER — Other Ambulatory Visit: Payer: Self-pay

## 2020-11-13 VITALS — BP 101/63 | HR 98 | Wt 139.0 lb

## 2020-11-13 DIAGNOSIS — B373 Candidiasis of vulva and vagina: Secondary | ICD-10-CM | POA: Diagnosis not present

## 2020-11-13 DIAGNOSIS — O219 Vomiting of pregnancy, unspecified: Secondary | ICD-10-CM

## 2020-11-13 DIAGNOSIS — B3731 Acute candidiasis of vulva and vagina: Secondary | ICD-10-CM

## 2020-11-13 DIAGNOSIS — O09899 Supervision of other high risk pregnancies, unspecified trimester: Secondary | ICD-10-CM

## 2020-11-13 DIAGNOSIS — N898 Other specified noninflammatory disorders of vagina: Secondary | ICD-10-CM | POA: Diagnosis not present

## 2020-11-13 DIAGNOSIS — O0990 Supervision of high risk pregnancy, unspecified, unspecified trimester: Secondary | ICD-10-CM

## 2020-11-13 MED ORDER — TERCONAZOLE 0.8 % VA CREA
1.0000 | TOPICAL_CREAM | Freq: Every day | VAGINAL | 0 refills | Status: DC
Start: 2020-11-13 — End: 2022-04-17

## 2020-11-13 NOTE — Progress Notes (Signed)
ROB [redacted]w[redacted]d  CC: Vaginal Discharge and Itching. Pt wants swab.  Pt was seen in [place] x 3 wks ago for contractions received injection for baby's lungs was advise to return the next day however pt states she was not able to.   Pt wants refill on Zofran.

## 2020-11-13 NOTE — Progress Notes (Signed)
Subjective:  Amanda Davies is a 20 y.o. [id number] at [redacted]w[redacted]d being seen today for ongoing prenatal care.  She is currently monitored for the following issues for this high-risk pregnancy and has H/O preterm delivery, currently pregnant; Supervision of high risk pregnancy, antepartum; Pyelonephritis affecting pregnancy; Alpha thalassemia silent carrier; GBS bacteriuria; Insufficient prenatal care in third trimester; PROM (premature rupture of membranes); and Supervision of normal pregnancy, antepartum on their problem list.  Patient reports  being seen in [place] for dehydration and was given a "shot" for contractions, and was supposed to return the next day for a second shot but she had to leave to return to [place] and didn't get it. .  Contractions: Irritability. Vag. Bleeding: None.  Movement: Present. Denies leaking of fluid.   The following portions of the patient's history were reviewed and updated as appropriate: allergies, current medications, past family history, past medical history, past social history, past surgical history and problem list. Problem list updated.  Objective:   Vitals:   11/13/20 1432  BP: 101/63  Pulse: 98  Weight: 139 lb (63 kg)    Fetal Status:     Movement: Present     General:  Alert, oriented and cooperative. Patient is in no acute distress.  Skin: Skin is warm and dry. No rash noted.   Cardiovascular: Normal heart rate noted  Respiratory: Normal respiratory effort, no problems with respiration noted  Abdomen: Soft, gravid, appropriate for gestational age. Pain/Pressure: Present     Pelvic:  Cervical exam performed     soft / finger-tip / Long / -3 / Vtx   Extremities: Normal range of motion.  Edema: None  Mental Status: Normal mood and affect. Normal behavior. Normal judgment and thought content.   Urinalysis:      Assessment and Plan:  Pregnancy: [id number] at [redacted]w[redacted]d  1. Supervision of high risk pregnancy, antepartum  2. H/O preterm delivery, currently  pregnant  3. Vaginal discharge Rx: - Cervicovaginal ancillary only( [place])  4. Candida vaginitis Rx: - terconazole (TERAZOL 3) 0.8 % vaginal cream; Place 1 applicator vaginally at bedtime.  Dispense: 20 g; Refill: 0  5. Nausea and vomiting during pregnancy - taking Zofran   Preterm labor symptoms and general obstetric precautions including but not limited to vaginal bleeding, contractions, leaking of fluid and fetal movement were reviewed in detail with the patient. Please refer to After Visit Summary for other counseling recommendations.   Return in about 1 week (around 11/20/2020) for [facility].   Brock Bad, MD  11/13/2020

## 2020-11-14 LAB — CERVICOVAGINAL ANCILLARY ONLY
Bacterial Vaginitis (gardnerella): POSITIVE — AB
Candida Glabrata: NEGATIVE
Candida Vaginitis: POSITIVE — AB
Chlamydia: NEGATIVE
Comment: NEGATIVE
Comment: NEGATIVE
Comment: NEGATIVE
Comment: NEGATIVE
Comment: NEGATIVE
Comment: NORMAL
Neisseria Gonorrhea: NEGATIVE
Trichomonas: NEGATIVE

## 2020-11-21 ENCOUNTER — Encounter: Payer: Medicaid Other | Admitting: Family Medicine

## 2020-11-26 ENCOUNTER — Other Ambulatory Visit: Payer: Self-pay | Admitting: Obstetrics

## 2020-11-26 ENCOUNTER — Telehealth: Payer: Self-pay

## 2020-11-26 DIAGNOSIS — N760 Acute vaginitis: Secondary | ICD-10-CM

## 2020-11-26 MED ORDER — METRONIDAZOLE 500 MG PO TABS
500.0000 mg | ORAL_TABLET | Freq: Two times a day (BID) | ORAL | 2 refills | Status: DC
Start: 2020-11-26 — End: 2020-12-15

## 2020-11-26 NOTE — Progress Notes (Signed)
flagy

## 2020-11-26 NOTE — Telephone Encounter (Signed)
Attempted to reach pt by phone to make aware of results pt not ava  Not able to lvm.  Message sent to front desk pt needs missed appt R/S

## 2020-12-04 ENCOUNTER — Telehealth: Payer: Self-pay

## 2020-12-04 NOTE — Telephone Encounter (Signed)
Patient has been advised to take monistat 7 for her yeast infection.

## 2020-12-05 ENCOUNTER — Encounter: Payer: Medicaid Other | Admitting: Obstetrics

## 2020-12-13 ENCOUNTER — Ambulatory Visit: Payer: Medicaid Other

## 2020-12-13 ENCOUNTER — Encounter (HOSPITAL_COMMUNITY): Payer: Self-pay | Admitting: Obstetrics & Gynecology

## 2020-12-13 ENCOUNTER — Other Ambulatory Visit: Payer: Self-pay

## 2020-12-13 ENCOUNTER — Inpatient Hospital Stay (HOSPITAL_COMMUNITY)
Admission: AD | Admit: 2020-12-13 | Discharge: 2020-12-15 | DRG: 807 | Disposition: A | Discharge status: Home / Self Care | Payer: Medicaid Other | Attending: Obstetrics & Gynecology | Admitting: Obstetrics & Gynecology

## 2020-12-13 DIAGNOSIS — O4292 Full-term premature rupture of membranes, unspecified as to length of time between rupture and onset of labor: Secondary | ICD-10-CM | POA: Diagnosis not present

## 2020-12-13 DIAGNOSIS — Z3490 Encounter for supervision of normal pregnancy, unspecified, unspecified trimester: Secondary | ICD-10-CM

## 2020-12-13 DIAGNOSIS — Z3A37 37 weeks gestation of pregnancy: Secondary | ICD-10-CM | POA: Diagnosis not present

## 2020-12-13 DIAGNOSIS — O26893 Other specified pregnancy related conditions, third trimester: Secondary | ICD-10-CM | POA: Diagnosis not present

## 2020-12-13 DIAGNOSIS — R8271 Bacteriuria: Secondary | ICD-10-CM | POA: Diagnosis present

## 2020-12-13 DIAGNOSIS — O4290 Premature rupture of membranes, unspecified as to length of time between rupture and onset of labor, unspecified weeks of gestation: Secondary | ICD-10-CM | POA: Diagnosis present

## 2020-12-13 DIAGNOSIS — O9902 Anemia complicating childbirth: Secondary | ICD-10-CM | POA: Diagnosis not present

## 2020-12-13 DIAGNOSIS — D563 Thalassemia minor: Secondary | ICD-10-CM | POA: Diagnosis not present

## 2020-12-13 DIAGNOSIS — O99824 Streptococcus B carrier state complicating childbirth: Secondary | ICD-10-CM | POA: Diagnosis not present

## 2020-12-13 DIAGNOSIS — Z20822 Contact with and (suspected) exposure to covid-19: Secondary | ICD-10-CM | POA: Diagnosis present

## 2020-12-13 DIAGNOSIS — O0933 Supervision of pregnancy with insufficient antenatal care, third trimester: Secondary | ICD-10-CM

## 2020-12-13 LAB — CBC WITH DIFFERENTIAL/PLATELET
Abs Immature Granulocytes: 0.06 10*3/uL (ref 0.00–0.07)
Basophils Absolute: 0 10*3/uL (ref 0.0–0.1)
Basophils Relative: 0 %
Eosinophils Absolute: 0.1 10*3/uL (ref 0.0–0.5)
Eosinophils Relative: 1 %
HCT: 38.8 % (ref 36.0–46.0)
Hemoglobin: 12.1 g/dL (ref 12.0–15.0)
Immature Granulocytes: 1 %
Lymphocytes Relative: 34 %
Lymphs Abs: 2.6 10*3/uL (ref 0.7–4.0)
MCH: 23.5 pg — ABNORMAL LOW (ref 26.0–34.0)
MCHC: 31.2 g/dL (ref 30.0–36.0)
MCV: 75.5 fL — ABNORMAL LOW (ref 80.0–100.0)
Monocytes Absolute: 0.7 10*3/uL (ref 0.1–1.0)
Monocytes Relative: 9 %
Neutro Abs: 4.1 10*3/uL (ref 1.7–7.7)
Neutrophils Relative %: 55 %
Platelets: 219 10*3/uL (ref 150–400)
RBC: 5.14 MIL/uL — ABNORMAL HIGH (ref 3.87–5.11)
RDW: 14.6 % (ref 11.5–15.5)
WBC: 7.5 10*3/uL (ref 4.0–10.5)
nRBC: 0 % (ref 0.0–0.2)

## 2020-12-13 LAB — BASIC METABOLIC PANEL
Anion gap: 9 (ref 5–15)
BUN: <5 mg/dL — ABNORMAL LOW (ref 6–20)
CO2: 22 mmol/L (ref 22–32)
Calcium: 8.9 mg/dL (ref 8.9–10.3)
Chloride: 102 mmol/L (ref 98–111)
Creatinine, Ser: 0.84 mg/dL (ref 0.44–1.00)
GFR, Estimated: >60 mL/min (ref >60)
Glucose, Bld: 101 mg/dL — ABNORMAL HIGH (ref 70–99)
Potassium: 3.3 mmol/L — ABNORMAL LOW (ref 3.5–5.1)
Sodium: 133 mmol/L — ABNORMAL LOW (ref 135–145)

## 2020-12-13 LAB — GROUP B STREP BY PCR: Group B strep by PCR: NEGATIVE

## 2020-12-13 LAB — RPR: RPR Ser Ql: NONREACTIVE

## 2020-12-13 LAB — TYPE AND SCREEN
ABO/RH(D): O POS
Antibody Screen: NEGATIVE

## 2020-12-13 LAB — RESP PANEL BY RT-PCR (FLU A&B, COVID) ARPGX2
Influenza A by PCR: NEGATIVE
Influenza B by PCR: NEGATIVE
SARS Coronavirus 2 by RT PCR: NEGATIVE

## 2020-12-13 MED ORDER — OXYTOCIN 10 UNIT/ML IJ SOLN: 10.0000 [IU] | Freq: Once | INTRAMUSCULAR | Status: DC

## 2020-12-13 MED ORDER — FENTANYL CITRATE (PF) 100 MCG/2ML IJ SOLN
100.0000 ug | Freq: Once | INTRAMUSCULAR | Status: DC
  Administered 2020-12-13: 100 ug via INTRAVENOUS
  Filled 2020-12-13: qty 2

## 2020-12-13 MED ORDER — COCONUT OIL OIL: 1.0000 "application " | TOPICAL_OIL | Status: DC | PRN

## 2020-12-13 MED ORDER — OXYCODONE HCL 5 MG PO TABS: 5.0000 mg | ORAL_TABLET | ORAL | Status: DC | PRN

## 2020-12-13 MED ORDER — DIBUCAINE (PERIANAL) 1 % EX OINT
1.0000 "application " | TOPICAL_OINTMENT | CUTANEOUS | Status: DC | PRN
  Filled 2020-12-13: qty 28

## 2020-12-13 MED ORDER — ONDANSETRON HCL 4 MG/2ML IJ SOLN: 4.0000 mg | INTRAMUSCULAR | Status: DC | PRN

## 2020-12-13 MED ORDER — OXYTOCIN-SODIUM CHLORIDE 30-0.9 UT/500ML-% IV SOLN
INTRAVENOUS | Status: AC
  Filled 2020-12-13: qty 500

## 2020-12-13 MED ORDER — WITCH HAZEL-GLYCERIN EX PADS: 1.0000 "application " | MEDICATED_PAD | CUTANEOUS | Status: DC | PRN

## 2020-12-13 MED ORDER — LACTATED RINGERS IV SOLN: INTRAVENOUS | Status: DC

## 2020-12-13 MED ORDER — ERYTHROMYCIN 5 MG/GM OP OINT
TOPICAL_OINTMENT | OPHTHALMIC | Status: AC
  Administered 2020-12-13: 1
  Filled 2020-12-13: qty 1

## 2020-12-13 MED ORDER — OXYTOCIN 10 UNIT/ML IJ SOLN
INTRAMUSCULAR | Status: AC
  Administered 2020-12-13: 10 [IU] via INTRAMUSCULAR
  Filled 2020-12-13: qty 1

## 2020-12-13 MED ORDER — IBUPROFEN 600 MG PO TABS
600.0000 mg | ORAL_TABLET | Freq: Four times a day (QID) | ORAL | Status: DC
Start: 2020-12-13 — End: 2020-12-16
  Administered 2020-12-13 – 2020-12-15 (×9): 600 mg via ORAL
  Filled 2020-12-13 (×11): qty 1

## 2020-12-13 MED ORDER — SENNOSIDES-DOCUSATE SODIUM 8.6-50 MG PO TABS
2.0000 | ORAL_TABLET | Freq: Every day | ORAL | Status: DC
Start: 2020-12-14 — End: 2020-12-16
  Administered 2020-12-14 – 2020-12-15 (×3): 2 via ORAL
  Filled 2020-12-13 (×2): qty 2

## 2020-12-13 MED ORDER — MEDROXYPROGESTERONE ACETATE 150 MG/ML IM SUSP
150.0000 mg | Freq: Once | INTRAMUSCULAR | Status: AC
  Administered 2020-12-15: 150 mg via INTRAMUSCULAR
  Filled 2020-12-13: qty 1

## 2020-12-13 MED ORDER — DIPHENHYDRAMINE HCL 25 MG PO CAPS
25.0000 mg | ORAL_CAPSULE | Freq: Four times a day (QID) | ORAL | Status: DC | PRN
  Administered 2020-12-14: 25 mg via ORAL
  Filled 2020-12-13: qty 1

## 2020-12-13 MED ORDER — OXYCODONE HCL 5 MG PO TABS
10.0000 mg | ORAL_TABLET | ORAL | Status: DC | PRN
  Administered 2020-12-13 – 2020-12-15 (×10): 10 mg via ORAL
  Filled 2020-12-13 (×10): qty 2

## 2020-12-13 MED ORDER — BENZOCAINE-MENTHOL 20-0.5 % EX AERO
1.0000 | INHALATION_SPRAY | CUTANEOUS | Status: DC | PRN
Start: 2020-12-13 — End: 2020-12-16

## 2020-12-13 MED ORDER — PRENATAL MULTIVITAMIN CH
1.0000 | ORAL_TABLET | Freq: Every day | ORAL | Status: DC
Start: 2020-12-13 — End: 2020-12-16
  Administered 2020-12-14 – 2020-12-15 (×2): 1 via ORAL
  Filled 2020-12-13 (×4): qty 1

## 2020-12-13 MED ORDER — ACETAMINOPHEN 325 MG PO TABS
650.0000 mg | ORAL_TABLET | ORAL | Status: DC | PRN
  Administered 2020-12-13 – 2020-12-15 (×6): 650 mg via ORAL
  Filled 2020-12-13 (×5): qty 2

## 2020-12-13 MED ORDER — SIMETHICONE 80 MG PO CHEW: 80.0000 mg | CHEWABLE_TABLET | ORAL | Status: DC | PRN

## 2020-12-13 MED ORDER — ONDANSETRON HCL 4 MG PO TABS
4.0000 mg | ORAL_TABLET | ORAL | Status: DC | PRN
  Administered 2020-12-14 – 2020-12-15 (×2): 4 mg via ORAL
  Filled 2020-12-13 (×2): qty 1

## 2020-12-13 NOTE — H&P (Signed)
Amanda Davies is a 20 y.o. [phone] female at [redacted]w[redacted]d presenting for spontaneous onset of labor with precipitous delivery in MAU. OB History     Gravida  6   Para  4   Term  2   Preterm  2   AB  1   Living  4      SAB  1   IAB      Ectopic      Multiple  0   Live Births  4          Past Medical History:  Diagnosis Date   Acne    Asthma    when younger   Chlamydia    Hx of pre-term labor    Past Surgical History:  Procedure Laterality Date   NO PAST SURGERIES     Family History: family history includes Anxiety disorder in her mother; Asthma in her brother and sister. Social History:  reports that she has never smoked. She has never used smokeless tobacco. She reports that she does not drink alcohol and does not use drugs.    Maternal Diabetes: No - refused GTT, early A1C normal Genetic Screening: Normal Maternal Ultrasounds/Referrals: Other: Fetal Ultrasounds or other Referrals: possible FGR, U/S scheduled for today to confirmNone Maternal Substance Abuse:  No Significant Maternal Medications:  None Significant Maternal Lab Results:  Other:  GBS unknown Other Comments:  None  Review of Systems Maternal Medical History:  Reason for admission: Rupture of membranes and contractions.   Contractions: Onset was 3-5 hours ago.   Frequency: regular.   Duration is approximately 1 minute.   Perceived severity is strong.   Fetal activity: Perceived fetal activity is normal.   Last perceived fetal movement was within the past hour.   Prenatal complications: IUGR (possible) and preterm labor (history, also seen for ctx at 30ish weeks in [place]).   Prenatal Complications - Diabetes: none.    Blood pressure 108/77, pulse 73, temperature 97.8 F (36.6 C), temperature source Oral, resp. rate 16, SpO2 100 %, unknown if currently breastfeeding. Maternal Exam:  Uterine Assessment: Contraction strength is firm.  Contraction duration is 1 minute. Contraction frequency is  regular.  Abdomen: Patient reports no abdominal tenderness. Fetal presentation: vertex Introitus: Normal vulva. Normal vagina.  Ferning test: not done.  Amniotic fluid character: clear. Pelvis: adequate for delivery.   Cervix: Cervix evaluated by digital exam.     Fetal Exam Fetal Monitor Review: Mode: ultrasound.   Baseline rate: 110.  Variability: moderate (6-25 bpm).   Pattern: variable decelerations and accelerations present.   Fetal State Assessment: Category I - tracings are normal.  Physical Exam Vitals and nursing note reviewed.  Constitutional:      General: She is in acute distress (actively pushing).     Appearance: Normal appearance. She is not ill-appearing.  HENT:     Head: Normocephalic and atraumatic.  Eyes:     Pupils: Pupils are equal, round, and reactive to light.  Cardiovascular:     Rate and Rhythm: Normal rate.     Pulses: Normal pulses.  Pulmonary:     Effort: Pulmonary effort is normal.  Abdominal:     Palpations: Abdomen is soft.     Tenderness: There is no abdominal tenderness.  Genitourinary:    General: Normal vulva.  Musculoskeletal:        General: Normal range of motion.     Cervical back: Normal range of motion.  Skin:    General: Skin is warm and  dry.     Capillary Refill: Capillary refill takes less than 2 seconds.  Neurological:     Mental Status: She is alert and oriented to person, place, and time.  Psychiatric:        Mood and Affect: Mood normal.        Behavior: Behavior normal.        Thought Content: Thought content normal.        Judgment: Judgment normal.    Prenatal labs: ABO, Rh: --/--/PENDING (08/11 1100) Antibody: PENDING (08/11 1100) Rubella: 16.10 (03/15 1523) RPR: Non Reactive (03/15 1523)  HBsAg: Negative (03/15 1523)  HIV: Non Reactive (03/15 1523)  GBS:  Unknown, pt did not show up for appts after 11/13/20  Assessment/Plan: [id number] at [redacted]w[redacted]d s/p SVD Admit to postpartum GBS status unknown (positive in  previous pregnancy) Consult to social work r/t limited PNC (only 4 visits total)  Bernerd Limbo 12/13/2020, 11:42 AM

## 2020-12-13 NOTE — Discharge Summary (Addendum)
Postpartum Discharge Summary     Patient Name: Amanda Davies DOB: 2000/05/21 MRN: [id number]  Date of admission: 12/13/2020 Delivery date:12/13/2020  Delivering provider: Gaylan Gerold R  Date of discharge: 12/15/2020  Admitting diagnosis: Encounter for vaginal delivery [O80] Intrauterine pregnancy: [redacted]w[redacted]d    Secondary diagnosis:  Active Problems:   Alpha thalassemia silent carrier   GBS bacteriuria   Insufficient prenatal care in third trimester   PROM (premature rupture of membranes)   Supervision of normal pregnancy, antepartum   Encounter for vaginal delivery Anemia   Additional problems: None    Discharge diagnosis: Term Pregnancy Delivered                                              Post partum procedures: None Augmentation: N/A Complications: None  Hospital course: Onset of Labor With Vaginal Delivery      20y.o. yo [id number] admitted in Active Labor on 12/13/2020. Patient had an uncomplicated labor course as follows:  Membrane Rupture Time/Date: 10:33 AM ,12/13/2020   Delivery Method:Vaginal, Spontaneous  Episiotomy: None  Lacerations:  None  Patient had an uncomplicated postpartum course.  She is ambulating, tolerating a regular diet, passing flatus, and urinating well. Patient is discharged home in stable condition on 12/15/20.  Newborn Data: Birth date:12/13/2020  Birth time:10:40 AM  Gender:Female  Living status:Living  Apgars:7 ,9  Weight:2327 g   Magnesium Sulfate received: No BMZ received: No - got half a dose at 30wks Rhophylac:N/A MMR:N/A T-DaP: Declined prenatally Flu: No Transfusion:No  Physical exam  Vitals:   12/14/20 0253 12/14/20 1430 12/14/20 1937 12/15/20 0434  BP: 122/67 116/73 102/67 113/66  Pulse: 71 77 (!) 57 65  Resp: '18 18 18 18  ' Temp: 97.8 F (36.6 C) 98 F (36.7 C) 98.2 F (36.8 C) 98 F (36.7 C)  TempSrc: Oral Oral Axillary Oral  SpO2: 100%      General: alert Lochia: appropriate Uterine Fundus: firm DVT  Evaluation: No evidence of DVT seen on physical exam. Labs: Lab Results  Component Value Date   WBC 10.8 (H) 12/14/2020   HGB 9.9 (L) 12/14/2020   HCT 31.3 (L) 12/14/2020   MCV 74.7 (L) 12/14/2020   PLT 201 12/14/2020   CMP Latest Ref Rng & Units 12/13/2020  Glucose 70 - 99 mg/dL 101(H)  BUN 6 - 20 mg/dL <5(L)  Creatinine 0.44 - 1.00 mg/dL 0.84  Sodium 135 - 145 mmol/L 133(L)  Potassium 3.5 - 5.1 mmol/L 3.3(L)  Chloride 98 - 111 mmol/L 102  CO2 22 - 32 mmol/L 22  Calcium 8.9 - 10.3 mg/dL 8.9  Total Protein 6.5 - 8.1 g/dL -  Total Bilirubin 0.3 - 1.2 mg/dL -  Alkaline Phos 38 - 126 U/L -  AST 15 - 41 U/L -  ALT 0 - 44 U/L -   Edinburgh Score: Edinburgh Postnatal Depression Scale Screening Tool 12/15/2020  I have been able to laugh and see the funny side of things. 0  I have looked forward with enjoyment to things. 0  I have blamed myself unnecessarily when things went wrong. 0  I have been anxious or worried for no good reason. 0  I have felt scared or panicky for no good reason. 0  Things have been getting on top of me. 0  I have been so unhappy that I  have had difficulty sleeping. 0  I have felt sad or miserable. 0  I have been so unhappy that I have been crying. 0  The thought of harming myself has occurred to me. 0  Edinburgh Postnatal Depression Scale Total 0     After visit meds:  Allergies as of 12/15/2020   No Known Allergies      Medication List     STOP taking these medications    metroNIDAZOLE 500 MG tablet Commonly known as: FLAGYL   ondansetron 4 MG disintegrating tablet Commonly known as: Zofran ODT       TAKE these medications    acetaminophen 325 MG tablet Commonly known as: Tylenol Take 2 tablets (650 mg total) by mouth every 4 (four) hours as needed (for pain scale < 4).   ferrous sulfate 325 (65 FE) MG tablet Take 1 tablet (325 mg total) by mouth every other day.   ibuprofen 600 MG tablet Commonly known as: ADVIL Take 1 tablet  (600 mg total) by mouth every 6 (six) hours.   oxyCODONE 5 MG immediate release tablet Commonly known as: Oxy IR/ROXICODONE Take 1 tablet (5 mg total) by mouth every 6 (six) hours as needed for severe pain.   terconazole 0.8 % vaginal cream Commonly known as: TERAZOL 3 Place 1 applicator vaginally at bedtime.   Vitafol Gummies 3.33-0.333-34.8 MG Chew Chew 3 tablets by mouth daily before breakfast.       Discharge home in stable condition Infant Feeding:  both Infant Disposition:home with mother Discharge instruction: per After Visit Summary and Postpartum booklet. Activity: Advance as tolerated. Pelvic rest for 6 weeks.  Diet: routine diet Future Appointments: Future Appointments  Date Time Provider [place]  01/24/2021  1:10 PM Gavin Pound, CNM [place] None   Follow up Visit (message sent to [facility] 8/11): Please schedule this patient for a In person postpartum visit in 6 weeks with the following provider: Any provider. Additional Postpartum F/U: None   Low risk pregnancy complicated by:  NOne Delivery mode:  Vaginal, Spontaneous  Anticipated Birth Control:  Depo   Renard Matter, MD, MPH OB Fellow, Faculty Practice

## 2020-12-13 NOTE — Lactation Note (Signed)
This note was copied from a baby's chart. Lactation Consultation Note  Patient Name: Amanda Davies JOINO'M Date: 12/13/2020 Reason for consult: Initial assessment;Early term 37-38.6wks;Infant < 6lbs Age:22 hours LC entered the room, mom was at the end of a feeding infant was given 18 mls of Similac Neosure 22 kcal with iron.  LC did not observe latch due infant being formula fed only with current feeding. Mom is an experienced breastfeeding woman and mom feels infant latches well at the breast, mom's choice is breast and formula feeding infant.  When mom is formula feeding infant she will use the DEBP to stimulate breast ( this is mom's choice) When LC left room, mom was using the DEBP and had pumped 5 mls of colostrum she plans to offer infant her EBM after she latches infant at the breast for the next feeding. Mom shown how to use DEBP & how to disassemble, clean, & reassemble parts.  LC discussed infant's input and output. Mom made aware of O/P services, breastfeeding support groups, community resources, and our phone # for post-discharge questions.    Mom's plan: 1- Mom will breastfeed infant according to cues, 8 to 12+ or more times within 24 hours, Skin to skin.  2-Mom plans to latch infant at the breast  for every other feeding ex: ( breast, formula , breast).  3- After latching infant at the breast mom will supplement infant with  EBM/ formula Day 1- ( 5- 7 mls ) per feeding , and if infant is "formula feed only"  mom plans to offer ( 5- 15 mls + ), and mom will pace feed infant.  4- Mom's choice mom will continue to use DEBP every 3 hours for 15 minutes, and will give infant back any EBM first before offering formula using a slow flow bottle nipple.  5- Mom knows to call RN or LC if she has any questions, concerns or needs assistance with latching infant at the breast.  Maternal Data  Has patient been taught Hand Expression?: Yes Does the patient have breastfeeding experience  prior to this delivery?: Yes How long did the patient breastfeed?: Per mom, she BF all her other children, 1st child for 11 months, 2nd child for 10 months, 3rd child for 10 months, and 4 th child at 76 months, she is currently 1 year, per mom  she stopped  BF 4th child due child teething and her being pregnant again ( breast sensitvity).  Feeding Mother's Current Feeding Choice: Breast Milk and Formula Nipple Type: Slow - flow  LATCH Score                    Lactation Tools Discussed/Used Tools: Pump Breast pump type: Double-Electric Breast Pump Pump Education: Setup, frequency, and cleaning;Milk Storage Reason for Pumping: Mom's choice, infant is ET less than 6 lbs at birth. Pumping frequency: Mom will pump every 3 hours for 15 minutes on inital setting. Pumped volume: 5 mL  Interventions Interventions: Breast feeding basics reviewed;Skin to skin;Hand express;Position options;Hand pump;Expressed milk;DEBP;Education  Discharge Pump: DEBP;Manual WIC Program: No  Consult Status Consult Status: Follow-up Date: 12/14/20 Follow-up type: In-patient    Danelle Earthly 12/13/2020, 10:04 PM

## 2020-12-14 DIAGNOSIS — Z3A37 37 weeks gestation of pregnancy: Secondary | ICD-10-CM | POA: Diagnosis not present

## 2020-12-14 DIAGNOSIS — O99824 Streptococcus B carrier state complicating childbirth: Secondary | ICD-10-CM | POA: Diagnosis not present

## 2020-12-14 LAB — CBC
HCT: 31.3 % — ABNORMAL LOW (ref 36.0–46.0)
Hemoglobin: 9.9 g/dL — ABNORMAL LOW (ref 12.0–15.0)
MCH: 23.6 pg — ABNORMAL LOW (ref 26.0–34.0)
MCHC: 31.6 g/dL (ref 30.0–36.0)
MCV: 74.7 fL — ABNORMAL LOW (ref 80.0–100.0)
Platelets: 201 10*3/uL (ref 150–400)
RBC: 4.19 MIL/uL (ref 3.87–5.11)
RDW: 14.6 % (ref 11.5–15.5)
WBC: 10.8 10*3/uL — ABNORMAL HIGH (ref 4.0–10.5)
nRBC: 0 % (ref 0.0–0.2)

## 2020-12-14 NOTE — Progress Notes (Signed)
Patient continues to complain of vaginal pain of a 9-10 and describes it as throbbing.  RN assessed perineum and saw no signs of swelling or trauma.  Will continue to monitor.  Aylla Huffine, [place]

## 2020-12-14 NOTE — Social Work (Signed)
CSW received consult for late and limited PNC.    CSW reviewed chart and is screening out consult as it does not meet criteria for automatic CSW involvement and infant drug screening.  MOB started care prior to 28 weeks and had at least 3 visits. MOB had visits on 3/15 at [redacted]w[redacted]d, 6/8 at [redacted]w[redacted]d and 7/12 at [redacted]w[redacted]d.   Please contact the Clinical Social Worker if needs arise, by [facility] request, or if MOB scores greater than 9/yes to question 10 on Edinburgh Postpartum Depression Screen.  Manfred Arch, LCSWA Clinical Social Work [employer] and [employer]  [phone]

## 2020-12-14 NOTE — Progress Notes (Addendum)
POSTPARTUM PROGRESS NOTE  Subjective: Amanda Davies is a 20 y.o. [id number] s/p PPD#1 SVD at [redacted]w[redacted]d.  She reports she doing well. No acute events overnight. She denies any problems with ambulating, voiding or po intake. Denies nausea or vomiting. She has passed flatus. Pain is well controlled.  Lochia is appropriate.  Objective: Blood pressure 122/67, pulse 71, temperature 97.8 F (36.6 C), temperature source Oral, resp. rate 18, SpO2 100 %, unknown if currently breastfeeding.  Physical Exam:  General: alert, cooperative and no distress Chest: no respiratory distress Abdomen: soft, non-tender  Uterine Fundus: firm and at level of umbilicus Extremities: no calf swelling or tenderness  no edema  Recent Labs    12/13/20 1119 12/14/20 0536  HGB 12.1 9.9*  HCT 38.8 31.3*    Assessment/Plan: Amanda Davies is a 20 y.o. [id number] s/p PPD#1 SVD at [redacted]w[redacted]d.  Routine Postpartum Care: Doing well, pain well-controlled.  -- Continue routine care, lactation support  -- Contraception: Depo-still needs -- Feeding: both -- Screened out by SW -- Needs circ -- Hgb 9.9-OP oral iron on D/C  Dispo: Plan for discharge PPD#2.  Alfredo Martinez, MD Faculty Practice, Center for [facility] 12/14/2020 12:03 PM  GME ATTESTATION:  I saw and evaluated the patient. I agree with the findings and the plan of care as documented in the resident's note. I have made changes to documentation as necessary.  Progressing well and meeting postpartum milestones. Circumcision completed today. Plan for discharge PPD#2.   Evalina Field, MD OB Fellow, Faculty [facility], Center for [facility] 12/14/2020 1:31 PM

## 2020-12-15 DIAGNOSIS — Z3A37 37 weeks gestation of pregnancy: Secondary | ICD-10-CM | POA: Diagnosis not present

## 2020-12-15 DIAGNOSIS — O99824 Streptococcus B carrier state complicating childbirth: Secondary | ICD-10-CM | POA: Diagnosis not present

## 2020-12-15 MED ORDER — ACETAMINOPHEN 325 MG PO TABS: 650.0000 mg | ORAL_TABLET | ORAL | 0 refills | Status: AC | PRN

## 2020-12-15 MED ORDER — FERROUS SULFATE 325 (65 FE) MG PO TABS
325.0000 mg | ORAL_TABLET | ORAL | Status: DC
  Administered 2020-12-15: 325 mg via ORAL
  Filled 2020-12-15: qty 1

## 2020-12-15 MED ORDER — OXYCODONE HCL 5 MG PO TABS: 5.0000 mg | ORAL_TABLET | Freq: Four times a day (QID) | ORAL | 0 refills | Status: DC | PRN

## 2020-12-15 MED ORDER — FERROUS SULFATE 325 (65 FE) MG PO TABS: 325.0000 mg | ORAL_TABLET | ORAL | 0 refills | Status: AC

## 2020-12-15 MED ORDER — IBUPROFEN 600 MG PO TABS: 600.0000 mg | ORAL_TABLET | Freq: Four times a day (QID) | ORAL | 0 refills | Status: DC

## 2020-12-15 NOTE — Lactation Note (Addendum)
This note was copied from a baby's chart. Lactation Consultation Note  Patient Name: Amanda Davies [id number] Date: 12/15/2020 Reason for consult: Follow-up assessment;Early term 37-38.6wks;Infant weight loss;Other (Comment) (7 % weight loss) Age:22 hours Per mom last fed at 7:30 for 15 mins .  Per mom feels like her milk is coming in.  Baby awake , mom attempting to latch in the cradle position.  LC offered to assist and worked on the depth and positioning.  Baby opened wide and was able to accommodate the nipple / areola complex , swallows noted and increased with breast compressions.  Due to the 7 % weight loss / and her milk being in discussed 2 options for feedings. Offer both breast at every feeding / or latch 1st breast / and supplement with EBM or formula after latch. Mom expressed to [facility] she would like to do both. ( Breast and formula )  LC reviewed BF D/C teaching see below and provided the [facility] brochure with resource numbers / BFSG .   Maternal Data Has patient been taught Hand Expression?: Yes  Feeding Mother's Current Feeding Choice: Breast Milk and Formula  LATCH Score Latch: Grasps breast easily, tongue down, lips flanged, rhythmical sucking.  Audible Swallowing: Spontaneous and intermittent  Type of Nipple: Everted at rest and after stimulation  Comfort (Breast/Nipple): Filling, red/small blisters or bruises, mild/mod discomfort  Hold (Positioning): Assistance needed to correctly position infant at breast and maintain latch.  LATCH Score: 8   Lactation Tools Discussed/Used Tools: Pump;Flanges Flange Size: 24 Breast pump type: Manual;Double-Electric Breast Pump Pump Education: Milk Storage  Interventions Interventions: Breast feeding basics reviewed;Assisted with latch;Skin to skin;Breast massage;Hand express;Reverse pressure;Breast compression;Adjust position;Support pillows;Position options;Hand pump;DEBP;Education  Discharge Discharge Education:  Engorgement and breast care;Warning signs for feeding baby Pump: Manual  Consult Status Consult Status: Complete Date: 12/15/20    Kathrin Greathouse 12/15/2020, 9:50 AM

## 2020-12-15 NOTE — Progress Notes (Signed)
Faculty Practice Post Partum Progress Note Post Partum Day 1 Subjective: Reports she overall feels well. Bleeding has improved and not passing any big clots. Otherwise up ad lib, voiding, and tolerating PO. Reports pain is at an 8/10 and is due for medication.  Objective: Blood pressure 113/66, pulse 65, temperature 98 F (36.7 C), temperature source Oral, resp. rate 18, SpO2 100 %, unknown if currently breastfeeding.  Physical Exam:  General: alert and cooperative Lochia: appropriate Uterine Fundus: firm DVT Evaluation: No evidence of DVT seen on physical exam.  Recent Labs    12/13/20 1119 12/14/20 0536  HGB 12.1 9.9*  HCT 38.8 31.3*    Assessment/Plan: Patient progressing well in post partum course. Received pain medication per [facility] after we discussed pain.  Post partum CBC HgB 12.1>9.9, will start PO iron Plan for discharge tomorrow  LOS: 2 days   Warner Mccreedy, MD, MPH OB Fellow, Faculty Practice

## 2020-12-17 ENCOUNTER — Telehealth: Payer: Self-pay

## 2020-12-17 ENCOUNTER — Encounter: Payer: Medicaid Other | Admitting: Obstetrics

## 2020-12-17 NOTE — Telephone Encounter (Signed)
Transition Care Management Unsuccessful Follow-up Telephone Call  Date of discharge and from where:  12/15/2020 from [facility]  Attempts:  1st Attempt  Reason for unsuccessful TCM follow-up call:  Left voice message

## 2020-12-18 NOTE — Telephone Encounter (Signed)
Transition Care Management Follow-up Telephone Call Date of discharge and from where: 12/15/2020 from [facility] How have you been since you were released from the hospital? Pt stated that she is still having some pain. Pt stated that she was able to pick all of her medication.  Any questions or concerns? No  Items Reviewed: Did the pt receive and understand the discharge instructions provided? Yes  Medications obtained and verified? Yes  Other? No  Any new allergies since your discharge? No  Dietary orders reviewed? No Do you have support at home? Yes   Functional Questionnaire: (I = Independent and D = Dependent) ADLs: I Bathing/Dressing- I Meal Prep- I Eating- I Maintaining continence- I Transferring/Ambulation- I Managing Meds- I  Follow up appointments reviewed:  PCP Hospital f/u appt confirmed? No   Specialist Hospital f/u appt confirmed? Yes  Scheduled to see Gerrit Heck, CNM on 01/24/2021 @ 1:10pm. Are transportation arrangements needed? No  If their condition worsens, is the pt aware to call PCP or go to the Emergency Dept.? Yes Was the patient provided with contact information for the PCP's office or ED? Yes Was to pt encouraged to call back with questions or concerns? Yes

## 2020-12-25 ENCOUNTER — Telehealth (HOSPITAL_COMMUNITY): Payer: Self-pay

## 2020-12-25 NOTE — Telephone Encounter (Signed)
"  I'm doing well." Patient declines questions or concerns about her healing.  "He's good. He is eating well and gaining weight. He sleeps in a crib."  RN reviewed ABC's of safe sleep with patient. Patient declines any questions or concerns about baby.   EPDS score is 0.  Marcelino Duster [facility] 12/25/2020,1205

## 2021-01-24 ENCOUNTER — Ambulatory Visit: Payer: Medicaid Other

## 2021-03-26 DIAGNOSIS — R000 Tachycardia, unspecified: Secondary | ICD-10-CM | POA: Diagnosis not present

## 2021-03-26 DIAGNOSIS — Z20822 Contact with and (suspected) exposure to covid-19: Secondary | ICD-10-CM | POA: Diagnosis not present

## 2021-03-26 DIAGNOSIS — R509 Fever, unspecified: Secondary | ICD-10-CM | POA: Diagnosis not present

## 2021-03-26 DIAGNOSIS — J101 Influenza due to other identified influenza virus with other respiratory manifestations: Secondary | ICD-10-CM | POA: Diagnosis not present

## 2021-03-26 DIAGNOSIS — M549 Dorsalgia, unspecified: Secondary | ICD-10-CM | POA: Diagnosis not present

## 2021-03-29 ENCOUNTER — Telehealth: Payer: Self-pay

## 2021-03-29 NOTE — Telephone Encounter (Signed)
Transition Care Management Follow-up Telephone Call Date of discharge and from where: 03/27/2021-[facility]  How have you been since you were released from the hospital? Patient stated she is doing ok.  Any questions or concerns? No  Items Reviewed: Did the pt receive and understand the discharge instructions provided? Yes  Medications obtained and verified? Yes  Other? No  Any new allergies since your discharge? No  Dietary orders reviewed? No Do you have support at home? Yes   Home Care and Equipment/Supplies: Were home health services ordered? not applicable If so, what is the name of the agency? N/A  Has the agency set up a time to come to the patient's home? not applicable Were any new equipment or medical supplies ordered?  No What is the name of the medical supply agency? N/A Were you able to get the supplies/equipment? not applicable Do you have any questions related to the use of the equipment or supplies? No  Functional Questionnaire: (I = Independent and D = Dependent) ADLs: I  Bathing/Dressing- I  Meal Prep- I  Eating- I  Maintaining continence- I  Transferring/Ambulation- I  Managing Meds- I  Follow up appointments reviewed:  PCP Hospital f/u appt confirmed? No   Specialist Hospital f/u appt confirmed? No   Are transportation arrangements needed? No  If their condition worsens, is the pt aware to call PCP or go to the Emergency Dept.? Yes Was the patient provided with contact information for the PCP's office or ED? Yes Was to pt encouraged to call back with questions or concerns? Yes

## 2022-01-15 ENCOUNTER — Ambulatory Visit: Payer: Medicaid Other

## 2022-03-13 ENCOUNTER — Ambulatory Visit (INDEPENDENT_AMBULATORY_CARE_PROVIDER_SITE_OTHER): Payer: Medicaid Other | Admitting: *Deleted

## 2022-03-13 VITALS — BP 110/74 | HR 89

## 2022-03-13 DIAGNOSIS — Z3481 Encounter for supervision of other normal pregnancy, first trimester: Secondary | ICD-10-CM

## 2022-03-13 DIAGNOSIS — O219 Vomiting of pregnancy, unspecified: Secondary | ICD-10-CM

## 2022-03-13 DIAGNOSIS — K5900 Constipation, unspecified: Secondary | ICD-10-CM

## 2022-03-13 DIAGNOSIS — Z3201 Encounter for pregnancy test, result positive: Secondary | ICD-10-CM

## 2022-03-13 DIAGNOSIS — Z3200 Encounter for pregnancy test, result unknown: Secondary | ICD-10-CM

## 2022-03-13 LAB — POCT URINE PREGNANCY: Preg Test, Ur: POSITIVE — AB

## 2022-03-13 MED ORDER — VITAFOL GUMMIES 3.33-0.333-34.8 MG PO CHEW: 1.0000 | CHEWABLE_TABLET | Freq: Every day | ORAL | 5 refills | Status: DC

## 2022-03-13 MED ORDER — ONDANSETRON 4 MG PO TBDP: 4.0000 mg | ORAL_TABLET | Freq: Four times a day (QID) | ORAL | 1 refills | Status: DC | PRN

## 2022-03-13 MED ORDER — DOCUSATE SODIUM 100 MG PO CAPS: 100.0000 mg | ORAL_CAPSULE | Freq: Two times a day (BID) | ORAL | 0 refills | Status: DC

## 2022-03-13 NOTE — Progress Notes (Signed)
Ms. Biswell presents today for UPT. She has no unusual complaints. LMP:   01/21/22 (approximate) OBJECTIVE: Appears well, in no apparent distress.  OB History     Gravida  6   Para  5   Term  3   Preterm  2   AB  1   Living  5      SAB  1   IAB      Ectopic      Multiple  0   Live Births  5          Home UPT Result: NA In-Office UPT result: Positive I have reviewed the patient's medical, obstetrical, social, and family histories, and medications.   ASSESSMENT: Positive pregnancy test  PLAN Prenatal care to be completed at: [facility]/ Intake scheduled at check out.  Pt requests Zofran for nausea/vomiting. States phenergan, diclegis, and supportive measures have been unsuccessful in previous pregnancies. Dr. Donavan Foil was consulted. OK to send RX Zofran if pt accepts risks after counseling. Advised of risks. Desires to continue with RX.

## 2022-03-13 NOTE — Progress Notes (Signed)
Patient was assessed and managed by nursing staff during this encounter. I have reviewed the chart and agree with the documentation and plan. I have also made any necessary editorial changes.  Warden Fillers, MD 03/13/2022 5:27 PM

## 2022-03-14 ENCOUNTER — Other Ambulatory Visit: Payer: Self-pay | Admitting: *Deleted

## 2022-03-14 DIAGNOSIS — Z3481 Encounter for supervision of other normal pregnancy, first trimester: Secondary | ICD-10-CM

## 2022-03-14 MED ORDER — VITAFOL GUMMIES 3.33-0.333-34.8 MG PO CHEW: 3.0000 | CHEWABLE_TABLET | Freq: Every day | ORAL | 11 refills | Status: DC

## 2022-03-21 ENCOUNTER — Other Ambulatory Visit: Payer: Self-pay | Admitting: General Practice

## 2022-03-21 DIAGNOSIS — O0990 Supervision of high risk pregnancy, unspecified, unspecified trimester: Secondary | ICD-10-CM

## 2022-04-03 ENCOUNTER — Ambulatory Visit (INDEPENDENT_AMBULATORY_CARE_PROVIDER_SITE_OTHER): Payer: Medicaid Other

## 2022-04-03 VITALS — BP 111/71 | HR 87 | Ht 62.0 in | Wt 147.8 lb

## 2022-04-03 DIAGNOSIS — Z3480 Encounter for supervision of other normal pregnancy, unspecified trimester: Secondary | ICD-10-CM | POA: Diagnosis not present

## 2022-04-03 DIAGNOSIS — O3680X0 Pregnancy with inconclusive fetal viability, not applicable or unspecified: Secondary | ICD-10-CM

## 2022-04-03 DIAGNOSIS — Z1332 Encounter for screening for maternal depression: Secondary | ICD-10-CM | POA: Diagnosis not present

## 2022-04-03 MED ORDER — BLOOD PRESSURE KIT DEVI: 1.0000 | 0 refills | Status: DC

## 2022-04-03 MED ORDER — VITAFOL GUMMIES 3.33-0.333-34.8 MG PO CHEW: 3.0000 | CHEWABLE_TABLET | Freq: Every day | ORAL | 11 refills | Status: DC

## 2022-04-03 MED ORDER — GOJJI WEIGHT SCALE MISC: 1.0000 | 0 refills | Status: DC

## 2022-04-03 NOTE — Progress Notes (Signed)
New OB Intake  I connected with [name] on 04/03/22 at  3:10 PM EST by in person and verified that I am speaking with the correct person using two identifiers. Nurse is located at [facility] and pt is located at [place].  I discussed the limitations, risks, security and privacy concerns of performing an evaluation and management service by telephone and the availability of in person appointments. I also discussed with the patient that there may be a patient responsible charge related to this service. The patient expressed understanding and agreed to proceed.  I explained I am completing New OB Intake today. We discussed EDD of 10/28/22 that is based on LMP of 01/21/22. Pt is G7/P3215. I reviewed her allergies, medications, Medical/Surgical/OB history, and appropriate screenings. I informed her of [facility] services. [facility] information placed in AVS. Based on history, this is a low risk pregnancy.  Patient Active Problem List   Diagnosis Date Noted   Encounter for vaginal delivery 12/13/2020   Supervision of normal pregnancy, antepartum 06/26/2020   PROM (premature rupture of membranes) 09/10/2019   Insufficient prenatal care in third trimester 07/21/2019   GBS bacteriuria 04/25/2019   Alpha thalassemia silent carrier 03/24/2019   Pyelonephritis affecting pregnancy 03/05/2019   Supervision of high risk pregnancy, antepartum 11/27/2016   H/O preterm delivery, currently pregnant 01/01/2016    Concerns addressed today  Delivery Plans Plans to deliver at [facility]. Patient given information for [facility] Healthy [facility] website for more information about [facility]. Patient is not interested in water birth. Offered upcoming OB visit with CNM to discuss further.  MyChart/Babyscripts MyChart access verified. I explained pt will have some visits in office and some virtually. Babyscripts instructions given and order placed. Patient verifies receipt of registration text/e-mail. Account  successfully created and app downloaded.  Blood Pressure Cuff/Weight Scale Blood pressure cuff ordered for patient to pick-up from [employer]. Explained after first prenatal appt pt will check weekly and document in Babyscripts. Patient does not have weight scale; order sent to [facility], patient may track weight weekly in Babyscripts.  Anatomy US Explained first scheduled Korea will be around 19 weeks. Dating and viability scan performed today. Anatomy US TBD.   Labs Discussed Avelina Laine genetic screening with patient. Would like both Panorama and Horizon drawn at new OB visit. Routine prenatal labs needed.  COVID Vaccine Patient has not had COVID vaccine.   Is patient a CenteringPregnancy candidate?  Declined Declined due to Group setting Not a candidate due to  If accepted,   Social Determinants of Health Food Insecurity: Patient denies food insecurity. WIC Referral: Patient is interested in referral to [facility].  Transportation: Patient denies transportation needs. Childcare: Discussed no children allowed at ultrasound appointments. Offered childcare services; patient declines childcare services at this time.  First visit review I reviewed new OB appt with patient. I explained they will have a provider visit that includes prenatal labs, std screen, pap smear, genetic screening, and discuss plan of care for pregnancy. Explained pt will be seen by Coral Ceo at first visit; encounter routed to appropriate provider. Explained that patient will be seen by pregnancy navigator following visit with provider.   Hamilton Capri, RN 04/03/2022  3:28 PM

## 2022-04-16 ENCOUNTER — Inpatient Hospital Stay (HOSPITAL_COMMUNITY)
Admission: AD | Admit: 2022-04-16 | Discharge: 2022-04-17 | Disposition: A | Discharge status: Home / Self Care | Payer: Medicaid Other | Attending: Obstetrics & Gynecology | Admitting: Obstetrics & Gynecology

## 2022-04-16 DIAGNOSIS — O219 Vomiting of pregnancy, unspecified: Secondary | ICD-10-CM | POA: Insufficient documentation

## 2022-04-16 DIAGNOSIS — Z3A12 12 weeks gestation of pregnancy: Secondary | ICD-10-CM | POA: Insufficient documentation

## 2022-04-16 DIAGNOSIS — M549 Dorsalgia, unspecified: Secondary | ICD-10-CM

## 2022-04-16 DIAGNOSIS — O09211 Supervision of pregnancy with history of pre-term labor, first trimester: Secondary | ICD-10-CM | POA: Insufficient documentation

## 2022-04-16 DIAGNOSIS — M545 Low back pain, unspecified: Secondary | ICD-10-CM | POA: Insufficient documentation

## 2022-04-16 DIAGNOSIS — O26891 Other specified pregnancy related conditions, first trimester: Secondary | ICD-10-CM | POA: Insufficient documentation

## 2022-04-17 ENCOUNTER — Encounter (HOSPITAL_COMMUNITY): Payer: Self-pay | Admitting: Obstetrics & Gynecology

## 2022-04-17 DIAGNOSIS — O219 Vomiting of pregnancy, unspecified: Secondary | ICD-10-CM

## 2022-04-17 DIAGNOSIS — M545 Low back pain, unspecified: Secondary | ICD-10-CM | POA: Diagnosis not present

## 2022-04-17 DIAGNOSIS — O09211 Supervision of pregnancy with history of pre-term labor, first trimester: Secondary | ICD-10-CM | POA: Diagnosis not present

## 2022-04-17 DIAGNOSIS — O99891 Other specified diseases and conditions complicating pregnancy: Secondary | ICD-10-CM

## 2022-04-17 DIAGNOSIS — Z3A12 12 weeks gestation of pregnancy: Secondary | ICD-10-CM | POA: Diagnosis not present

## 2022-04-17 DIAGNOSIS — M549 Dorsalgia, unspecified: Secondary | ICD-10-CM | POA: Diagnosis not present

## 2022-04-17 DIAGNOSIS — O26891 Other specified pregnancy related conditions, first trimester: Secondary | ICD-10-CM | POA: Diagnosis not present

## 2022-04-17 LAB — URINALYSIS, ROUTINE W REFLEX MICROSCOPIC
Bacteria, UA: NONE SEEN
Bilirubin Urine: NEGATIVE
Glucose, UA: NEGATIVE mg/dL
Hgb urine dipstick: NEGATIVE
Ketones, ur: NEGATIVE mg/dL
Nitrite: NEGATIVE
Protein, ur: 30 mg/dL — AB
Specific Gravity, Urine: 1.035 — ABNORMAL HIGH (ref 1.005–1.030)
pH: 5 (ref 5.0–8.0)

## 2022-04-17 LAB — BASIC METABOLIC PANEL
Anion gap: 9 (ref 5–15)
BUN: 8 mg/dL (ref 6–20)
CO2: 21 mmol/L — ABNORMAL LOW (ref 22–32)
Calcium: 9.1 mg/dL (ref 8.9–10.3)
Chloride: 104 mmol/L (ref 98–111)
Creatinine, Ser: 0.6 mg/dL (ref 0.44–1.00)
GFR, Estimated: >60 mL/min (ref >60)
Glucose, Bld: 87 mg/dL (ref 70–99)
Potassium: 3.5 mmol/L (ref 3.5–5.1)
Sodium: 134 mmol/L — ABNORMAL LOW (ref 135–145)

## 2022-04-17 LAB — CBC WITH DIFFERENTIAL/PLATELET
Abs Immature Granulocytes: 0.04 10*3/uL (ref 0.00–0.07)
Basophils Absolute: 0 10*3/uL (ref 0.0–0.1)
Basophils Relative: 0 %
Eosinophils Absolute: 0 10*3/uL (ref 0.0–0.5)
Eosinophils Relative: 0 %
HCT: 40.6 % (ref 36.0–46.0)
Hemoglobin: 13.6 g/dL (ref 12.0–15.0)
Immature Granulocytes: 1 %
Lymphocytes Relative: 24 %
Lymphs Abs: 1.6 10*3/uL (ref 0.7–4.0)
MCH: 25.3 pg — ABNORMAL LOW (ref 26.0–34.0)
MCHC: 33.5 g/dL (ref 30.0–36.0)
MCV: 75.5 fL — ABNORMAL LOW (ref 80.0–100.0)
Monocytes Absolute: 0.8 10*3/uL (ref 0.1–1.0)
Monocytes Relative: 13 %
Neutro Abs: 4 10*3/uL (ref 1.7–7.7)
Neutrophils Relative %: 62 %
Platelets: 229 10*3/uL (ref 150–400)
RBC: 5.38 MIL/uL — ABNORMAL HIGH (ref 3.87–5.11)
RDW: 14.9 % (ref 11.5–15.5)
WBC: 6.5 10*3/uL (ref 4.0–10.5)
nRBC: 0 % (ref 0.0–0.2)

## 2022-04-17 MED ORDER — LACTATED RINGERS IV BOLUS
1000.0000 mL | Freq: Once | INTRAVENOUS | Status: AC
  Administered 2022-04-17: 1000 mL via INTRAVENOUS

## 2022-04-17 MED ORDER — METOCLOPRAMIDE HCL 5 MG/ML IJ SOLN
10.0000 mg | Freq: Once | INTRAMUSCULAR | Status: AC
  Administered 2022-04-17: 10 mg via INTRAVENOUS
  Filled 2022-04-17: qty 2

## 2022-04-17 MED ORDER — SODIUM CHLORIDE 0.9 % IV SOLN
8.0000 mg | Freq: Once | INTRAVENOUS | Status: AC
  Administered 2022-04-17: 8 mg via INTRAVENOUS
  Filled 2022-04-17: qty 4

## 2022-04-17 MED ORDER — CYCLOBENZAPRINE HCL 10 MG PO TABS: 10.0000 mg | ORAL_TABLET | Freq: Three times a day (TID) | ORAL | 0 refills | Status: DC | PRN

## 2022-04-17 MED ORDER — CYCLOBENZAPRINE HCL 5 MG PO TABS
10.0000 mg | ORAL_TABLET | Freq: Once | ORAL | Status: AC
  Administered 2022-04-17: 10 mg via ORAL
  Filled 2022-04-17: qty 2

## 2022-04-17 NOTE — MAU Note (Signed)
.  Amanda Davies is a 21 y.o. at [redacted]w[redacted]d here in MAU reporting: N/V started Wednesday am around MN, pt report 1-20 episodes of emesis, last at 2230 yesterday. Pt denies unable to eat or drink without vomiting. Pt took her Zofran, with no relief. Pt states she feels dehydrated. Pt denies VB or LOF.   Onset of complaint: 0000 yesterday  Pain score: 0/10 Vitals:   04/17/22 0043  BP: 113/64  Pulse: (!) 106  Resp: 18  Temp: 98.3 F (36.8 C)  SpO2: 100%     FHT:161 Lab orders placed from triage:  ua

## 2022-04-17 NOTE — MAU Provider Note (Signed)
History     CSN: [id number]  Arrival date and time: 04/16/22 2300   Event Date/Time   First Provider Initiated Contact with Patient 04/17/22 0206      Chief Complaint  Patient presents with   Nausea   Emesis   HPI Amanda Davies is a 21 y.o. [id number] at 38w2dwho receives care at [facility].  Patient is scheduled for her next appt on Wednesday Dec 20th.  She presents today for Nausea and Emesis.  She reports her symptoms started yesterday around midnight and she has had 15-20 incidents since that time.  She reports that her appetite is decreased, but she has eaten throughout the day.  She states that she can only eat a couple bites before she vomits.  She states she took Zofran with no relief of her nausea.  Patient also reports lower back pain that she rates a 9/10.  She describes the pain as constant uncomfortable and like a spasm.  Pancakes and bacon Beefaroni Apples and oranges Steak, broccoli, rice  OB History     Gravida  7   Para  5   Term  3   Preterm  2   AB  1   Living  5      SAB  1   IAB      Ectopic      Multiple  0   Live Births  5           Past Medical History:  Diagnosis Date   Acne    Asthma    when younger   Chlamydia    Hx of pre-term labor     Past Surgical History:  Procedure Laterality Date   NO PAST SURGERIES      Family History  Problem Relation Age of Onset   Anxiety disorder Mother    Asthma Sister    Asthma Brother     Social History   Tobacco Use   Smoking status: Never   Smokeless tobacco: Never  Vaping Use   Vaping Use: Never used  Substance Use Topics   Alcohol use: No   Drug use: No    Allergies: No Known Allergies  Medications Prior to Admission  Medication Sig Dispense Refill Last Dose   ondansetron (ZOFRAN-ODT) 4 MG disintegrating tablet Take 1 tablet (4 mg total) by mouth every 6 (six) hours as needed for nausea. 20 tablet 1 04/16/2022   Prenatal Vit-Fe Phos-FA-Omega (VITAFOL GUMMIES)  3.33-0.333-34.8 MG CHEW Chew 3 tablets by mouth daily. 90 tablet 11 04/16/2022   Blood Pressure Monitoring (BLOOD PRESSURE KIT) DEVI 1 kit by Does not apply route once a week. 1 each 0    docusate sodium (COLACE) 100 MG capsule Take 1 capsule (100 mg total) by mouth 2 (two) times daily. 10 capsule 0    ferrous sulfate 325 (65 FE) MG tablet Take 1 tablet (325 mg total) by mouth every other day. 30 tablet 0    Misc. Devices (GOJJI WEIGHT SCALE) MISC 1 Device by Does not apply route every 30 (thirty) days. 1 each 0    Prenatal Vit-Fe Phos-FA-Omega (VITAFOL GUMMIES) 3.33-0.333-34.8 MG CHEW Chew 3 tablets by mouth daily. (Patient not taking: Reported on 04/03/2022) 90 tablet 11    terconazole (TERAZOL 3) 0.8 % vaginal cream Place 1 applicator vaginally at bedtime. 20 g 0     Review of Systems  Gastrointestinal:  Positive for nausea and vomiting. Negative for abdominal pain.  Genitourinary:  Negative for difficulty urinating, dysuria,  vaginal bleeding and vaginal discharge.  Musculoskeletal:  Positive for back pain.  Neurological:  Negative for dizziness, light-headedness and headaches.   Physical Exam   Blood pressure 113/64, pulse (!) 106, temperature 98.3 F (36.8 C), temperature source Oral, resp. rate 18, height _0  (1.575 m), weight 66.3 kg, last menstrual period 01/21/2022, SpO2 100 %, unknown if currently breastfeeding.  Physical Exam Vitals reviewed.  Constitutional:      General: She is not in acute distress.    Appearance: Normal appearance.  HENT:     Head: Normocephalic and atraumatic.  Eyes:     Conjunctiva/sclera: Conjunctivae normal.  Cardiovascular:     Rate and Rhythm: Normal rate.  Pulmonary:     Effort: Pulmonary effort is normal. No respiratory distress.  Musculoskeletal:        General: Normal range of motion.     Cervical back: Normal range of motion.  Skin:    General: Skin is warm and dry.  Neurological:     Mental Status: She is alert and oriented to  person, place, and time.  Psychiatric:        Mood and Affect: Mood normal.        Behavior: Behavior normal.     MAU Course  Procedures Results for orders placed or performed during the hospital encounter of 04/16/22 (from the past 24 hour(s))  Urinalysis, Routine w reflex microscopic Urine, Clean Catch     Status: Abnormal   Collection Time: 04/17/22 12:32 AM  Result Value Ref Range   Color, Urine AMBER (A) YELLOW   APPearance HAZY (A) CLEAR   Specific Gravity, Urine 1.035 (H) 1.005 - 1.030   pH 5.0 5.0 - 8.0   Glucose, UA NEGATIVE NEGATIVE mg/dL   Hgb urine dipstick NEGATIVE NEGATIVE   Bilirubin Urine NEGATIVE NEGATIVE   Ketones, ur NEGATIVE NEGATIVE mg/dL   Protein, ur 30 (A) NEGATIVE mg/dL   Nitrite NEGATIVE NEGATIVE   Leukocytes,Ua TRACE (A) NEGATIVE   RBC / HPF 0-5 0 - 5 RBC/hpf   WBC, UA 6-10 0 - 5 WBC/hpf   Bacteria, UA NONE SEEN NONE SEEN   Squamous Epithelial / LPF 0-5 0 - 5   Mucus PRESENT   CBC with Differential/Platelet     Status: Abnormal   Collection Time: 04/17/22  1:57 AM  Result Value Ref Range   WBC 6.5 4.0 - 10.5 K/uL   RBC 5.38 (H) 3.87 - 5.11 MIL/uL   Hemoglobin 13.6 12.0 - 15.0 g/dL   HCT 40.6 36.0 - 46.0 %   MCV 75.5 (L) 80.0 - 100.0 fL   MCH 25.3 (L) 26.0 - 34.0 pg   MCHC 33.5 30.0 - 36.0 g/dL   RDW 14.9 11.5 - 15.5 %   Platelets 229 150 - 400 K/uL   nRBC 0.0 0.0 - 0.2 %   Neutrophils Relative % 62 %   Neutro Abs 4.0 1.7 - 7.7 K/uL   Lymphocytes Relative 24 %   Lymphs Abs 1.6 0.7 - 4.0 K/uL   Monocytes Relative 13 %   Monocytes Absolute 0.8 0.1 - 1.0 K/uL   Eosinophils Relative 0 %   Eosinophils Absolute 0.0 0.0 - 0.5 K/uL   Basophils Relative 0 %   Basophils Absolute 0.0 0.0 - 0.1 K/uL   Immature Granulocytes 1 %   Abs Immature Granulocytes 0.04 0.00 - 0.07 K/uL  Basic metabolic panel     Status: Abnormal   Collection Time: 04/17/22  1:57 AM  Result Value Ref  Range   Sodium 134 (L) 135 - 145 mmol/L   Potassium 3.5 3.5 - 5.1 mmol/L    Chloride 104 98 - 111 mmol/L   CO2 21 (L) 22 - 32 mmol/L   Glucose, Bld 87 70 - 99 mg/dL   BUN 8 6 - 20 mg/dL   Creatinine, Ser 0.60 0.44 - 1.00 mg/dL   Calcium 9.1 8.9 - 10.3 mg/dL   GFR, Estimated >60 >60 mL/min   Anion gap 9 5 - 15    MDM Start IV LR Bolus Antiemetic Muscle Relaxant Prescription Assessment and Plan  21 year old, [id number]  SIUP at 12.2 weeks Nausea/Vomiting Back Pain  -Reviewed POC with patient. -Start IV and Give LR Bolus -Zofran for nausea -Discussed flexeril usage for back pain once nausea has resolved. -Patient agreeable. -Monitor and reassess.    Maryann Conners 04/17/2022, 2:06 AM   Reassessment (4:13 AM) -Patient reports continued nausea. -Reglan IV ordered -Flexeril ordered for back pain -Okay for clear fluids and crackers once nausea resolved.   Reassessment (5:11 AM) -Nurse reports patient tolerated PO challenge without issues. -Provider to bedside and patient reports nausea resolved. -Reports she does not require additional prescription for anti-emetic, but does desire muscle relaxant for back pain. -Flexeril sent to pharmacy on file.  -Patient to follow up as scheduled. -Encouraged to call primary office or return to MAU if symptoms worsen or with the onset of new symptoms. -Discharged to home in improved condition.  Maryann Conners MSN, CNM Advanced Practice Provider, Center for [employer]

## 2022-04-23 ENCOUNTER — Encounter: Payer: Medicaid Other | Admitting: Obstetrics

## 2022-05-05 NOTE — L&D Delivery Note (Addendum)
OB/GYN Faculty Practice Delivery Note  Amanda Davies is a 22 y.o. [id number] s/p VD at [redacted]w[redacted]d. She was admitted for SOL.   ROM: 2h 45m with clear fluid GBS Status: Unknown, PCN ppx   Maximum Maternal Temperature: 98.1  Labor Progress: Initial SVE: 5.5/70/-1. She then progressed to complete.   Delivery Date/Time: 10/15/2022 @1621  Delivery: Called to room and patient was complete and pushing. Head delivered ROA. Loose nuchal cord present x1. Shoulder and body delivered in usual fashion. Infant with spontaneous cry, placed on mother's abdomen, dried and stimulated. Cord clamped x 2 after 1-minute delay, and cut by grandmother of the patient. Cord blood drawn. Placenta delivered spontaneously with gentle cord traction. Fundus firm with massage and Pitocin. Labia, perineum, vagina, and cervix inspected. There were no lacerations.  Baby Weight: pending  Placenta: 3 vessel, intact. Sent to L&D Complications: None Lacerations: None EBL: 53 mL Analgesia: Epidural   Infant:  APGAR (1 MIN): 8 APGAR (5 MINS): 9  Amanda Cardella Autry-Lott, DO OB Fellow, Faculty [employer], Center for [employer] 10/15/2022, 5:49 PM

## 2022-05-09 ENCOUNTER — Other Ambulatory Visit: Payer: Self-pay | Admitting: Emergency Medicine

## 2022-05-09 MED ORDER — PROMETHAZINE HCL 25 MG PO TABS: 25.0000 mg | ORAL_TABLET | Freq: Four times a day (QID) | ORAL | 1 refills | Status: DC | PRN

## 2022-05-09 MED ORDER — ONDANSETRON HCL 8 MG PO TABS: 8.0000 mg | ORAL_TABLET | Freq: Three times a day (TID) | ORAL | 1 refills | Status: DC | PRN

## 2022-05-09 NOTE — Progress Notes (Signed)
Rx for nausea, per Jodi Mourning, MD.

## 2022-06-03 ENCOUNTER — Encounter: Payer: Medicaid Other | Admitting: Obstetrics

## 2022-06-05 ENCOUNTER — Other Ambulatory Visit (HOSPITAL_COMMUNITY)
Admission: RE | Admit: 2022-06-05 | Discharge: 2022-06-05 | Disposition: A | Discharge status: Home / Self Care | Payer: Medicaid Other | Source: Ambulatory Visit | Attending: Obstetrics | Admitting: Obstetrics

## 2022-06-05 ENCOUNTER — Ambulatory Visit (INDEPENDENT_AMBULATORY_CARE_PROVIDER_SITE_OTHER): Payer: Medicaid Other | Admitting: Obstetrics

## 2022-06-05 ENCOUNTER — Encounter: Payer: Self-pay | Admitting: Obstetrics

## 2022-06-05 VITALS — BP 107/72 | HR 92 | Wt 150.6 lb

## 2022-06-05 DIAGNOSIS — Z8759 Personal history of other complications of pregnancy, childbirth and the puerperium: Secondary | ICD-10-CM

## 2022-06-05 DIAGNOSIS — O09899 Supervision of other high risk pregnancies, unspecified trimester: Secondary | ICD-10-CM

## 2022-06-05 DIAGNOSIS — O0990 Supervision of high risk pregnancy, unspecified, unspecified trimester: Secondary | ICD-10-CM | POA: Insufficient documentation

## 2022-06-05 DIAGNOSIS — O09892 Supervision of other high risk pregnancies, second trimester: Secondary | ICD-10-CM

## 2022-06-05 DIAGNOSIS — Z87440 Personal history of urinary (tract) infections: Secondary | ICD-10-CM

## 2022-06-05 DIAGNOSIS — Z3A19 19 weeks gestation of pregnancy: Secondary | ICD-10-CM

## 2022-06-05 DIAGNOSIS — O0992 Supervision of high risk pregnancy, unspecified, second trimester: Secondary | ICD-10-CM | POA: Diagnosis not present

## 2022-06-05 NOTE — Addendum Note (Signed)
Addended by: Flonnie Hailstone on: 06/05/2022 04:33 PM   Modules accepted: Orders

## 2022-06-05 NOTE — Progress Notes (Signed)
Subjective:    Amanda Davies is being seen today for her first obstetrical visit.  This is not a planned pregnancy. She is at [redacted]w[redacted]d gestation. Her obstetrical history is significant for preterm labor, pyelonephritis and group B strep colonizer. Relationship with FOB: significant other, not living together. Patient does intend to breast feed. Pregnancy history fully reviewed.  The information documented in the HPI was reviewed and verified.  Menstrual History: OB History     Gravida  7   Para  5   Term  3   Preterm  2   AB  1   Living  5      SAB  1   IAB      Ectopic      Multiple  0   Live Births  5            Patient's last menstrual period was 01/21/2022 (approximate).    Past Medical History:  Diagnosis Date   Acne    Asthma    when younger   Chlamydia    Hx of pre-term labor     Past Surgical History:  Procedure Laterality Date   NO PAST SURGERIES      (Not in a hospital admission)  No Known Allergies  Social History   Tobacco Use   Smoking status: Never   Smokeless tobacco: Never  Substance Use Topics   Alcohol use: No    Family History  Problem Relation Age of Onset   Anxiety disorder Mother    Asthma Sister    Asthma Brother      Review of Systems Constitutional: negative for weight loss Gastrointestinal: negative for vomiting Genitourinary:negative for genital lesions and vaginal discharge and dysuria Musculoskeletal:negative for back pain Behavioral/Psych: negative for abusive relationship, depression, illegal drug usage and tobacco use    Objective:    BP 107/72   Pulse 92   Wt 150 lb 9.6 oz (68.3 kg)   LMP 01/21/2022 (Approximate)   BMI 27.55 kg/m  General Appearance:    Alert, cooperative, no distress, appears stated age  Head:    Normocephalic, without obvious abnormality, atraumatic  Eyes:    PERRL, conjunctiva/corneas clear, EOM's intact, fundi    benign, both eyes  Ears:    Normal TM's and external ear canals,  both ears  Nose:   Nares normal, septum midline, mucosa normal, no drainage    or sinus tenderness  Throat:   Lips, mucosa, and tongue normal; teeth and gums normal  Neck:   Supple, symmetrical, trachea midline, no adenopathy;    thyroid:  no enlargement/tenderness/nodules; no carotid   bruit or JVD  Back:     Symmetric, no curvature, ROM normal, no CVA tenderness  Lungs:     Clear to auscultation bilaterally, respirations unlabored  Chest Wall:    No tenderness or deformity   Heart:    Regular rate and rhythm, S1 and S2 normal, no murmur, rub   or gallop  Breast Exam:    No tenderness, masses, or nipple abnormality  Abdomen:     Soft, non-tender, bowel sounds active all four quadrants,    no masses, no organomegaly  Genitalia:    Normal female without lesion, discharge or tenderness  Extremities:   Extremities normal, atraumatic, no cyanosis or edema  Pulses:   2+ and symmetric all extremities  Skin:   Skin color, texture, turgor normal, no rashes or lesions  Lymph nodes:   Cervical, supraclavicular, and axillary nodes normal  Neurologic:  CNII-XII intact, normal strength, sensation and reflexes    throughout      Lab Review Urine pregnancy test Labs reviewed yes Radiologic studies reviewed yes  Assessment:    Pregnancy at [redacted]w[redacted]d weeks    Plan:      Prenatal vitamins.  Counseling provided regarding continued use of seat belts, cessation of alcohol consumption, smoking or use of illicit drugs; infection precautions i.e., influenza/TDAP immunizations, toxoplasmosis,CMV, parvovirus, listeria and varicella; workplace safety, exercise during pregnancy; routine dental care, safe medications, sexual activity, hot tubs, saunas, pools, travel, caffeine use, fish and methlymercury, potential toxins, hair treatments, varicose veins Weight gain recommendations per IOM guidelines reviewed: underweight/BMI< 18.5--> gain 28 - 40 lbs; normal weight/BMI 18.5 - 24.9--> gain 25 - 35 lbs;  overweight/BMI 25 - 29.9--> gain 15 - 25 lbs; obese/BMI >30->gain  11 - 20 lbs Problem list reviewed and updated. FIRST/CF mutation testing/NIPT/QUAD SCREEN/fragile X/Ashkenazi Jewish population testing/Spinal muscular atrophy discussed: requested. Role of ultrasound in pregnancy discussed; fetal survey: requested. Amniocentesis discussed: not indicated.  No orders of the defined types were placed in this encounter.  Orders Placed This Encounter  Procedures   Culture, OB Urine   [place] MFM OB DETAIL +14 WK    Standing Status:   Future    Standing Expiration Date:   06/06/2023    Order Specific Question:   Reason for Exam (SYMPTOM  OR DIAGNOSIS REQUIRED)    Answer:   Anatomy    Order Specific Question:   Preferred Location    Answer:   [facility] Ultrasound   CBC/D/Plt+RPR+Rh+ABO+RubIgG...   Panorama Prenatal Test Full Panel    ==========Department Information========== ID: [id number] Department:[facility] [place], [place] [address] Dept: [phone] Dept Fax: [phone]     Order Specific Question:   Expected due date (MM/DD/YYYY):    Answer:   10/28/2022    Order Specific Question:   Is this a twin pregnancy? (viable, no vanished twin)    Answer:   No    Order Specific Question:   Is this a surrogate or egg donor pregnancy?    Answer:   No    Order Specific Question:   I want fetal sex included in the report:    Answer:   Yes    Order Specific Question:   Maternal Weight (lbs):    Answer:   150    Order Specific Question:   Which Microdeletion Panel should be ordered?    Answer:   Extended Panel    Order Specific Question:   What type of billing?    Answer:   Programme researcher, broadcasting/film/video Question:   By placing this electronic order I confirm the testing ordered herein is medically necessary and this patient has been informed of the details of the genetic test(s) ordered,  including the risks, benefits, and alternatives, and has consented to testing.    Answer:   Yes    Order Specific Question:   Select an order diagnosis: For additional options refer to [email or web address]    Answer:   Encounter for supervision of other normal pregnancy in second trimester [[id number]]   HORIZON Custom    ==========Department Information========== ID: [id number] [place] [address]  [place], [place] [address] Dept: [phone] Dept Fax: [phone]     Order Specific Question:   Specify the name or ID of a valid Horizon Custom Panel:    Answer:   HBasic    Order Specific Question:   Is patient pregnant?    Answer:   Yes    Order Specific Question:   Practice ensures that HIPAA consent is obtained and will make available to [facility] upon request?    Answer:   Yes    Order Specific Question:   By placing this electronic order I confirm the testing ordered herein is medically necessary and this patient has been informed of the details of the genetic test(s) ordered, including the risks, benefits, and alternatives, and has consented to testing.    Answer:   Yes    Order Specific Question:   What type of billing?    Answer:   Programme researcher, broadcasting/film/video Question:   Select an order diagnosis: For additional options refer to [email or web address]    Answer:   Encounter of female for testing for genetic disease carrier status for procreative management [phone]    Order Specific Question:   Tay-Sachs add-on test?    Answer:   No   Hemoglobin A1c    Follow up in 4 weeks.  Shelly Bombard, MD 06/05/2022 2:05 PM

## 2022-06-05 NOTE — Progress Notes (Signed)
Pt presents for NOB visit. Pt c/o back pain and nausea with prenatal vitamins. Requesting prenatal gummies. Last PAP 07/2020. Pt decline PAP today.

## 2022-06-06 ENCOUNTER — Other Ambulatory Visit: Payer: Self-pay | Admitting: Obstetrics

## 2022-06-06 DIAGNOSIS — B379 Candidiasis, unspecified: Secondary | ICD-10-CM

## 2022-06-06 LAB — CBC/D/PLT+RPR+RH+ABO+RUBIGG...
Antibody Screen: NEGATIVE
Basophils Absolute: 0 10*3/uL (ref 0.0–0.2)
Basos: 0 %
EOS (ABSOLUTE): 0.1 10*3/uL (ref 0.0–0.4)
Eos: 1 %
HCV Ab: NONREACTIVE
HIV Screen 4th Generation wRfx: NONREACTIVE
Hematocrit: 38.6 % (ref 34.0–46.6)
Hemoglobin: 12.7 g/dL (ref 11.1–15.9)
Hepatitis B Surface Ag: NEGATIVE
Immature Grans (Abs): 0.1 10*3/uL (ref 0.0–0.1)
Immature Granulocytes: 1 %
Lymphocytes Absolute: 2.2 10*3/uL (ref 0.7–3.1)
Lymphs: 25 %
MCH: 25.2 pg — ABNORMAL LOW (ref 26.6–33.0)
MCHC: 32.9 g/dL (ref 31.5–35.7)
MCV: 77 fL — ABNORMAL LOW (ref 79–97)
Monocytes Absolute: 0.8 10*3/uL (ref 0.1–0.9)
Monocytes: 9 %
Neutrophils Absolute: 5.3 10*3/uL (ref 1.4–7.0)
Neutrophils: 64 %
Platelets: 225 10*3/uL (ref 150–450)
RBC: 5.04 x10E6/uL (ref 3.77–5.28)
RDW: 14.3 % (ref 11.7–15.4)
RPR Ser Ql: NONREACTIVE
Rh Factor: POSITIVE
Rubella Antibodies, IGG: 12.2 index (ref >0.99)
WBC: 8.5 10*3/uL (ref 3.4–10.8)

## 2022-06-06 LAB — CERVICOVAGINAL ANCILLARY ONLY
Bacterial Vaginitis (gardnerella): NEGATIVE
Candida Glabrata: NEGATIVE
Candida Vaginitis: POSITIVE — AB
Chlamydia: NEGATIVE
Comment: NEGATIVE
Comment: NEGATIVE
Comment: NEGATIVE
Comment: NEGATIVE
Comment: NEGATIVE
Comment: NORMAL
Neisseria Gonorrhea: NEGATIVE
Trichomonas: NEGATIVE

## 2022-06-06 LAB — HCV INTERPRETATION

## 2022-06-06 LAB — HEMOGLOBIN A1C
Est. average glucose Bld gHb Est-mCnc: 105 mg/dL
Hgb A1c MFr Bld: 5.3 % (ref 4.8–5.6)

## 2022-06-06 MED ORDER — TERCONAZOLE 0.8 % VA CREA: 1.0000 | TOPICAL_CREAM | Freq: Every day | VAGINAL | 0 refills | Status: DC

## 2022-06-09 LAB — CULTURE, OB URINE

## 2022-06-09 LAB — URINE CULTURE, OB REFLEX

## 2022-06-10 ENCOUNTER — Other Ambulatory Visit: Payer: Self-pay | Admitting: Obstetrics

## 2022-06-10 DIAGNOSIS — N3000 Acute cystitis without hematuria: Secondary | ICD-10-CM

## 2022-06-10 MED ORDER — CEFUROXIME AXETIL 500 MG PO TABS: 500.0000 mg | ORAL_TABLET | Freq: Two times a day (BID) | ORAL | 0 refills | Status: DC

## 2022-06-12 ENCOUNTER — Other Ambulatory Visit: Payer: Self-pay

## 2022-06-12 DIAGNOSIS — B379 Candidiasis, unspecified: Secondary | ICD-10-CM

## 2022-06-12 MED ORDER — TERCONAZOLE 0.4 % VA CREA: 1.0000 | TOPICAL_CREAM | Freq: Every day | VAGINAL | 0 refills | Status: DC

## 2022-06-13 LAB — PANORAMA PRENATAL TEST FULL PANEL:PANORAMA TEST PLUS 5 ADDITIONAL MICRODELETIONS: FETAL FRACTION: 7.5

## 2022-06-17 ENCOUNTER — Encounter: Payer: Self-pay | Admitting: Emergency Medicine

## 2022-06-20 LAB — HORIZON CUSTOM: REPORT SUMMARY: POSITIVE — AB

## 2022-06-24 ENCOUNTER — Encounter: Payer: Self-pay | Admitting: Emergency Medicine

## 2022-06-27 ENCOUNTER — Ambulatory Visit: Payer: Medicaid Other | Attending: Obstetrics

## 2022-06-27 ENCOUNTER — Other Ambulatory Visit: Payer: Self-pay | Admitting: Obstetrics and Gynecology

## 2022-06-27 ENCOUNTER — Encounter: Payer: Self-pay | Admitting: *Deleted

## 2022-06-27 ENCOUNTER — Ambulatory Visit: Payer: Medicaid Other | Admitting: *Deleted

## 2022-06-27 VITALS — BP 97/65 | HR 88

## 2022-06-27 DIAGNOSIS — O0942 Supervision of pregnancy with grand multiparity, second trimester: Secondary | ICD-10-CM | POA: Diagnosis not present

## 2022-06-27 DIAGNOSIS — D563 Thalassemia minor: Secondary | ICD-10-CM

## 2022-06-27 DIAGNOSIS — Z3A22 22 weeks gestation of pregnancy: Secondary | ICD-10-CM

## 2022-06-27 DIAGNOSIS — O09212 Supervision of pregnancy with history of pre-term labor, second trimester: Secondary | ICD-10-CM

## 2022-06-27 DIAGNOSIS — Z3689 Encounter for other specified antenatal screening: Secondary | ICD-10-CM

## 2022-06-27 DIAGNOSIS — O0990 Supervision of high risk pregnancy, unspecified, unspecified trimester: Secondary | ICD-10-CM | POA: Insufficient documentation

## 2022-07-03 ENCOUNTER — Encounter: Payer: Medicaid Other | Admitting: Obstetrics

## 2022-07-07 ENCOUNTER — Other Ambulatory Visit: Payer: Self-pay

## 2022-07-07 DIAGNOSIS — O219 Vomiting of pregnancy, unspecified: Secondary | ICD-10-CM

## 2022-07-07 MED ORDER — ONDANSETRON HCL 8 MG PO TABS: 8.0000 mg | ORAL_TABLET | Freq: Three times a day (TID) | ORAL | 0 refills | Status: DC | PRN

## 2022-07-09 ENCOUNTER — Encounter: Payer: Medicaid Other | Admitting: Obstetrics

## 2022-07-10 ENCOUNTER — Encounter: Payer: Medicaid Other | Admitting: Obstetrics

## 2022-07-20 ENCOUNTER — Other Ambulatory Visit: Payer: Self-pay | Admitting: Obstetrics & Gynecology

## 2022-07-20 ENCOUNTER — Encounter: Payer: Self-pay | Admitting: Obstetrics & Gynecology

## 2022-07-20 DIAGNOSIS — O219 Vomiting of pregnancy, unspecified: Secondary | ICD-10-CM

## 2022-07-20 MED ORDER — ONDANSETRON 8 MG PO TBDP: 8.0000 mg | ORAL_TABLET | Freq: Three times a day (TID) | ORAL | 1 refills | Status: DC | PRN

## 2022-07-25 ENCOUNTER — Ambulatory Visit: Payer: Medicaid Other

## 2022-07-28 ENCOUNTER — Ambulatory Visit: Payer: Medicaid Other | Attending: Obstetrics and Gynecology

## 2022-07-28 ENCOUNTER — Other Ambulatory Visit: Payer: Self-pay | Admitting: *Deleted

## 2022-07-28 ENCOUNTER — Ambulatory Visit: Payer: Medicaid Other | Admitting: *Deleted

## 2022-07-28 VITALS — BP 121/63 | HR 90

## 2022-07-28 DIAGNOSIS — Z3A26 26 weeks gestation of pregnancy: Secondary | ICD-10-CM

## 2022-07-28 DIAGNOSIS — Z362 Encounter for other antenatal screening follow-up: Secondary | ICD-10-CM

## 2022-07-28 DIAGNOSIS — O09893 Supervision of other high risk pregnancies, third trimester: Secondary | ICD-10-CM

## 2022-07-28 DIAGNOSIS — D563 Thalassemia minor: Secondary | ICD-10-CM

## 2022-07-28 DIAGNOSIS — O09212 Supervision of pregnancy with history of pre-term labor, second trimester: Secondary | ICD-10-CM | POA: Insufficient documentation

## 2022-07-28 DIAGNOSIS — O0942 Supervision of pregnancy with grand multiparity, second trimester: Secondary | ICD-10-CM | POA: Diagnosis not present

## 2022-07-28 DIAGNOSIS — Z3689 Encounter for other specified antenatal screening: Secondary | ICD-10-CM

## 2022-08-25 ENCOUNTER — Other Ambulatory Visit: Payer: Self-pay | Admitting: Obstetrics and Gynecology

## 2022-08-25 DIAGNOSIS — O219 Vomiting of pregnancy, unspecified: Secondary | ICD-10-CM

## 2022-08-26 ENCOUNTER — Encounter: Payer: Medicaid Other | Admitting: Medical

## 2022-08-27 ENCOUNTER — Telehealth: Payer: Self-pay

## 2022-09-01 MED ORDER — ONDANSETRON HCL 8 MG PO TABS: 8.0000 mg | ORAL_TABLET | Freq: Three times a day (TID) | ORAL | 0 refills | Status: DC | PRN

## 2022-09-04 ENCOUNTER — Encounter: Payer: Self-pay | Admitting: *Deleted

## 2022-09-08 ENCOUNTER — Ambulatory Visit: Payer: Medicaid Other | Admitting: *Deleted

## 2022-09-08 ENCOUNTER — Encounter: Payer: Self-pay | Admitting: *Deleted

## 2022-09-08 ENCOUNTER — Ambulatory Visit: Payer: Medicaid Other | Attending: Obstetrics and Gynecology

## 2022-09-08 VITALS — BP 109/67 | HR 107

## 2022-09-08 DIAGNOSIS — Z3A32 32 weeks gestation of pregnancy: Secondary | ICD-10-CM | POA: Diagnosis not present

## 2022-09-08 DIAGNOSIS — D563 Thalassemia minor: Secondary | ICD-10-CM | POA: Diagnosis not present

## 2022-09-08 DIAGNOSIS — O09213 Supervision of pregnancy with history of pre-term labor, third trimester: Secondary | ICD-10-CM | POA: Diagnosis not present

## 2022-09-08 DIAGNOSIS — Z148 Genetic carrier of other disease: Secondary | ICD-10-CM | POA: Insufficient documentation

## 2022-09-08 DIAGNOSIS — O09893 Supervision of other high risk pregnancies, third trimester: Secondary | ICD-10-CM | POA: Insufficient documentation

## 2022-09-08 DIAGNOSIS — O285 Abnormal chromosomal and genetic finding on antenatal screening of mother: Secondary | ICD-10-CM | POA: Diagnosis not present

## 2022-09-08 DIAGNOSIS — Z3480 Encounter for supervision of other normal pregnancy, unspecified trimester: Secondary | ICD-10-CM

## 2022-09-08 DIAGNOSIS — O0943 Supervision of pregnancy with grand multiparity, third trimester: Secondary | ICD-10-CM

## 2022-09-08 DIAGNOSIS — Z362 Encounter for other antenatal screening follow-up: Secondary | ICD-10-CM | POA: Insufficient documentation

## 2022-09-08 DIAGNOSIS — Z3689 Encounter for other specified antenatal screening: Secondary | ICD-10-CM

## 2022-09-09 ENCOUNTER — Other Ambulatory Visit: Payer: Self-pay | Admitting: *Deleted

## 2022-09-09 ENCOUNTER — Encounter: Payer: Medicaid Other | Admitting: Obstetrics

## 2022-09-09 ENCOUNTER — Encounter: Payer: Medicaid Other | Admitting: Advanced Practice Midwife

## 2022-09-09 DIAGNOSIS — D563 Thalassemia minor: Secondary | ICD-10-CM

## 2022-09-09 DIAGNOSIS — O09899 Supervision of other high risk pregnancies, unspecified trimester: Secondary | ICD-10-CM

## 2022-09-09 DIAGNOSIS — Z3689 Encounter for other specified antenatal screening: Secondary | ICD-10-CM

## 2022-09-09 DIAGNOSIS — D571 Sickle-cell disease without crisis: Secondary | ICD-10-CM

## 2022-09-09 DIAGNOSIS — Z8759 Personal history of other complications of pregnancy, childbirth and the puerperium: Secondary | ICD-10-CM

## 2022-09-10 ENCOUNTER — Inpatient Hospital Stay (HOSPITAL_COMMUNITY)
Admission: AD | Admit: 2022-09-10 | Discharge: 2022-09-10 | Disposition: A | Discharge status: Home / Self Care | Payer: Medicaid Other | Attending: Obstetrics and Gynecology | Admitting: Obstetrics and Gynecology

## 2022-09-10 ENCOUNTER — Encounter (HOSPITAL_COMMUNITY): Payer: Self-pay | Admitting: Obstetrics and Gynecology

## 2022-09-10 DIAGNOSIS — Z3480 Encounter for supervision of other normal pregnancy, unspecified trimester: Secondary | ICD-10-CM

## 2022-09-10 DIAGNOSIS — Z8759 Personal history of other complications of pregnancy, childbirth and the puerperium: Secondary | ICD-10-CM | POA: Insufficient documentation

## 2022-09-10 DIAGNOSIS — O09293 Supervision of pregnancy with other poor reproductive or obstetric history, third trimester: Secondary | ICD-10-CM | POA: Diagnosis not present

## 2022-09-10 DIAGNOSIS — Z3A33 33 weeks gestation of pregnancy: Secondary | ICD-10-CM | POA: Insufficient documentation

## 2022-09-10 DIAGNOSIS — O479 False labor, unspecified: Secondary | ICD-10-CM | POA: Diagnosis not present

## 2022-09-10 DIAGNOSIS — O09213 Supervision of pregnancy with history of pre-term labor, third trimester: Secondary | ICD-10-CM | POA: Insufficient documentation

## 2022-09-10 DIAGNOSIS — O4703 False labor before 37 completed weeks of gestation, third trimester: Secondary | ICD-10-CM

## 2022-09-10 MED ORDER — LACTATED RINGERS IV BOLUS
1000.0000 mL | Freq: Once | INTRAVENOUS | Status: AC
  Administered 2022-09-10: 1000 mL via INTRAVENOUS

## 2022-09-10 MED ORDER — ONDANSETRON 8 MG PO TBDP: 8.0000 mg | ORAL_TABLET | Freq: Three times a day (TID) | ORAL | 1 refills | Status: DC | PRN

## 2022-09-10 MED ORDER — SODIUM CHLORIDE 0.9 % IV SOLN
8.0000 mg | Freq: Once | INTRAVENOUS | Status: AC
  Administered 2022-09-10: 8 mg via INTRAVENOUS
  Filled 2022-09-10: qty 4

## 2022-09-10 MED ORDER — COMPLETENATE 29-1 MG PO CHEW: 1.0000 | CHEWABLE_TABLET | Freq: Every day | ORAL | 11 refills | Status: DC

## 2022-09-10 NOTE — MAU Note (Signed)
.  Amanda Davies is a 22 y.o. at [redacted]w[redacted]d here in MAU reporting: ctx that started yesterday, today they intensified. Patient states she has a short cervix with this pregnancy. Denies VB or LOF. +FM, Denies recent IC.   Pain score: 0 Vitals:   09/10/22 1435  BP: 101/61  Pulse: (!) 109  Resp: 14  Temp: 97.8 F (36.6 C)  SpO2: 100%     FHT:136 Lab orders placed from triage:  UA

## 2022-09-10 NOTE — MAU Provider Note (Signed)
History     CSN: [id number]  Arrival date and time: 09/10/22 1424   Event Date/Time   First Provider Initiated Contact with Patient 09/10/22 1454      Chief Complaint  Patient presents with   Contractions   Amanda Davies is a 22 y.o. [id number] at [redacted]w[redacted]d who presents today with contractions. She states that she has had contractions since Monday and they seem to be stronger today. She denies any VB or  LOF. She denies intercourse in the last 24 hours. She reports normal fetal movement. She knows she has missed a lot of her OB appts. She has had some scheduling conflicts. She is following with [place] in [facility] due to history of IUGR. She also has a history of preterm birth, and has had 2 deliveries at 35 weeks.     OB History     Gravida  7   Para  5   Term  3   Preterm  2   AB  1   Living  5      SAB  1   IAB      Ectopic      Multiple  0   Live Births  5           Past Medical History:  Diagnosis Date   Acne    Asthma    when younger   Chlamydia    Encounter for vaginal delivery 12/13/2020   GBS bacteriuria 04/25/2019   Hx of pre-term labor    Insufficient prenatal care in third trimester 07/21/2019   PROM (premature rupture of membranes) 09/10/2019   Pyelonephritis affecting pregnancy 03/05/2019   Supervision of high risk pregnancy, antepartum 11/27/2016          Nursing Staff  Provider  Office Location   [facility]  Dating   Unsure LMP  Language   English  Anatomy US   ?growth restriction?, Dopplers 3.12, anatomy normal, f/u 3 weeks  Flu Vaccine   declined  Genetic Screen   NIPS: low risk   AFP:   Not done      TDaP vaccine    declined  Hgb A1C or   GTT  Early   Third trimester   Rhogam    NA     LAB RESULTS   Feeding Plan   Breast  Blood Type  O/Posit   Supervision of normal pregnancy, antepartum 06/26/2020          Nursing Staff  Provider  Office Location   [facility]   Dating      Language   English   Anatomy US      Flu Vaccine   No   Genetic Screen   NIPS:   AFP:    First Screen:  Quad:      TDaP Vaccine    Declined   Hgb A1C or   GTT  Early   Third trimester   COVID Vaccine   No     LAB RESULTS   Rhogam      Blood Type  O/Positive/-- (03/15 1523)   Feeding Plan  Both   Antibody  Negative (03/15 1523)      Past Surgical History:  Procedure Laterality Date   NO PAST SURGERIES      Family History  Problem Relation Age of Onset   Anxiety disorder Mother    Asthma Sister    Asthma Brother     Social History   Tobacco Use   Smoking status: Never  Smokeless tobacco: Never  Vaping Use   Vaping Use: Never used  Substance Use Topics   Alcohol use: No   Drug use: No    Allergies: No Known Allergies  Medications Prior to Admission  Medication Sig Dispense Refill Last Dose   ondansetron (ZOFRAN) 8 MG tablet Take 1 tablet (8 mg total) by mouth every 8 (eight) hours as needed for nausea or vomiting. 30 tablet 0 09/09/2022   Blood Pressure Monitoring (BLOOD PRESSURE KIT) DEVI 1 kit by Does not apply route once a week. 1 each 0    cyclobenzaprine (FLEXERIL) 10 MG tablet Take 1 tablet (10 mg total) by mouth 3 (three) times daily as needed for muscle spasms. (Patient not taking: Reported on 06/05/2022) 30 tablet 0    ferrous sulfate 325 (65 FE) MG tablet Take 1 tablet (325 mg total) by mouth every other day. 30 tablet 0    Misc. Devices (GOJJI WEIGHT SCALE) MISC 1 Device by Does not apply route every 30 (thirty) days. 1 each 0    ondansetron (ZOFRAN-ODT) 8 MG disintegrating tablet Take 1 tablet (8 mg total) by mouth every 8 (eight) hours as needed for nausea. 24 tablet 1    Prenatal Vit-Fe Phos-FA-Omega (VITAFOL GUMMIES) 3.33-0.333-34.8 MG CHEW Chew 3 tablets by mouth daily. (Patient not taking: Reported on 04/03/2022) 90 tablet 11    Prenatal Vit-Fe Phos-FA-Omega (VITAFOL GUMMIES) 3.33-0.333-34.8 MG CHEW Chew 3 tablets by mouth daily. (Patient not taking: Reported on 07/28/2022) 90 tablet 11    terconazole (TERAZOL 3) 0.8 % vaginal cream Place 1 applicator  vaginally at bedtime. (Patient not taking: Reported on 06/27/2022) 20 g 0    terconazole (TERAZOL 7) 0.4 % vaginal cream Place 1 applicator vaginally at bedtime. (Patient not taking: Reported on 06/27/2022) 45 g 0     Review of Systems  All other systems reviewed and are negative.  Physical Exam   Blood pressure 101/61, pulse (!) 109, temperature 97.8 F (36.6 C), temperature source Oral, resp. rate 14, last menstrual period 01/21/2022, SpO2 100 %, unknown if currently breastfeeding.  Physical Exam Constitutional:      Appearance: She is well-developed.  HENT:     Head: Normocephalic.  Eyes:     Pupils: Pupils are equal, round, and reactive to light.  Cardiovascular:     Rate and Rhythm: Normal rate and regular rhythm.     Heart sounds: Normal heart sounds.  Pulmonary:     Effort: Pulmonary effort is normal. No respiratory distress.     Breath sounds: Normal breath sounds.  Abdominal:     Palpations: Abdomen is soft.     Tenderness: There is no abdominal tenderness.  Genitourinary:    Vagina: No bleeding. Vaginal discharge: mucusy.    Comments: External: no lesion Vagina: small amount of white discharge Dilation: Closed Exam by:: Thressa Sheller, CNM   Musculoskeletal:        General: Normal range of motion.     Cervical back: Normal range of motion and neck supple.  Skin:    General: Skin is warm and dry.  Neurological:     Mental Status: She is alert and oriented to person, place, and time.  Psychiatric:        Mood and Affect: Mood normal.        Behavior: Behavior normal.    NST:  Baseline: 125 Variability: moderate Accels: 15x15 Decels: none Toco: occasional  Reactive/Appropriate for GA    MAU Course  Procedures  MDM Patient had  occasional contractions on arrival. Cervix was closed. She was given 1L LR bolus and by the end of her visit she was no longer contracting and was very comfortable.   GBS collected today due to history of preterm delivery and  sporadic prenatal care.   Assessment and Plan   1. Supervision of other normal pregnancy, antepartum   2. Threatened premature labor in third trimester   3. [redacted] weeks gestation of pregnancy    DC home in stable condition  3rd Trimester precautions  PTL precautions  Fetal kick counts RX: none  Return to MAU as needed FU with OB as planned   Follow-up Information     [facility]. Schedule an appointment as soon as possible for a visit.   Contact information: [address] [place] [address] [phone]               Thressa Sheller DNP, CNM  09/10/22  4:34 PM

## 2022-09-11 LAB — GC/CHLAMYDIA PROBE AMP (~~LOC~~) NOT AT ARMC
Chlamydia: NEGATIVE
Comment: NEGATIVE
Comment: NORMAL
Neisseria Gonorrhea: NEGATIVE

## 2022-09-14 LAB — CULTURE, BETA STREP (GROUP B ONLY)

## 2022-09-16 ENCOUNTER — Ambulatory Visit: Payer: Medicaid Other | Admitting: Certified Nurse Midwife

## 2022-09-16 DIAGNOSIS — O09899 Supervision of other high risk pregnancies, unspecified trimester: Secondary | ICD-10-CM

## 2022-09-16 DIAGNOSIS — O0990 Supervision of high risk pregnancy, unspecified, unspecified trimester: Secondary | ICD-10-CM

## 2022-09-16 DIAGNOSIS — Z8759 Personal history of other complications of pregnancy, childbirth and the puerperium: Secondary | ICD-10-CM

## 2022-09-16 DIAGNOSIS — Z3A34 34 weeks gestation of pregnancy: Secondary | ICD-10-CM

## 2022-09-16 DIAGNOSIS — O0933 Supervision of pregnancy with insufficient antenatal care, third trimester: Secondary | ICD-10-CM

## 2022-09-16 NOTE — Progress Notes (Signed)
Patient did not show for visit today.   Amanda Davies) Amanda Portela, MSN, CNM  Center for [facility]  09/16/22 4:10 PM

## 2022-09-22 ENCOUNTER — Encounter (HOSPITAL_COMMUNITY): Payer: Self-pay | Admitting: Obstetrics and Gynecology

## 2022-09-22 ENCOUNTER — Inpatient Hospital Stay (HOSPITAL_COMMUNITY)
Admission: AD | Admit: 2022-09-22 | Discharge: 2022-09-22 | Disposition: A | Discharge status: Home / Self Care | Payer: Medicaid Other | Attending: Obstetrics and Gynecology | Admitting: Obstetrics and Gynecology

## 2022-09-22 DIAGNOSIS — R197 Diarrhea, unspecified: Secondary | ICD-10-CM | POA: Diagnosis not present

## 2022-09-22 DIAGNOSIS — O99891 Other specified diseases and conditions complicating pregnancy: Secondary | ICD-10-CM | POA: Insufficient documentation

## 2022-09-22 DIAGNOSIS — O99283 Endocrine, nutritional and metabolic diseases complicating pregnancy, third trimester: Secondary | ICD-10-CM | POA: Insufficient documentation

## 2022-09-22 DIAGNOSIS — O219 Vomiting of pregnancy, unspecified: Secondary | ICD-10-CM | POA: Insufficient documentation

## 2022-09-22 DIAGNOSIS — E876 Hypokalemia: Secondary | ICD-10-CM | POA: Diagnosis not present

## 2022-09-22 DIAGNOSIS — R100 Acute abdomen: Secondary | ICD-10-CM | POA: Diagnosis not present

## 2022-09-22 DIAGNOSIS — O0990 Supervision of high risk pregnancy, unspecified, unspecified trimester: Secondary | ICD-10-CM | POA: Diagnosis not present

## 2022-09-22 DIAGNOSIS — R1084 Generalized abdominal pain: Secondary | ICD-10-CM | POA: Diagnosis not present

## 2022-09-22 DIAGNOSIS — O98513 Other viral diseases complicating pregnancy, third trimester: Secondary | ICD-10-CM | POA: Insufficient documentation

## 2022-09-22 DIAGNOSIS — E860 Dehydration: Secondary | ICD-10-CM | POA: Diagnosis not present

## 2022-09-22 DIAGNOSIS — Z3A34 34 weeks gestation of pregnancy: Secondary | ICD-10-CM | POA: Diagnosis not present

## 2022-09-22 DIAGNOSIS — I959 Hypotension, unspecified: Secondary | ICD-10-CM | POA: Diagnosis not present

## 2022-09-22 DIAGNOSIS — G4489 Other headache syndrome: Secondary | ICD-10-CM | POA: Diagnosis not present

## 2022-09-22 LAB — COMPREHENSIVE METABOLIC PANEL
ALT: 9 U/L (ref 0–44)
AST: 13 U/L — ABNORMAL LOW (ref 15–41)
Albumin: 2.6 g/dL — ABNORMAL LOW (ref 3.5–5.0)
Alkaline Phosphatase: 185 U/L — ABNORMAL HIGH (ref 38–126)
Anion gap: 9 (ref 5–15)
BUN: <5 mg/dL — ABNORMAL LOW (ref 6–20)
CO2: 22 mmol/L (ref 22–32)
Calcium: 8.3 mg/dL — ABNORMAL LOW (ref 8.9–10.3)
Chloride: 105 mmol/L (ref 98–111)
Creatinine, Ser: 0.6 mg/dL (ref 0.44–1.00)
GFR, Estimated: >60 mL/min (ref >60)
Glucose, Bld: 76 mg/dL (ref 70–99)
Potassium: 3 mmol/L — ABNORMAL LOW (ref 3.5–5.1)
Sodium: 136 mmol/L (ref 135–145)
Total Bilirubin: 1 mg/dL (ref 0.3–1.2)
Total Protein: 6 g/dL — ABNORMAL LOW (ref 6.5–8.1)

## 2022-09-22 LAB — URINALYSIS, ROUTINE W REFLEX MICROSCOPIC
Bilirubin Urine: NEGATIVE
Glucose, UA: NEGATIVE mg/dL
Hgb urine dipstick: NEGATIVE
Ketones, ur: NEGATIVE mg/dL
Nitrite: NEGATIVE
Protein, ur: NEGATIVE mg/dL
Specific Gravity, Urine: 1.003 — ABNORMAL LOW (ref 1.005–1.030)
pH: 7 (ref 5.0–8.0)

## 2022-09-22 LAB — CBC WITH DIFFERENTIAL/PLATELET
Abs Immature Granulocytes: 0.06 10*3/uL (ref 0.00–0.07)
Basophils Absolute: 0 10*3/uL (ref 0.0–0.1)
Basophils Relative: 0 %
Eosinophils Absolute: 0.1 10*3/uL (ref 0.0–0.5)
Eosinophils Relative: 1 %
HCT: 36.7 % (ref 36.0–46.0)
Hemoglobin: 11.5 g/dL — ABNORMAL LOW (ref 12.0–15.0)
Immature Granulocytes: 1 %
Lymphocytes Relative: 29 %
Lymphs Abs: 2.3 10*3/uL (ref 0.7–4.0)
MCH: 23.4 pg — ABNORMAL LOW (ref 26.0–34.0)
MCHC: 31.3 g/dL (ref 30.0–36.0)
MCV: 74.6 fL — ABNORMAL LOW (ref 80.0–100.0)
Monocytes Absolute: 0.7 10*3/uL (ref 0.1–1.0)
Monocytes Relative: 9 %
Neutro Abs: 4.6 10*3/uL (ref 1.7–7.7)
Neutrophils Relative %: 60 %
Platelets: 233 10*3/uL (ref 150–400)
RBC: 4.92 MIL/uL (ref 3.87–5.11)
RDW: 14.5 % (ref 11.5–15.5)
WBC: 7.8 10*3/uL (ref 4.0–10.5)
nRBC: 0 % (ref 0.0–0.2)

## 2022-09-22 MED ORDER — ONDANSETRON HCL 8 MG PO TABS
8.0000 mg | ORAL_TABLET | Freq: Three times a day (TID) | ORAL | 2 refills | Status: DC | PRN
Start: 2022-09-22 — End: 2022-10-17

## 2022-09-22 MED ORDER — LACTATED RINGERS IV BOLUS
1000.0000 mL | Freq: Once | INTRAVENOUS | Status: AC
  Administered 2022-09-22: 1000 mL via INTRAVENOUS

## 2022-09-22 MED ORDER — LOPERAMIDE HCL 2 MG PO CAPS
4.0000 mg | ORAL_CAPSULE | Freq: Once | ORAL | Status: AC
  Administered 2022-09-22: 4 mg via ORAL
  Filled 2022-09-22: qty 2

## 2022-09-22 MED ORDER — ACETAMINOPHEN 500 MG PO TABS
1000.0000 mg | ORAL_TABLET | Freq: Once | ORAL | Status: AC
  Administered 2022-09-22: 1000 mg via ORAL
  Filled 2022-09-22: qty 2

## 2022-09-22 MED ORDER — ONDANSETRON 4 MG PO TBDP
8.0000 mg | ORAL_TABLET | Freq: Once | ORAL | Status: AC
  Administered 2022-09-22: 8 mg via ORAL
  Filled 2022-09-22: qty 2

## 2022-09-22 MED ORDER — POTASSIUM CHLORIDE CRYS ER 20 MEQ PO TBCR: 20.0000 meq | EXTENDED_RELEASE_TABLET | Freq: Two times a day (BID) | ORAL | 0 refills | Status: DC

## 2022-09-22 MED ORDER — CYCLOBENZAPRINE HCL 5 MG PO TABS
10.0000 mg | ORAL_TABLET | Freq: Once | ORAL | Status: AC
  Administered 2022-09-22: 10 mg via ORAL
  Filled 2022-09-22: qty 2

## 2022-09-22 NOTE — Discharge Instructions (Signed)

## 2022-09-22 NOTE — MAU Provider Note (Cosign Needed Addendum)
History     CSN: [id number]  Arrival date and time: 09/22/22 1810   Event Date/Time   First Provider Initiated Contact with Patient 09/22/22 1941      Chief Complaint  Patient presents with   Diarrhea   Amanda Davies is a 22 yo [id number] @[redacted]w[redacted]d  presenting for diarrhea, abdominal pain and low back pain via EMS. Her diarrhea began 09/20/22 at 10:30pm. She has had 7-8 episodes of small volume, non-bloody watery diarrhea that resolved yesterday. Today at 5:30pm she was able to keep a meal from [employer] down. Her abdominal pain is diffuse and began 09/20/22 at 10:30pm and has been intermittent and crampy with the highest severity 7/10. Her abdominal pain does not radiate. Nothing has improved her abdominal pain. Laying on her side is most comfortable. Since the onset of diarrhea she has had 4 episodes of non-billious, non-bloody vomit which she attributes to not taking her Zofran. Her back pain began on 5/18 and is localized to her right middle and right lower back. She describes it as crampy and rates it a 8/10. Her back pain does not radiate. Nothing worsens her pain, but it is improved by heat. Her leg pain began today as a muscle cramp localized to the hamstring region which she rates a 5/10. Her leg gave out when walking to the stretcher and she described it as feeling numb when that happened. Her leg pain does not radiate. Her pain is aggravated by walking. The numbness has resolved.   Patient is concerned about bilateral lower extremity edema. On exam, there is trace bilateral lower extremity edema.   She has had no sick contacts and no recent travel.     OB History     Gravida  7   Para  5   Term  3   Preterm  2   AB  1   Living  5      SAB  1   IAB      Ectopic      Multiple  0   Live Births  5           Past Medical History:  Diagnosis Date   Acne    Asthma    when younger   Chlamydia    Encounter for vaginal delivery 12/13/2020   GBS bacteriuria 04/25/2019    Hx of pre-term labor    Insufficient prenatal care in third trimester 07/21/2019   PROM (premature rupture of membranes) 09/10/2019   Supervision of high risk pregnancy, antepartum 11/27/2016          Nursing Staff  Provider  Office Location   [facility]  Dating   Unsure LMP  Language   English  Anatomy US   ?growth restriction?, Dopplers 3.12, anatomy normal, f/u 3 weeks  Flu Vaccine   declined  Genetic Screen   NIPS: low risk   AFP:   Not done      TDaP vaccine    declined  Hgb A1C or   GTT  Early   Third trimester   Rhogam    NA     LAB RESULTS   Feeding Plan   Breast  Blood Type  O/Posit   Supervision of normal pregnancy, antepartum 06/26/2020          Nursing Staff  Provider  Office Location   [facility]   Dating      Language   English   Anatomy US      Flu Vaccine   No  Genetic Screen   NIPS:   AFP:   First Screen:  Quad:      TDaP Vaccine    Declined   Hgb A1C or   GTT  Early   Third trimester   COVID Vaccine   No     LAB RESULTS   Rhogam      Blood Type  O/Positive/-- (03/15 1523)   Feeding Plan  Both   Antibody  Negative (03/15 1523)      Past Surgical History:  Procedure Laterality Date   NO PAST SURGERIES      Family History  Problem Relation Age of Onset   Anxiety disorder Mother    Asthma Sister    Asthma Brother     Social History   Tobacco Use   Smoking status: Never   Smokeless tobacco: Never  Vaping Use   Vaping Use: Never used  Substance Use Topics   Alcohol use: No   Drug use: No    Allergies: No Known Allergies  No medications prior to admission.    Review of Systems  Constitutional:  Negative for fever.  Respiratory:  Negative for shortness of breath.   Cardiovascular:  Negative for leg swelling.  Gastrointestinal:  Positive for abdominal pain, diarrhea, nausea and vomiting. Negative for abdominal distention and blood in stool.  Genitourinary:  Positive for flank pain, frequency and urgency. Negative for dysuria, hematuria, vaginal bleeding and vaginal  discharge.  Musculoskeletal:  Positive for back pain.  Neurological:  Negative for headaches.    Physical Exam   Blood pressure (!) 110/52, pulse 99, temperature 98.4 F (36.9 C), resp. rate 16, height 5\' 2"  (1.575 m), weight 67.1 kg, last menstrual period 01/21/2022, SpO2 100 %, unknown if currently breastfeeding.  Physical Exam Constitutional:      General: She is not in acute distress.    Appearance: She is not ill-appearing.  HENT:     Head: Normocephalic.  Eyes:     Extraocular Movements: Extraocular movements intact.  Cardiovascular:     Rate and Rhythm: Normal rate.  Pulmonary:     Effort: Pulmonary effort is normal. No respiratory distress.     Breath sounds: Normal breath sounds.  Abdominal:     Palpations: Abdomen is soft.     Tenderness: There is abdominal tenderness. There is no right CVA tenderness, left CVA tenderness, guarding or rebound.     Comments: Diffuse abdominal tenderness.   Musculoskeletal:        General: Tenderness present.     Right lower leg: Edema present.     Left lower leg: Edema present.     Comments: Mid-lower back tenderness on right side. Mid back tenderness on left side. Trace bilateral lower extremity edema.   Skin:    General: Skin is dry.     Capillary Refill: Capillary refill takes 2 to 3 seconds.  Neurological:     General: No focal deficit present.     Mental Status: She is alert.     Comments: Normal tone and strength in both lower extremities   Psychiatric:        Mood and Affect: Mood normal.        Behavior: Behavior normal.    EFM: 140 bpm, moderate variability, 15x15 accels, no decels Toco: occasional contractions  Results for orders placed or performed during the hospital encounter of 09/22/22 (from the past 24 hour(s))  Urinalysis, Routine w reflex microscopic -Urine, Clean Catch     Status: Abnormal  Collection Time: 09/22/22  7:23 PM  Result Value Ref Range   Color, Urine YELLOW YELLOW   APPearance CLEAR CLEAR    Specific Gravity, Urine 1.003 (L) 1.005 - 1.030   pH 7.0 5.0 - 8.0   Glucose, UA NEGATIVE NEGATIVE mg/dL   Hgb urine dipstick NEGATIVE NEGATIVE   Bilirubin Urine NEGATIVE NEGATIVE   Ketones, ur NEGATIVE NEGATIVE mg/dL   Protein, ur NEGATIVE NEGATIVE mg/dL   Nitrite NEGATIVE NEGATIVE   Leukocytes,Ua LARGE (A) NEGATIVE   RBC / HPF 0-5 0 - 5 RBC/hpf   WBC, UA 11-20 0 - 5 WBC/hpf   Bacteria, UA FEW (A) NONE SEEN   Squamous Epithelial / HPF 0-5 0 - 5 /HPF  CBC with Differential/Platelet     Status: Abnormal   Collection Time: 09/22/22  8:05 PM  Result Value Ref Range   WBC 7.8 4.0 - 10.5 K/uL   RBC 4.92 3.87 - 5.11 MIL/uL   Hemoglobin 11.5 (L) 12.0 - 15.0 g/dL   HCT 45.4 09.8 - 11.9 %   MCV 74.6 (L) 80.0 - 100.0 fL   MCH 23.4 (L) 26.0 - 34.0 pg   MCHC 31.3 30.0 - 36.0 g/dL   RDW 14.7 82.9 - 56.2 %   Platelets 233 150 - 400 K/uL   nRBC 0.0 0.0 - 0.2 %   Neutrophils Relative % 60 %   Neutro Abs 4.6 1.7 - 7.7 K/uL   Lymphocytes Relative 29 %   Lymphs Abs 2.3 0.7 - 4.0 K/uL   Monocytes Relative 9 %   Monocytes Absolute 0.7 0.1 - 1.0 K/uL   Eosinophils Relative 1 %   Eosinophils Absolute 0.1 0.0 - 0.5 K/uL   Basophils Relative 0 %   Basophils Absolute 0.0 0.0 - 0.1 K/uL   Immature Granulocytes 1 %   Abs Immature Granulocytes 0.06 0.00 - 0.07 K/uL  Comprehensive metabolic panel     Status: Abnormal   Collection Time: 09/22/22  8:05 PM  Result Value Ref Range   Sodium 136 135 - 145 mmol/L   Potassium 3.0 (L) 3.5 - 5.1 mmol/L   Chloride 105 98 - 111 mmol/L   CO2 22 22 - 32 mmol/L   Glucose, Bld 76 70 - 99 mg/dL   BUN <5 (L) 6 - 20 mg/dL   Creatinine, Ser 1.30 0.44 - 1.00 mg/dL   Calcium 8.3 (L) 8.9 - 10.3 mg/dL   Total Protein 6.0 (L) 6.5 - 8.1 g/dL   Albumin 2.6 (L) 3.5 - 5.0 g/dL   AST 13 (L) 15 - 41 U/L   ALT 9 0 - 44 U/L   Alkaline Phosphatase 185 (H) 38 - 126 U/L   Total Bilirubin 1.0 0.3 - 1.2 mg/dL   GFR, Estimated >86 >57 mL/min   Anion gap 9 5 - 15      MAU  Course  Procedures  MDM   Amanda Davies is a 22 yo [id number] @[redacted]w[redacted]d  who presented for diarrhea, abdominal pain and lower back pain via EMS. She is overall well-appearing, but uncomfortable and her vital signs have been stable. Discussed and provided comfort measures. Low suspicion for pyelonephritis, UTI, kidney stones, or preterm labor.   #UA: Large amounts of leukocytes, few bacteria present and 11-20 WBC/hpf #Ucx:  #CBC:  #CMP:  #LR 1,044mL IV bolus  #Zofran 8mg  PO #Loperamide 4mg  PO #Flexeril 4mg  PO #Acetaminophen 1,000mg  PO    Assessment and Plan   Dehydration secondary to viral GI illness  #Disposition: Discharge to home.  #  Follow-up: Follow-up with regular OB provider as scheduled  #PTL precautions, return if worsening symptoms  Camille Bal  Attestation of Supervision of Student:  I confirm that I have verified the information documented in the medical student's note and that I have also personally reperformed the history, physical exam and all medical decision making activities.  I have verified that all services and findings are accurately documented in this student's note; and I agree with management and plan as outlined in the documentation. I have also made any necessary editorial changes.  Patient reports resolution of pain. No episodes of diarrhea in MAU.   1. Diarrhea, unspecified type   2. [redacted] weeks gestation of pregnancy   3. Hypokalemia   4. Nausea and vomiting in pregnancy    -Discharge home in stable condition -Rx for kdur and zofran sent to pharmacy -Third trimester precautions discussed -Patient advised to follow-up with OB as scheduled for prenatal care -Patient may return to MAU as needed or if her condition were to change or worsen   Rolm Bookbinder, CNM [facility] for [employer], [facility] 09/22/2022 11:10 PM

## 2022-09-22 NOTE — MAU Note (Addendum)
..  Amanda Davies is a 22 y.o. at [redacted]w[redacted]d here via EMS in MAU reporting: of diarrhea more than 5 times since yesterday. Her feet were swollen and numb but not as at now. she has also had  , lower back and abdominal  pain since 09/20/2022. Reports  +FM, no VB or leakage of AF has not any medication for the symptoms. Onset of complaint: 09/21/2022  Pain score: 6 Vitals:   09/22/22 1840  BP: 98/68  Pulse: 97  Temp: 98.4 F (36.9 C)  SpO2: 100%     FHT:140

## 2022-09-23 LAB — CULTURE, OB URINE: Culture: NO GROWTH

## 2022-09-24 ENCOUNTER — Encounter: Payer: Medicaid Other | Admitting: Obstetrics

## 2022-10-08 ENCOUNTER — Ambulatory Visit: Payer: Medicaid Other | Attending: Maternal & Fetal Medicine

## 2022-10-09 MED FILL — Lactated Ringer's Solution: INTRAVENOUS | Qty: 1000 | Status: AC

## 2022-10-12 ENCOUNTER — Inpatient Hospital Stay (HOSPITAL_COMMUNITY)
Admission: AD | Admit: 2022-10-12 | Discharge: 2022-10-12 | Disposition: A | Discharge status: Home / Self Care | Payer: Medicaid Other | Attending: Obstetrics and Gynecology | Admitting: Obstetrics and Gynecology

## 2022-10-12 DIAGNOSIS — Z3A37 37 weeks gestation of pregnancy: Secondary | ICD-10-CM | POA: Diagnosis not present

## 2022-10-12 DIAGNOSIS — Z0371 Encounter for suspected problem with amniotic cavity and membrane ruled out: Secondary | ICD-10-CM

## 2022-10-12 DIAGNOSIS — O039 Complete or unspecified spontaneous abortion without complication: Secondary | ICD-10-CM | POA: Diagnosis not present

## 2022-10-12 DIAGNOSIS — O26893 Other specified pregnancy related conditions, third trimester: Secondary | ICD-10-CM | POA: Insufficient documentation

## 2022-10-12 DIAGNOSIS — O479 False labor, unspecified: Secondary | ICD-10-CM | POA: Diagnosis not present

## 2022-10-12 LAB — AMNISURE RUPTURE OF MEMBRANE (ROM) NOT AT ARMC: Amnisure ROM: NEGATIVE

## 2022-10-12 NOTE — MAU Note (Addendum)
Amanda Davies is a 22 y.o. at [redacted]w[redacted]d here in MAU reporting: arrived via ems with ctx and LOF that started at 1800. States she has been contracting for the last week.  Reports watery clear fluid. Denies any vaginal pain, itching or odor. Last sexual intercourse a few months ago. Back pain that comes and goes. Endorses +FM No PNC since February due to childcare.  Onset of complaint: 1800 Pain score: no pain Vitals:   10/12/22 1858  BP: 124/74  Pulse: 85  Resp: 17  Temp: 98.6 F (37 C)     FHT:145 Lab orders placed from triage:  Crist Fat

## 2022-10-12 NOTE — MAU Provider Note (Signed)
S: Ms. Niambi Smoak is a 22 y.o. [phone] at [redacted]w[redacted]d  who presents to MAU via EMS today complaining of leaking of fluid since 1800. She denies vaginal bleeding. She endorses contractions. She reports normal fetal movement.    O: BP 124/74   Pulse 85   Temp 98.6 F (37 C) (Oral)   Resp 17   LMP 01/21/2022 (Approximate)  GENERAL: Well-developed, well-nourished female in no acute distress.  HEAD: Normocephalic, atraumatic.  CHEST: Normal effort of breathing, regular heart rate ABDOMEN: Soft, nontender, gravid PELVIC: Fern slide and ROM+ collected by RN  Cervical exam:  Dilation: 4.5 Effacement (%): 60 Cervical Position: Posterior Station: -3 Presentation: Vertex Exam by:: K. Cowher RN   Fetal Monitoring: Baseline: 140 Variability: moderate Accelerations: present Decelerations: absent Contractions: regular every 2-4 mins  Results for orders placed or performed during the hospital encounter of 10/12/22 (from the past 24 hour(s))  Amnisure rupture of membrane (rom)not at [facility]     Status: None   Collection Time: 10/12/22  8:15 PM  Result Value Ref Range   Amnisure ROM NEGATIVE    A: SIUP at [redacted]w[redacted]d  Membranes intact  P: 1. No leakage of amniotic fluid into vagina - Discharge patient  2. [redacted] weeks gestation of pregnancy   - Keep scheduled appt with [facility] on 10/14/2022 - Patient verbalized an understanding of the plan of care and agrees.  Raelyn Mora, CNM 10/12/2022, 9:09 PM

## 2022-10-14 ENCOUNTER — Encounter: Payer: Medicaid Other | Admitting: Obstetrics & Gynecology

## 2022-10-15 ENCOUNTER — Encounter (HOSPITAL_COMMUNITY): Payer: Self-pay | Admitting: Obstetrics and Gynecology

## 2022-10-15 ENCOUNTER — Inpatient Hospital Stay (HOSPITAL_COMMUNITY): Payer: Medicaid Other | Admitting: Anesthesiology

## 2022-10-15 ENCOUNTER — Other Ambulatory Visit: Payer: Self-pay

## 2022-10-15 ENCOUNTER — Inpatient Hospital Stay (HOSPITAL_COMMUNITY)
Admission: AD | Admit: 2022-10-15 | Discharge: 2022-10-17 | DRG: 807 | Disposition: A | Discharge status: Home / Self Care | Payer: Medicaid Other | Attending: Obstetrics and Gynecology | Admitting: Obstetrics and Gynecology

## 2022-10-15 DIAGNOSIS — O26893 Other specified pregnancy related conditions, third trimester: Secondary | ICD-10-CM | POA: Diagnosis not present

## 2022-10-15 DIAGNOSIS — Z23 Encounter for immunization: Secondary | ICD-10-CM | POA: Diagnosis not present

## 2022-10-15 DIAGNOSIS — O0933 Supervision of pregnancy with insufficient antenatal care, third trimester: Secondary | ICD-10-CM | POA: Diagnosis not present

## 2022-10-15 DIAGNOSIS — Z148 Genetic carrier of other disease: Secondary | ICD-10-CM

## 2022-10-15 DIAGNOSIS — J45909 Unspecified asthma, uncomplicated: Secondary | ICD-10-CM | POA: Diagnosis not present

## 2022-10-15 DIAGNOSIS — Z3A38 38 weeks gestation of pregnancy: Secondary | ICD-10-CM

## 2022-10-15 DIAGNOSIS — O9952 Diseases of the respiratory system complicating childbirth: Secondary | ICD-10-CM | POA: Diagnosis not present

## 2022-10-15 LAB — CBC
HCT: 38.5 % (ref 36.0–46.0)
Hemoglobin: 11.9 g/dL — ABNORMAL LOW (ref 12.0–15.0)
MCH: 23.2 pg — ABNORMAL LOW (ref 26.0–34.0)
MCHC: 30.9 g/dL (ref 30.0–36.0)
MCV: 75 fL — ABNORMAL LOW (ref 80.0–100.0)
Platelets: 241 10*3/uL (ref 150–400)
RBC: 5.13 MIL/uL — ABNORMAL HIGH (ref 3.87–5.11)
RDW: 14.9 % (ref 11.5–15.5)
WBC: 7.4 10*3/uL (ref 4.0–10.5)
nRBC: 0 % (ref 0.0–0.2)

## 2022-10-15 LAB — HIV ANTIBODY (ROUTINE TESTING W REFLEX): HIV Screen 4th Generation wRfx: NONREACTIVE

## 2022-10-15 LAB — TYPE AND SCREEN
ABO/RH(D): O POS
Antibody Screen: NEGATIVE

## 2022-10-15 LAB — GLUCOSE, CAPILLARY: Glucose-Capillary: 79 mg/dL (ref 70–99)

## 2022-10-15 LAB — RPR: RPR Ser Ql: NONREACTIVE

## 2022-10-15 MED ORDER — LACTATED RINGERS IV SOLN: 500.0000 mL | INTRAVENOUS | Status: DC | PRN

## 2022-10-15 MED ORDER — PRENATAL MULTIVITAMIN CH
1.0000 | ORAL_TABLET | Freq: Every day | ORAL | Status: DC
  Administered 2022-10-16: 1 via ORAL
  Filled 2022-10-15 (×2): qty 1

## 2022-10-15 MED ORDER — ACETAMINOPHEN 325 MG PO TABS
650.0000 mg | ORAL_TABLET | ORAL | Status: DC | PRN
  Administered 2022-10-15 – 2022-10-17 (×5): 650 mg via ORAL
  Filled 2022-10-15 (×6): qty 2

## 2022-10-15 MED ORDER — SIMETHICONE 80 MG PO CHEW: 80.0000 mg | CHEWABLE_TABLET | ORAL | Status: DC | PRN

## 2022-10-15 MED ORDER — OXYTOCIN-SODIUM CHLORIDE 30-0.9 UT/500ML-% IV SOLN
2.5000 [IU]/h | INTRAVENOUS | Status: DC
  Administered 2022-10-15: 2.5 [IU]/h via INTRAVENOUS
  Filled 2022-10-15: qty 500

## 2022-10-15 MED ORDER — TETANUS-DIPHTH-ACELL PERTUSSIS 5-2.5-18.5 LF-MCG/0.5 IM SUSY
0.5000 mL | PREFILLED_SYRINGE | Freq: Once | INTRAMUSCULAR | Status: AC
  Administered 2022-10-17: 0.5 mL via INTRAMUSCULAR
  Filled 2022-10-15: qty 0.5

## 2022-10-15 MED ORDER — ONDANSETRON HCL 4 MG/2ML IJ SOLN
4.0000 mg | Freq: Four times a day (QID) | INTRAMUSCULAR | Status: DC | PRN
  Administered 2022-10-15: 4 mg via INTRAVENOUS
  Filled 2022-10-15: qty 2

## 2022-10-15 MED ORDER — SOD CITRATE-CITRIC ACID 500-334 MG/5ML PO SOLN: 30.0000 mL | ORAL | Status: DC | PRN

## 2022-10-15 MED ORDER — FLEET ENEMA 7-19 GM/118ML RE ENEM: 1.0000 | ENEMA | RECTAL | Status: DC | PRN

## 2022-10-15 MED ORDER — BENZOCAINE-MENTHOL 20-0.5 % EX AERO
1.0000 | INHALATION_SPRAY | CUTANEOUS | Status: DC | PRN
  Administered 2022-10-15: 1 via TOPICAL
  Filled 2022-10-15: qty 56

## 2022-10-15 MED ORDER — PHENYLEPHRINE 80 MCG/ML (10ML) SYRINGE FOR IV PUSH (FOR BLOOD PRESSURE SUPPORT): 80.0000 ug | PREFILLED_SYRINGE | INTRAVENOUS | Status: DC | PRN

## 2022-10-15 MED ORDER — SODIUM CHLORIDE 0.9 % IV SOLN
5.0000 10*6.[IU] | Freq: Once | INTRAVENOUS | Status: AC
  Administered 2022-10-15: 5 10*6.[IU] via INTRAVENOUS
  Filled 2022-10-15: qty 5

## 2022-10-15 MED ORDER — ZOLPIDEM TARTRATE 5 MG PO TABS: 5.0000 mg | ORAL_TABLET | Freq: Every evening | ORAL | Status: DC | PRN

## 2022-10-15 MED ORDER — ONDANSETRON HCL 4 MG PO TABS
4.0000 mg | ORAL_TABLET | ORAL | Status: DC | PRN
  Administered 2022-10-17 (×2): 4 mg via ORAL
  Filled 2022-10-15 (×2): qty 1

## 2022-10-15 MED ORDER — LACTATED RINGERS IV SOLN
500.0000 mL | Freq: Once | INTRAVENOUS | Status: AC
  Administered 2022-10-15: 500 mL via INTRAVENOUS

## 2022-10-15 MED ORDER — PENICILLIN G POT IN DEXTROSE 60000 UNIT/ML IV SOLN
3.0000 10*6.[IU] | INTRAVENOUS | Status: DC
  Administered 2022-10-15: 3 10*6.[IU] via INTRAVENOUS
  Filled 2022-10-15: qty 50

## 2022-10-15 MED ORDER — WITCH HAZEL-GLYCERIN EX PADS: 1.0000 | MEDICATED_PAD | CUTANEOUS | Status: DC | PRN

## 2022-10-15 MED ORDER — OXYCODONE-ACETAMINOPHEN 5-325 MG PO TABS: 1.0000 | ORAL_TABLET | ORAL | Status: DC | PRN

## 2022-10-15 MED ORDER — LIDOCAINE HCL (PF) 1 % IJ SOLN
INTRAMUSCULAR | Status: DC | PRN
  Administered 2022-10-15: 3 mL via EPIDURAL
  Administered 2022-10-15: 5 mL via EPIDURAL

## 2022-10-15 MED ORDER — OXYTOCIN BOLUS FROM INFUSION
333.0000 mL | Freq: Once | INTRAVENOUS | Status: AC
  Administered 2022-10-15: 333 mL via INTRAVENOUS

## 2022-10-15 MED ORDER — ONDANSETRON HCL 4 MG/2ML IJ SOLN: 4.0000 mg | INTRAMUSCULAR | Status: DC | PRN

## 2022-10-15 MED ORDER — FENTANYL CITRATE (PF) 100 MCG/2ML IJ SOLN
50.0000 ug | INTRAMUSCULAR | Status: DC | PRN
  Administered 2022-10-15: 100 ug via INTRAVENOUS
  Filled 2022-10-15: qty 2

## 2022-10-15 MED ORDER — FENTANYL-BUPIVACAINE-NACL 0.5-0.125-0.9 MG/250ML-% EP SOLN
12.0000 mL/h | EPIDURAL | Status: DC | PRN
  Administered 2022-10-15: 12 mL/h via EPIDURAL
  Filled 2022-10-15 (×2): qty 250

## 2022-10-15 MED ORDER — EPHEDRINE 5 MG/ML INJ: 10.0000 mg | INTRAVENOUS | Status: DC | PRN

## 2022-10-15 MED ORDER — OXYCODONE-ACETAMINOPHEN 5-325 MG PO TABS: 2.0000 | ORAL_TABLET | ORAL | Status: DC | PRN

## 2022-10-15 MED ORDER — COCONUT OIL OIL: 1.0000 | TOPICAL_OIL | Status: DC | PRN

## 2022-10-15 MED ORDER — DIPHENHYDRAMINE HCL 50 MG/ML IJ SOLN: 12.5000 mg | INTRAMUSCULAR | Status: DC | PRN

## 2022-10-15 MED ORDER — IBUPROFEN 600 MG PO TABS
600.0000 mg | ORAL_TABLET | Freq: Four times a day (QID) | ORAL | Status: DC
  Administered 2022-10-15 – 2022-10-17 (×7): 600 mg via ORAL
  Filled 2022-10-15 (×8): qty 1

## 2022-10-15 MED ORDER — DIBUCAINE (PERIANAL) 1 % EX OINT: 1.0000 | TOPICAL_OINTMENT | CUTANEOUS | Status: DC | PRN

## 2022-10-15 MED ORDER — SENNOSIDES-DOCUSATE SODIUM 8.6-50 MG PO TABS
2.0000 | ORAL_TABLET | Freq: Every day | ORAL | Status: DC
  Administered 2022-10-16 – 2022-10-17 (×2): 2 via ORAL
  Filled 2022-10-15 (×2): qty 2

## 2022-10-15 MED ORDER — OXYCODONE HCL 5 MG PO TABS
5.0000 mg | ORAL_TABLET | Freq: Four times a day (QID) | ORAL | Status: DC | PRN
  Administered 2022-10-15: 5 mg via ORAL
  Filled 2022-10-15: qty 1

## 2022-10-15 MED ORDER — LACTATED RINGERS IV SOLN: INTRAVENOUS | Status: DC

## 2022-10-15 MED ORDER — DIPHENHYDRAMINE HCL 25 MG PO CAPS: 25.0000 mg | ORAL_CAPSULE | Freq: Four times a day (QID) | ORAL | Status: DC | PRN

## 2022-10-15 MED ORDER — ACETAMINOPHEN 325 MG PO TABS: 650.0000 mg | ORAL_TABLET | ORAL | Status: DC | PRN

## 2022-10-15 MED ORDER — LIDOCAINE HCL (PF) 1 % IJ SOLN: 30.0000 mL | INTRAMUSCULAR | Status: DC | PRN

## 2022-10-15 NOTE — Discharge Summary (Addendum)
Postpartum Discharge Summary  Date of Service updated 10/17/2022     Patient Name: Amanda Davies DOB: 01-14-2001 MRN: [id number]  Date of admission: 10/15/2022 Delivery date:10/15/2022  Delivering provider: Lavonda Jumbo  Date of discharge: 10/17/2022  Admitting diagnosis: Normal labor and delivery [O80] Intrauterine pregnancy: [redacted]w[redacted]d     Secondary diagnosis:  Principal Problem:   Normal labor and delivery  Additional problems: None    Discharge diagnosis: Term Pregnancy Delivered                                              Post partum procedures: none Augmentation: AROM Complications: None  Hospital course: Onset of Labor With Vaginal Delivery      22 y.o. yo [id number] at [redacted]w[redacted]d was admitted in Latent Labor on 10/15/2022. Labor course that was uncomplicated.   Membrane Rupture Time/Date: 1:30 PM ,10/15/2022   Delivery Method:Vaginal, Spontaneous  Episiotomy: None  Lacerations:   none Patient had an uncomplicated postpartum course. She is ambulating, tolerating a regular diet, passing flatus, and urinating well. Patient is discharged home in stable condition on 10/17/22.  Newborn Data: Birth date:10/15/2022  Birth time:4:21 PM  Gender:Female  Living status:Living  Apgars:8 ,9  Weight:2480 g   Magnesium Sulfate received: No BMZ received: No Rhophylac:No MMR:No T-DaP: Offered Flu: No Transfusion:No  Physical exam  Vitals:   10/16/22 0719 10/16/22 1300 10/16/22 2106 10/17/22 0546  BP: 112/76 117/83 123/79 107/69  Pulse: (!) 59 64 70 76  Resp: 17 18 18 16   Temp: 98 F (36.7 C) 98.3 F (36.8 C) 98.1 F (36.7 C) 97.8 F (36.6 C)  TempSrc: Oral Oral Oral Oral  SpO2:  99% 100%    General: alert, cooperative, and no distress Lochia: appropriate Uterine Fundus: firm Incision: N/A DVT Evaluation: No evidence of DVT seen on physical exam. No significant calf/ankle edema. Labs: Lab Results  Component Value Date   WBC 10.8 (H) 10/16/2022   HGB 10.1 (L)  10/16/2022   HCT 31.6 (L) 10/16/2022   MCV 74.5 (L) 10/16/2022   PLT 176 10/16/2022      Latest Ref Rng & Units 09/22/2022    8:05 PM  CMP  Glucose 70 - 99 mg/dL 76   BUN 6 - 20 mg/dL <5   Creatinine 2.53 - 1.00 mg/dL 6.64   Sodium 403 - 474 mmol/L 136   Potassium 3.5 - 5.1 mmol/L 3.0   Chloride 98 - 111 mmol/L 105   CO2 22 - 32 mmol/L 22   Calcium 8.9 - 10.3 mg/dL 8.3   Total Protein 6.5 - 8.1 g/dL 6.0   Total Bilirubin 0.3 - 1.2 mg/dL 1.0   Alkaline Phos 38 - 126 U/L 185   AST 15 - 41 U/L 13   ALT 0 - 44 U/L 9    Edinburgh Score:    10/16/2022    2:31 PM  Edinburgh Postnatal Depression Scale Screening Tool  I have been able to laugh and see the funny side of things. 0  I have looked forward with enjoyment to things. 0  I have blamed myself unnecessarily when things went wrong. 0  I have been anxious or worried for no good reason. 0  I have felt scared or panicky for no good reason. 0  Things have been getting on top of me. 0  I have been so unhappy that  I have had difficulty sleeping. 0  I have felt sad or miserable. 0  I have been so unhappy that I have been crying. 0  The thought of harming myself has occurred to me. 0  Edinburgh Postnatal Depression Scale Total 0     After visit meds:  Allergies as of 10/17/2022   No Known Allergies      Medication List     STOP taking these medications    cyclobenzaprine 10 MG tablet Commonly known as: FLEXERIL   ondansetron 8 MG disintegrating tablet Commonly known as: ZOFRAN-ODT   ondansetron 8 MG tablet Commonly known as: Zofran   potassium chloride SA 20 MEQ tablet Commonly known as: KLOR-CON M   terconazole 0.4 % vaginal cream Commonly known as: TERAZOL 7   terconazole 0.8 % vaginal cream Commonly known as: TERAZOL 3   Vitafol Gummies 3.33-0.333-34.8 MG Chew       TAKE these medications    acetaminophen 325 MG tablet Commonly known as: Tylenol Take 2 tablets (650 mg total) by mouth every 4 (four)  hours as needed (for pain scale < 4).   Blood Pressure Kit Devi 1 kit by Does not apply route once a week.   ferrous sulfate 325 (65 FE) MG tablet Take 1 tablet (325 mg total) by mouth every other day.   Gojji Weight Scale Misc 1 Device by Does not apply route every 30 (thirty) days.   ibuprofen 600 MG tablet Commonly known as: ADVIL Take 1 tablet (600 mg total) by mouth every 6 (six) hours.   oxyCODONE 5 MG immediate release tablet Commonly known as: Oxy IR/ROXICODONE Take 1-2 tablets (5-10 mg total) by mouth every 6 (six) hours as needed for severe pain or moderate pain.   polyethylene glycol powder 17 GM/SCOOP powder Commonly known as: GLYCOLAX/MIRALAX Take 17 g by mouth daily.   prenatal vitamin w/FE, FA 29-1 MG Chew chewable tablet Chew 1 tablet by mouth daily at 12 noon.   senna-docusate 8.6-50 MG tablet Commonly known as: Senokot-S Take 2 tablets by mouth daily. Start taking on: October 18, 2022         Discharge home in stable condition Infant Feeding: Bottle and Breast Infant Disposition:home with mother Discharge instruction: per After Visit Summary and Postpartum booklet. Activity: Advance as tolerated. Pelvic rest for 6 weeks.  Diet: routine diet Future Appointments: Future Appointments  Date Time Provider Department Center  11/13/2022  3:50 PM Warden Fillers, MD [facility] None   Follow up Visit:  Message sent to [facility] by Autry-Lott on 10/17/2022  Please schedule this patient for a In person postpartum visit in 4 weeks with the following provider: MD and APP. Additional Postpartum F/U: n/a   Low risk pregnancy complicated by:  hx of PTD, hx of SGA in prior pregnancy. Limited [facility] Delivery mode:  Vaginal, Spontaneous  Anticipated Birth Control:  Unsure   10/17/2022 Silvano Bilis, MD

## 2022-10-15 NOTE — MAU Note (Signed)
...  Amanda Davies is a 22 y.o. at [redacted]w[redacted]d here in MAU reporting: Ongoing CTX since being discharged from MAU on 6/9. She reports they increased around 0400 this morning and are now every 3-4 minutes. Denies VB or LOF. +FM.   Reports short cervix. GBS+. Has not had a prenatal visit since this past February.  Onset of complaint: 0300 Pain score: 10/10 lower abdomen   FHT:  165 initial external Lab orders placed from triage:  MAU Labor Eval

## 2022-10-15 NOTE — Anesthesia Procedure Notes (Signed)
Epidural Patient location during procedure: OB Start time: 10/15/2022 11:33 AM End time: 10/15/2022 11:38 AM  Staffing Anesthesiologist: Linton Rump, MD Performed: anesthesiologist   Preanesthetic Checklist Completed: patient identified, IV checked, site marked, risks and benefits discussed, surgical consent, monitors and equipment checked, pre-op evaluation and timeout performed  Epidural Patient position: sitting Prep: DuraPrep and site prepped and draped Patient monitoring: continuous pulse ox and blood pressure Approach: midline Location: L3-L4 Injection technique: LOR saline  Needle:  Needle type: Tuohy  Needle gauge: 17 G Needle length: 9 cm and 9 Needle insertion depth: 5.5 cm Catheter type: closed end flexible Catheter size: 19 Gauge Catheter at skin depth: 10 cm Test dose: negative  Assessment Events: blood not aspirated, no cerebrospinal fluid, injection not painful, no injection resistance, no paresthesia and negative IV test  Additional Notes The patient has requested an epidural for labor pain management. Risks and benefits including, but not limited to, infection, bleeding, local anesthetic toxicity, headache, hypotension, back pain, block failure, etc. were discussed with the patient. The patient expressed understanding and consented to the procedure. I confirmed that the patient has no bleeding disorders and is not taking blood thinners. I confirmed the patient's last platelet count with the nurse. A time-out was performed immediately prior to the procedure. Please see nursing documentation for vital signs. Sterile technique was used throughout the whole procedure. Once LOR achieved, the epidural catheter threaded easily without resistance. Aspiration of the catheter was negative for blood and CSF. The epidural was dosed slowly and an infusion was started.  1 attempt(s)Reason for block:procedure for pain

## 2022-10-15 NOTE — Progress Notes (Signed)
Labor Progress Note Amanda Davies is a 22 y.o. [id number] at [redacted]w[redacted]d presented for SOL.   S: No acute concerns.   O:  BP (!) 106/55   Pulse 86   Temp 98 F (36.7 C) (Oral)   Resp 18   LMP 01/21/2022 (Approximate)   SpO2 100%  EFM: 130bpm/moderate/+accels, occasional variable  CVE: Dilation: 6 Effacement (%): 80 Cervical Position: Middle Station: -1 Presentation: Vertex Exam by:: Autry lott  A&P: 22 y.o. [id number] [redacted]w[redacted]d here for SOL.  #Labor: Progressing well. S/p AROM clear fluid.  #Pain: Epidural #FWB: Cat II, overall reassuring  #GBS not done, PCR pending. PCN ppx  [name] Autry-Lott, DO 1:52 PM

## 2022-10-15 NOTE — Anesthesia Preprocedure Evaluation (Signed)
Anesthesia Evaluation  Patient identified by MRN, date of birth, ID band Patient awake    Reviewed: Allergy & Precautions, NPO status , Patient's Chart, lab work & pertinent test results  History of Anesthesia Complications Negative for: history of anesthetic complications  Airway Mallampati: III  TM Distance: >3 FB Neck ROM: Full    Dental   Pulmonary asthma (childhood)    Pulmonary exam normal breath sounds clear to auscultation       Cardiovascular negative cardio ROS  Rhythm:Regular Rate:Normal     Neuro/Psych negative neurological ROS     GI/Hepatic negative GI ROS, Neg liver ROS,,,  Endo/Other  negative endocrine ROS    Renal/GU negative Renal ROS     Musculoskeletal   Abdominal   Peds  Hematology  (+) Blood dyscrasia (alpha thalassemia silent carrier)   Anesthesia Other Findings Platelets 241  Reproductive/Obstetrics (+) Pregnancy                              Anesthesia Physical Anesthesia Plan  ASA: 2  Anesthesia Plan: Epidural   Post-op Pain Management:    Induction:   PONV Risk Score and Plan:   Airway Management Planned:   Additional Equipment:   Intra-op Plan:   Post-operative Plan:   Informed Consent: I have reviewed the patients History and Physical, chart, labs and discussed the procedure including the risks, benefits and alternatives for the proposed anesthesia with the patient or authorized representative who has indicated his/her understanding and acceptance.       Plan Discussed with: CRNA and Anesthesiologist  Anesthesia Plan Comments: (I have discussed risks of neuraxial anesthesia including but not limited to infection, bleeding, nerve injury, back pain, headache, seizures, and failure of block. Patient denies bleeding disorders and is not currently anticoagulated. Labs have been reviewed. Risks and benefits discussed. All patient's questions  answered.  )         Anesthesia Quick Evaluation

## 2022-10-15 NOTE — H&P (Signed)
OBSTETRIC ADMISSION HISTORY AND PHYSICAL  Amanda Davies is a 22 y.o. female [phone] with IUP at [redacted]w[redacted]d by LMP presenting for SOL. She reports +FMs, No LOF, no VB, no blurry vision, headaches or peripheral edema, and RUQ pain.  She plans on breast and  feeding. She is contemplating birth control.  She received her prenatal care at  [facility]     Dating: By LMP --->  Estimated Date of Delivery: 10/28/22  Sono:    @[redacted]w[redacted]d , CWD, normal anatomy, cephalic presentation, anterior placental lie, 1918g, 21% EFW   Prenatal History/Complications:  -Limited PNC; initiated @ 19 weeks, the only visit -hx of PTD -hx of SGA in prior pregnancy -alpha thal silent carrier  Past Medical History: Past Medical History:  Diagnosis Date   Acne    Asthma    when younger   Chlamydia    Encounter for vaginal delivery 12/13/2020   GBS bacteriuria 04/25/2019   Hx of pre-term labor    Insufficient prenatal care in third trimester 07/21/2019   PROM (premature rupture of membranes) 09/10/2019   Supervision of high risk pregnancy, antepartum 11/27/2016          Nursing Staff  Provider  Office Location   [facility]  Dating   Unsure LMP  Language   English  Anatomy US   ?growth restriction?, Dopplers 3.12, anatomy normal, f/u 3 weeks  Flu Vaccine   declined  Genetic Screen   NIPS: low risk   AFP:   Not done      TDaP vaccine    declined  Hgb A1C or   GTT  Early   Third trimester   Rhogam    NA     LAB RESULTS   Feeding Plan   Breast  Blood Type  O/Posit   Supervision of normal pregnancy, antepartum 06/26/2020          Nursing Staff  Provider  Office Location   [facility]   Dating      Language   English   Anatomy US      Flu Vaccine   No   Genetic Screen   NIPS:   AFP:   First Screen:  Quad:      TDaP Vaccine    Declined   Hgb A1C or   GTT  Early   Third trimester   COVID Vaccine   No     LAB RESULTS   Rhogam      Blood Type  O/Positive/-- (03/15 1523)   Feeding Plan  Both   Antibody  Negative (03/15 1523)      Past Surgical  History: Past Surgical History:  Procedure Laterality Date   NO PAST SURGERIES      Obstetrical History: OB History     Gravida  7   Para  5   Term  3   Preterm  2   AB  1   Living  5      SAB  1   IAB      Ectopic      Multiple  0   Live Births  5           Social History Social History   Socioeconomic History   Marital status: Single    Spouse name: Not on file   Number of children: 2   Years of education: 12   Highest education level: High school graduate  Occupational History   Not on file  Tobacco Use   Smoking status: Never  Smokeless tobacco: Never  Vaping Use   Vaping Use: Never used  Substance and Sexual Activity   Alcohol use: No   Drug use: No   Sexual activity: Not Currently    Partners: Male    Birth control/protection: None  Other Topics Concern   Not on file  Social History Narrative   Not on file   Social Determinants of Health   Financial Resource Strain: Not on file  Food Insecurity: No Food Insecurity (10/15/2022)   Hunger Vital Sign    Worried About Running Out of Food in the Last Year: Never true    Ran Out of Food in the Last Year: Never true  Transportation Needs: No Transportation Needs (10/15/2022)   PRAPARE - Administrator, Civil Service (Medical): No    Lack of Transportation (Non-Medical): No  Physical Activity: Not on file  Stress: Not on file  Social Connections: Not on file    Family History: Family History  Problem Relation Age of Onset   Anxiety disorder Mother    Asthma Sister    Asthma Brother     Allergies: No Known Allergies  Medications Prior to Admission  Medication Sig Dispense Refill Last Dose   Blood Pressure Monitoring (BLOOD PRESSURE KIT) DEVI 1 kit by Does not apply route once a week. 1 each 0    cyclobenzaprine (FLEXERIL) 10 MG tablet Take 1 tablet (10 mg total) by mouth 3 (three) times daily as needed for muscle spasms. (Patient not taking: Reported on 06/05/2022) 30  tablet 0    ferrous sulfate 325 (65 FE) MG tablet Take 1 tablet (325 mg total) by mouth every other day. 30 tablet 0    Misc. Devices (GOJJI WEIGHT SCALE) MISC 1 Device by Does not apply route every 30 (thirty) days. 1 each 0    ondansetron (ZOFRAN) 8 MG tablet Take 1 tablet (8 mg total) by mouth every 8 (eight) hours as needed for nausea or vomiting. 30 tablet 2    ondansetron (ZOFRAN-ODT) 8 MG disintegrating tablet Take 1 tablet (8 mg total) by mouth every 8 (eight) hours as needed for nausea or vomiting. 30 tablet 1    potassium chloride SA (KLOR-CON M) 20 MEQ tablet Take 1 tablet (20 mEq total) by mouth 2 (two) times daily for 3 days. 6 tablet 0    Prenatal Vit-Fe Phos-FA-Omega (VITAFOL GUMMIES) 3.33-0.333-34.8 MG CHEW Chew 3 tablets by mouth daily. (Patient not taking: Reported on 04/03/2022) 90 tablet 11    Prenatal Vit-Fe Phos-FA-Omega (VITAFOL GUMMIES) 3.33-0.333-34.8 MG CHEW Chew 3 tablets by mouth daily. (Patient not taking: Reported on 07/28/2022) 90 tablet 11    prenatal vitamin w/FE, FA (NATACHEW) 29-1 MG CHEW chewable tablet Chew 1 tablet by mouth daily at 12 noon. 30 tablet 11    terconazole (TERAZOL 3) 0.8 % vaginal cream Place 1 applicator vaginally at bedtime. (Patient not taking: Reported on 06/27/2022) 20 g 0    terconazole (TERAZOL 7) 0.4 % vaginal cream Place 1 applicator vaginally at bedtime. (Patient not taking: Reported on 06/27/2022) 45 g 0      Review of Systems   All systems reviewed and negative except as stated in HPI  Blood pressure 125/82, pulse 85, temperature 97.7 F (36.5 C), temperature source Oral, resp. rate 18, last menstrual period 01/21/2022, SpO2 100 %, unknown if currently breastfeeding. General appearance: alert and no distress Lungs: normal effort Heart: regular rate Abdomen: gravid Extremities: No LE edema Presentation: cephalic Fetal monitoringBaseline: 125 bpm,  Variability: Good {> 6 bpm), Accelerations: Reactive, and Decelerations:  Absent Uterine activity Every 1-3 mins Dilation: 5.5 Effacement (%): 70 Station: -1 Exam by:: Imagene Sheller, RN   Prenatal labs: ABO, Rh: --/--/O POS (06/12 1008) Antibody: NEG (06/12 1008) Rubella: 12.20 (02/01 1420) RPR: Non Reactive (02/01 1420)  HBsAg: Negative (02/01 1420)  HIV: Non Reactive (06/12 1008)  GBS:    1 hr Glucola not done, A1c 5.3 Genetic screening  LR, silent carrier alpha thal Anatomy US wnl, female  Prenatal Transfer Tool  Maternal Diabetes: Testing not complete 2/2 limited PNC Genetic Screening: Alpha thal silent carrier Maternal Ultrasounds/Referrals: Normal Fetal Ultrasounds or other Referrals:  None Maternal Substance Abuse:  No Significant Maternal Medications:  None Significant Maternal Lab Results:  None Number of Prenatal Visits:Less than or equal to 3 verified prenatal visits Other Comments:  None  Results for orders placed or performed during the hospital encounter of 10/15/22 (from the past 24 hour(s))  CBC   Collection Time: 10/15/22 10:08 AM  Result Value Ref Range   WBC 7.4 4.0 - 10.5 K/uL   RBC 5.13 (H) 3.87 - 5.11 MIL/uL   Hemoglobin 11.9 (L) 12.0 - 15.0 g/dL   HCT 86.5 78.4 - 69.6 %   MCV 75.0 (L) 80.0 - 100.0 fL   MCH 23.2 (L) 26.0 - 34.0 pg   MCHC 30.9 30.0 - 36.0 g/dL   RDW 29.5 28.4 - 13.2 %   Platelets 241 150 - 400 K/uL   nRBC 0.0 0.0 - 0.2 %  HIV Antibody (routine testing w rflx)   Collection Time: 10/15/22 10:08 AM  Result Value Ref Range   HIV Screen 4th Generation wRfx Non Reactive Non Reactive  Type and screen [facility]   Collection Time: 10/15/22 10:08 AM  Result Value Ref Range   ABO/RH(D) O POS    Antibody Screen NEG    Sample Expiration      10/18/2022,2359 Performed at [facility], [address] [place], [place] [address]     Patient Active Problem List   Diagnosis Date Noted   Normal labor and delivery 10/15/2022   History of prior pregnancy with SGA newborn 09/10/2022    Supervision of other normal pregnancy, antepartum 04/03/2022   Alpha thalassemia silent carrier 03/24/2019   H/O preterm delivery, currently pregnant 01/01/2016    Assessment/Plan:  Amanda Davies is a 22 y.o. [id number] at [redacted]w[redacted]d here for SOL.   #Labor: Plan to AROM when comfortable with epidural #Pain: Epidural #FWB: Cat I  #ID: GBS unknown, PCN ppx #MOF: Both #MOC: Unsure #Circ: Yes  Limited to no PNC -SWpp  Amanda Marling Autry-Lott, DO  10/15/2022, 12:26 PM

## 2022-10-16 LAB — CBC
HCT: 31.6 % — ABNORMAL LOW (ref 36.0–46.0)
Hemoglobin: 10.1 g/dL — ABNORMAL LOW (ref 12.0–15.0)
MCH: 23.8 pg — ABNORMAL LOW (ref 26.0–34.0)
MCHC: 32 g/dL (ref 30.0–36.0)
MCV: 74.5 fL — ABNORMAL LOW (ref 80.0–100.0)
Platelets: 176 10*3/uL (ref 150–400)
RBC: 4.24 MIL/uL (ref 3.87–5.11)
RDW: 14.9 % (ref 11.5–15.5)
WBC: 10.8 10*3/uL — ABNORMAL HIGH (ref 4.0–10.5)
nRBC: 0 % (ref 0.0–0.2)

## 2022-10-16 MED ORDER — OXYCODONE HCL 5 MG PO TABS
5.0000 mg | ORAL_TABLET | Freq: Four times a day (QID) | ORAL | Status: DC | PRN
  Administered 2022-10-16: 5 mg via ORAL
  Administered 2022-10-16: 10 mg via ORAL
  Administered 2022-10-16: 5 mg via ORAL
  Administered 2022-10-17: 10 mg via ORAL
  Filled 2022-10-16: qty 2
  Filled 2022-10-16 (×2): qty 1
  Filled 2022-10-16: qty 2
  Filled 2022-10-16: qty 1

## 2022-10-16 NOTE — Progress Notes (Addendum)
POSTPARTUM PROGRESS NOTE  Post Partum Day 1  Subjective:  Amanda Davies is a 22 y.o. [id number] s/p VD at [redacted]w[redacted]d.  She reports she is doing well. No acute events overnight. She denies any problems with ambulating, voiding or po intake. Denies nausea or vomiting.  Pain is well controlled.  Lochia is appropriate.  Objective: Blood pressure 112/76, pulse (!) 59, temperature 98 F (36.7 C), temperature source Oral, resp. rate 17, last menstrual period 01/21/2022, SpO2 100 %, unknown if currently breastfeeding.  Physical Exam:  General: alert, cooperative and no distress Chest: no respiratory distress Heart:regular rate, distal pulses intact Abdomen: soft, nontender,  Uterine Fundus: firm, appropriately tender DVT Evaluation: No calf swelling or tenderness Extremities: No LE edema Skin: warm, dry  Recent Labs    10/15/22 1008 10/16/22 0531  HGB 11.9* 10.1*  HCT 38.5 31.6*    Assessment/Plan: Amanda Davies is a 22 y.o. [id number] s/p VD at [redacted]w[redacted]d   PPD#1 - Doing well  Routine postpartum care Contraception: Unsure Feeding: Both Limited PNC-SW pp Dispo: Plan for discharge 6/14.   LOS: 1 day   Lavonda Jumbo, DO OB Fellow, Faculty Practice [facility], Center for [facility] 10/16/2022, 8:23 AM

## 2022-10-16 NOTE — Clinical Social Work Maternal (Signed)
CLINICAL SOCIAL WORK MATERNAL/CHILD NOTE   Patient Details  Name: Amanda Davies MRN: [id number] Date of Birth: 09/28/2000   Date:  10/16/2022   Clinical Social Worker Initiating Note:  Cleo Villamizar      Date/Time: Initiated:  10/16/22/1413      Child's Name:  [name]    Biological Parents:  Mother, Father (Jariah Congrove 10/20/1998, Deonta Irby 07/25/1996)    Need for Interpreter:  None    Reason for Referral:  Late or No Prenatal Care      Address:  [address] [place] [address]    Phone number:  [phone] (home)      Additional phone number:    Household Members/Support Persons (HM/SP):   Household Member/Support Person 1, Household Member/Support Person 2, Household Member/Support Person 3, Household Member/Support Person 4, Household Member/Support Person 5, Household Member/Support Person 6     HM/SP Name Relationship DOB or Age  HM/SP -1 Deonta Irby FOB 07/25/1996  HM/SP -2 Zy'leyah Irby daughter 12/30/2015  HM/SP -3 Aa'layah Freimark daughter 04/23/2017  HM/SP -4 Ka'layah Marinez daughter 07/09/2018  HM/SP -5 Ma'leyah Irby daughter 09/10/2019  HM/SP -6 Kaleb Irby son 12/24/2020  HM/SP -7     HM/SP -8         Natural Supports (not living in the home):       Professional Supports:      Employment: Unemployed    Type of Work:      Education:  9 to 11 years    Homebound arranged: No   Financial Resources:  Medicaid    Other Resources:  Food Stamps      Cultural/Religious Considerations Which May Impact Care:     Strengths:  Ability to meet basic needs  , Pediatrician chosen    Psychotropic Medications:          Pediatrician:    [place]   Pediatrician List:    [facility] ([address])  [place]   [place]   [place]   [place]   [place]       Pediatrician Fax Number:     Risk Factors/Current Problems:  None    Cognitive State:  Able to Concentrate  ,  Alert      Mood/Affect:  Calm  , Comfortable      CSW Assessment: CSW received consult for no PNC. CSW met with MOB to complete assessment and offer support. CSW entered the room and observed MOB in bed holding the infant and FOB at bedside. CSW introduced self, CSW role and reason for visit, MOB was agreeable to visit and allowed FOB to remain in the room. CSW inquired about how MOB was feeling, MOB reported good. CSW inquired about and MH concerns, MOB denied any MH concerns, CSW assessed for safety, MOB denied any SI or HI. CSW provided education regarding the baby blues period vs. perinatal mood disorders, discussed treatment and gave resources for mental health follow up if concerns arise.  CSW recommends self-evaluation during the postpartum time period using the New Mom Checklist from Postpartum Progress and encouraged MOB to contact a medical professional if symptoms are noted at any time.     CSW inquired about MOB lack of PNC, MOB reported she was home with 3 of her children and FOB was at work MOB reported she did not want to take 3 other children to appointments. MOB reported no barriers. CSW informed MOB of the hospital drug   screen policy, MOB verbalized understanding. CSW notified MOB infants UDS was negative and the CDS was pending an A CPS report would be made if warranted. MOB verbalized understanding.    CSW provided review of Sudden Infant Death Syndrome (SIDS) precautions.  MOB reported she has a pack and play and her sister is purchasing a car seat. MOB reported she could use help with diaper and wipes, CSW offer to make a referral to [facility]. MOB was agreeable, MOB has an appointment with [facility] on July 7 @ 1pm. CSW identifies no further need for intervention and no barriers to discharge at this time.   CSW Plan/Description:  No Further Intervention Required/No Barriers to Discharge, Psychosocial Support and Ongoing Assessment of Needs, Sudden Infant Death Syndrome  (SIDS) Education, Perinatal Mood and Anxiety Disorder (PMADs) Education, Hospital Drug Screen Policy Information, CSW Will Continue to Monitor Umbilical Cord Tissue Drug Screen Results and Make Report if Warranted      Fox Salminen M Christana Angelica, LCSW 10/16/2022, 2:49 PM 

## 2022-10-16 NOTE — Anesthesia Postprocedure Evaluation (Signed)
Anesthesia Post Note  Patient: Amanda Davies  Procedure(s) Performed: AN AD HOC LABOR EPIDURAL     Patient location during evaluation: Mother Baby Anesthesia Type: Epidural Level of consciousness: awake and alert and oriented Pain management: satisfactory to patient Vital Signs Assessment: post-procedure vital signs reviewed and stable Respiratory status: respiratory function stable Cardiovascular status: stable Postop Assessment: no headache, no backache, epidural receding, patient able to bend at knees, no signs of nausea or vomiting, adequate PO intake and able to ambulate Anesthetic complications: no   No notable events documented.  Last Vitals:  Vitals:   10/16/22 0315 10/16/22 0719  BP: 112/76 112/76  Pulse: 67 (!) 59  Resp: 16 17  Temp: 36.6 C 36.7 C  SpO2: 100%     Last Pain:  Vitals:   10/16/22 0719  TempSrc: Oral  PainSc: 7    Pain Goal:                   Rook Maue

## 2022-10-17 ENCOUNTER — Other Ambulatory Visit (HOSPITAL_COMMUNITY): Payer: Self-pay

## 2022-10-17 MED ORDER — OXYCODONE HCL 5 MG PO TABS
5.0000 mg | ORAL_TABLET | Freq: Four times a day (QID) | ORAL | 0 refills | Status: DC | PRN
  Filled 2022-10-17 (×2): qty 12, 2d supply, fill #0

## 2022-10-17 MED ORDER — IBUPROFEN 600 MG PO TABS
600.0000 mg | ORAL_TABLET | Freq: Four times a day (QID) | ORAL | 0 refills | Status: DC
  Filled 2022-10-17: qty 30, 8d supply, fill #0

## 2022-10-17 MED ORDER — ACETAMINOPHEN 325 MG PO TABS
650.0000 mg | ORAL_TABLET | ORAL | 0 refills | Status: DC | PRN
  Filled 2022-10-17: qty 100, 9d supply, fill #0

## 2022-10-17 MED ORDER — POLYETHYLENE GLYCOL 3350 17 GM/SCOOP PO POWD
17.0000 g | Freq: Every day | ORAL | 0 refills | Status: DC
  Filled 2022-10-17: qty 238, 14d supply, fill #0

## 2022-10-17 MED ORDER — SENNOSIDES-DOCUSATE SODIUM 8.6-50 MG PO TABS
2.0000 | ORAL_TABLET | Freq: Every day | ORAL | 0 refills | Status: DC
  Filled 2022-10-17: qty 60, 30d supply, fill #0

## 2022-10-18 ENCOUNTER — Ambulatory Visit (HOSPITAL_COMMUNITY): Payer: Self-pay

## 2022-10-18 NOTE — Lactation Note (Signed)
This note was copied from a baby's chart. Lactation Consultation Note  Patient Name: Boy Elika Kampf ZOXWR'U Date: 10/17/22 Age:24 hours  Baby [redacted]w[redacted]d and < 6 lbs.  Mother is breastfeeding and supplementing with formula.  Encouraged post pumping q 3 hours and giving volume back to baby.  Baby recently breastfed and mother is resting with baby.  WIC pump referral and stork pump paperwork sent.  Encouraged q 3 hours feeding, waking baby if needed.  Suggest calling for latch assistance.     Feeding Nipple Type: Extra Slow Flow    Hardie Pulley 10/18/2022, 7:57 AM

## 2022-10-18 NOTE — Lactation Note (Signed)
This note was copied from a baby's chart. Lactation Consultation Note  Patient Name: Amanda Davies ZOXWR'U Date: 10/18/2022 Age:24 hours Reason for consult: Follow-up assessment  P6,  Baby was latched upon entering with intermittent swallows.  Weight increased.  Baby is supplemented with formula 15-20 ml.  Reviewed engorgement care and monitoring voids/stools. Mother has peronal DEBP now from stork to be able to pump at home.    Maternal Data Has patient been taught Hand Expression?: Yes Does the patient have breastfeeding experience prior to this delivery?: Yes How long did the patient breastfeed?: 11 mos-1 year  Feeding Mother's Current Feeding Choice: Breast Milk and Formula Nipple Type: Extra Slow Flow  LATCH Score Latch: Grasps breast easily, tongue down, lips flanged, rhythmical sucking.  Audible Swallowing: Spontaneous and intermittent  Type of Nipple: Everted at rest and after stimulation  Comfort (Breast/Nipple): Soft / non-tender  Hold (Positioning): No assistance needed to correctly position infant at breast.  LATCH Score: 10   Lactation Tools Discussed/Used  DEBP  Interventions Interventions: Education  Discharge Discharge Education: Engorgement and breast care;Warning signs for feeding baby Pump: Stork Pump (Spectra delivered)  Consult Status Consult Status: Complete Date: 10/18/22    [name] [facility] 10/18/2022, 8:56 AM

## 2022-10-21 ENCOUNTER — Other Ambulatory Visit: Payer: Self-pay | Admitting: *Deleted

## 2022-10-21 DIAGNOSIS — K649 Unspecified hemorrhoids: Secondary | ICD-10-CM

## 2022-10-21 MED ORDER — LIDOCAINE HCL URETHRAL/MUCOSAL 2 % EX GEL
1.0000 | CUTANEOUS | 2 refills | Status: DC | PRN
Start: 2022-10-21 — End: 2023-09-09

## 2022-10-21 NOTE — Progress Notes (Signed)
TC from pt reporting signs and symptoms of hemorrhoids. Discussed w/ Sharen Counter, CNM. Pt advised to continue stool softener and Miralax and Ibuprofen. RX lidocaine jelly sent. Advised Tucks pads OTC and soaking in clean tub with warm water and epsom salt. Advised to seek care for heavy bleeding from area. MC msg with all recommendations and pt education sent.

## 2022-11-03 ENCOUNTER — Telehealth (HOSPITAL_COMMUNITY): Payer: Self-pay | Admitting: *Deleted

## 2022-11-03 NOTE — Telephone Encounter (Signed)
11/03/2022  Name: Amanda Davies MRN: [id number] DOB: 2000/05/19  Reason for Call:  Transition of Care Hospital Discharge Call  Contact Status: Patient Contact Status: Message  Language assistant needed: Interpreter Mode: Interpreter Not Needed        Follow-Up Questions:    Inocente Salles Postnatal Depression Scale:  In the Past 7 Days:    PHQ2-9 Depression Scale:     Discharge Follow-up:    Post-discharge interventions: NA  Salena Saner, RN  11/03/22  14:18 pm

## 2022-11-13 ENCOUNTER — Ambulatory Visit: Payer: Medicaid Other | Admitting: Obstetrics and Gynecology

## 2023-05-06 NOTE — L&D Delivery Note (Signed)
 Delivery Note Amanda Davies is a 23 y.o. [id number] at [redacted]w[redacted]d admitted for labor.   GBS Status:  POSITIVE/-- (10/02 2334)  Labor course: Initial SVE: 6/90/-1. Augmentation with: nothing. She then progressed to complete.  ROM: @ delivery, delivered en caul with clear fluid  Birth: After a 2 push 2nd stage, she delivered a Live born female  Birth Weight:   APGAR: 9, 9  Newborn Delivery   Birth date/time: 02/05/2024 02:17:16 Delivery type: Vaginal, Spontaneous        Delivered via spontaneous vaginal delivery (Presentation: OA, en Caul ). Nuchal cord present: Yes. . Shoulders and body delivered in usual fashion. Membranes removed from baby's face/body Infant placed directly on mom's abdomen for bonding/skin-to-skin, baby dried and stimulated. Cord clamped x 2 after 1 minute and cut by pt;s mom.  Cord blood collected. Placenta delivered-  with 3 vessels. 20u Pitocin  in 500cc LR given as a bolus prior to delivery of placenta, as well as TXA.  Fundus firm with massage. Placenta inspected and appears to be intact with a 3 VC.  Sponge and instrument count were correct x2.  Intrapartum complications:  None Anesthesia:  epidural Lacerations:  none Suture Repair:  EBL (mL):75   Mom to postpartum.  Baby to Couplet care / Skin to Skin. Placenta to Pathology for FGR   Plans to Breast and bottlefeed Contraception: Depo-Provera  injections Circumcision: N/A  Note sent to [facility]: [facility] for pp visit.  Delivery Report:  Review the Delivery Report for details.     Signed: Cathlean Ely, DNP,CNM 02/05/2024, 2:31 AM

## 2023-08-01 ENCOUNTER — Other Ambulatory Visit: Payer: Self-pay

## 2023-08-01 ENCOUNTER — Emergency Department (HOSPITAL_BASED_OUTPATIENT_CLINIC_OR_DEPARTMENT_OTHER)
Admission: EM | Admit: 2023-08-01 | Discharge: 2023-08-01 | Disposition: A | Discharge status: Home / Self Care | Attending: Emergency Medicine | Admitting: Emergency Medicine

## 2023-08-01 DIAGNOSIS — N12 Tubulo-interstitial nephritis, not specified as acute or chronic: Secondary | ICD-10-CM

## 2023-08-01 DIAGNOSIS — O2301 Infections of kidney in pregnancy, first trimester: Secondary | ICD-10-CM | POA: Insufficient documentation

## 2023-08-01 DIAGNOSIS — Z3A00 Weeks of gestation of pregnancy not specified: Secondary | ICD-10-CM | POA: Diagnosis not present

## 2023-08-01 DIAGNOSIS — Z3A01 Less than 8 weeks gestation of pregnancy: Secondary | ICD-10-CM | POA: Diagnosis not present

## 2023-08-01 LAB — BASIC METABOLIC PANEL WITH GFR
Anion gap: 10 (ref 5–15)
BUN: 11 mg/dL (ref 6–20)
CO2: 22 mmol/L (ref 22–32)
Calcium: 9.2 mg/dL (ref 8.9–10.3)
Chloride: 102 mmol/L (ref 98–111)
Creatinine, Ser: 0.62 mg/dL (ref 0.44–1.00)
GFR, Estimated: >60 mL/min
Glucose, Bld: 95 mg/dL (ref 70–99)
Potassium: 3.6 mmol/L (ref 3.5–5.1)
Sodium: 134 mmol/L — ABNORMAL LOW (ref 135–145)

## 2023-08-01 LAB — URINALYSIS, W/ REFLEX TO CULTURE (INFECTION SUSPECTED)
Bilirubin Urine: NEGATIVE
Glucose, UA: NEGATIVE mg/dL
Ketones, ur: NEGATIVE mg/dL
Nitrite: POSITIVE — AB
Protein, ur: 30 mg/dL — AB
Specific Gravity, Urine: 1.034 — ABNORMAL HIGH (ref 1.005–1.030)
Trans Epithel, UA: 2
WBC, UA: >50 WBC/hpf (ref 0–5)
pH: 6 (ref 5.0–8.0)

## 2023-08-01 LAB — CBC WITH DIFFERENTIAL/PLATELET
Abs Immature Granulocytes: 0.05 10*3/uL (ref 0.00–0.07)
Basophils Absolute: 0 10*3/uL (ref 0.0–0.1)
Basophils Relative: 0 %
Eosinophils Absolute: 0.4 10*3/uL (ref 0.0–0.5)
Eosinophils Relative: 4 %
HCT: 41.1 % (ref 36.0–46.0)
Hemoglobin: 13.5 g/dL (ref 12.0–15.0)
Immature Granulocytes: 1 %
Lymphocytes Relative: 25 %
Lymphs Abs: 2.4 10*3/uL (ref 0.7–4.0)
MCH: 25.2 pg — ABNORMAL LOW (ref 26.0–34.0)
MCHC: 32.8 g/dL (ref 30.0–36.0)
MCV: 76.8 fL — ABNORMAL LOW (ref 80.0–100.0)
Monocytes Absolute: 0.7 10*3/uL (ref 0.1–1.0)
Monocytes Relative: 8 %
Neutro Abs: 5.9 10*3/uL (ref 1.7–7.7)
Neutrophils Relative %: 62 %
Platelets: 243 10*3/uL (ref 150–400)
RBC: 5.35 MIL/uL — ABNORMAL HIGH (ref 3.87–5.11)
RDW: 14.4 % (ref 11.5–15.5)
WBC: 9.5 10*3/uL (ref 4.0–10.5)
nRBC: 0 % (ref 0.0–0.2)

## 2023-08-01 LAB — HCG, QUANTITATIVE, PREGNANCY: hCG, Beta Chain, Quant, S: 78322 m[IU]/mL — ABNORMAL HIGH (ref <5)

## 2023-08-01 MED ORDER — SODIUM CHLORIDE 0.9 % IV BOLUS
1000.0000 mL | Freq: Once | INTRAVENOUS | Status: AC
  Administered 2023-08-01: 1000 mL via INTRAVENOUS

## 2023-08-01 MED ORDER — LACTATED RINGERS IV BOLUS: 1000.0000 mL | Freq: Once | INTRAVENOUS | Status: DC

## 2023-08-01 MED ORDER — ONDANSETRON HCL 4 MG PO TABS: 4.0000 mg | ORAL_TABLET | Freq: Four times a day (QID) | ORAL | 0 refills | Status: AC

## 2023-08-01 MED ORDER — CEFPODOXIME PROXETIL 200 MG PO TABS: 200.0000 mg | ORAL_TABLET | Freq: Two times a day (BID) | ORAL | 0 refills | Status: AC

## 2023-08-01 MED ORDER — SODIUM CHLORIDE 0.9 % IV SOLN
1.0000 g | Freq: Once | INTRAVENOUS | Status: AC
  Administered 2023-08-01: 1 g via INTRAVENOUS
  Filled 2023-08-01: qty 10

## 2023-08-01 NOTE — ED Triage Notes (Addendum)
 Left flank pain. Dysuria. Polyuria. Pregnant-unknown how far along- had positive Upreg in jail last month.

## 2023-08-01 NOTE — ED Provider Notes (Signed)
 [place] EMERGENCY DEPARTMENT AT [facility] Provider Note   CSN: [id number] Arrival date & time: 08/01/23  1842     History  No chief complaint on file.   Amanda Davies is a 23 y.o. female.  This a 23 year old female who is here today for dysuria and flank pain.  Patient believes she might be pregnant.  She says that she had a positive pregnancy test.  She is uncertain when her last period was.  Patient is also here with her 29-year-old child who fell off of the bed today.        Home Medications Prior to Admission medications   Medication Sig Start Date End Date Taking? Authorizing Provider  cefpodoxime (VANTIN) 200 MG tablet Take 1 tablet (200 mg total) by mouth 2 (two) times daily for 10 days. 08/01/23 08/11/23 Yes Arletha Pili, DO  acetaminophen (TYLENOL) 325 MG tablet Take 2 tablets (650 mg total) by mouth every 4 (four) hours as needed (for pain scale < 4). 10/17/22   Tiffany Kocher, DO  Blood Pressure Monitoring (BLOOD PRESSURE KIT) DEVI 1 kit by Does not apply route once a week. 04/03/22   Constant, Peggy, MD  ferrous sulfate 325 (65 FE) MG tablet Take 1 tablet (325 mg total) by mouth every other day. 12/15/20 02/13/21  Warner Mccreedy, MD  ibuprofen (ADVIL) 600 MG tablet Take 1 tablet (600 mg total) by mouth every 6 (six) hours. 10/17/22   Tiffany Kocher, DO  lidocaine (XYLOCAINE) 2 % jelly Apply 1 Application topically as needed. 10/21/22   Leftwich-Kirby, Wilmer Floor, CNM  Misc. Devices (GOJJI WEIGHT SCALE) MISC 1 Device by Does not apply route every 30 (thirty) days. 04/03/22   Constant, Peggy, MD  oxyCODONE (OXY IR/ROXICODONE) 5 MG immediate release tablet Take 1-2 tablets (5-10 mg total) by mouth every 6 (six) hours as needed for severe pain or moderate pain. 10/17/22   Wouk, Wilfred Curtis, MD  polyethylene glycol powder [facility]) 17 GM/SCOOP powder Take 17 g by mouth daily. 10/17/22   Tiffany Kocher, DO  prenatal vitamin w/FE, FA (NATACHEW) 29-1 MG CHEW  chewable tablet Chew 1 tablet by mouth daily at 12 noon. 09/10/22   Thressa Sheller D, CNM  senna-docusate (SENOKOT-S) 8.6-50 MG tablet Take 2 tablets by mouth daily. 10/18/22   Tiffany Kocher, DO      Allergies    Patient has no known allergies.    Review of Systems   Review of Systems  Physical Exam Updated Vital Signs BP 139/86   Pulse (!) 105   Temp 98.3 F (36.8 C) (Oral)   Resp 17   SpO2 100%  Physical Exam Vitals reviewed.  Constitutional:      Appearance: She is not toxic-appearing.  Eyes:     Pupils: Pupils are equal, round, and reactive to light.  Cardiovascular:     Rate and Rhythm: Normal rate.  Pulmonary:     Effort: Pulmonary effort is normal.  Abdominal:     General: Abdomen is flat.     Palpations: Abdomen is soft.     Tenderness: There is left CVA tenderness.  Musculoskeletal:        General: Normal range of motion.  Skin:    General: Skin is warm.  Neurological:     General: No focal deficit present.     Mental Status: She is alert.     ED Results / Procedures / Treatments   Labs (all labs ordered are listed, but only abnormal results are displayed)  Labs Reviewed  URINALYSIS, W/ REFLEX TO CULTURE (INFECTION SUSPECTED) - Abnormal; Notable for the following components:      Result Value   APPearance HAZY (*)    Specific Gravity, Urine 1.034 (*)    Hgb urine dipstick SMALL (*)    Protein, ur 30 (*)    Nitrite POSITIVE (*)    Leukocytes,Ua MODERATE (*)    Bacteria, UA MANY (*)    All other components within normal limits  HCG, QUANTITATIVE, PREGNANCY - Abnormal; Notable for the following components:   hCG, Beta Chain, Quant, S 16,109 (*)    All other components within normal limits  BASIC METABOLIC PANEL WITH GFR - Abnormal; Notable for the following components:   Sodium 134 (*)    All other components within normal limits  CBC WITH DIFFERENTIAL/PLATELET - Abnormal; Notable for the following components:   RBC 5.35 (*)    MCV 76.8 (*)    MCH  25.2 (*)    All other components within normal limits  URINE CULTURE  GC/CHLAMYDIA PROBE AMP ([place]) NOT AT [facility]    EKG None  Radiology No results found.  Procedures Ultrasound ED OB Pelvic  Date/Time: 08/01/2023 8:49 PM  Performed by: Arletha Pili, DO Authorized by: Arletha Pili, DO   Procedure details:    Indications: evaluate for IUP     Assess:  Intrauterine pregnancy   Technique:  Transabdominal obstetric (HCG+) exam   Images: archived    Uterine findings:    Intrauterine pregnancy: identified     Fetal heart rate: identified      Comments:     Fetal Heart rate of 166     Medications Ordered in ED Medications  cefTRIAXone (ROCEPHIN) 1 g in sodium chloride 0.9 % 100 mL IVPB (1 g Intravenous New Bag/Given 08/01/23 2110)  sodium chloride 0.9 % bolus 1,000 mL (1,000 mLs Intravenous New Bag/Given 08/01/23 2111)    ED Course/ Medical Decision Making/ A&P                                 Medical Decision Making 23 year old female here today with flank pain, dysuria and possibly pregnant.  Plan-urinalysis, hCG, basic labs ordered.  Patient nontoxic-appearing.  Overall looks well.  GC ordered.  Reassessment 8:50 PM-patient is pregnant.  Identified a positive intrauterine pregnancy on bedside ultrasound.  Heart rate 166.  Images archived.  Nitrate positive urine.  Will treat with Rocephin.  Urine culture reflexed to send.  Patient tolerating p.o., she is appropriate for oral antibiotics.  Will discharge patient with cefpodoxime.  Amount and/or Complexity of Data Reviewed Labs: ordered.           Final Clinical Impression(s) / ED Diagnoses Final diagnoses:  Pyelonephritis  Pyelonephritis affecting pregnancy in first trimester    Rx / DC Orders ED Discharge Orders          Ordered    cefpodoxime (VANTIN) 200 MG tablet  2 times daily        08/01/23 2141              Anders Simmonds T, DO 08/01/23 2144

## 2023-08-01 NOTE — Discharge Instructions (Addendum)
 You have a urinary tract infection, possibly pyelonephritis or infection of your kidney.  You received an antibiotic here in the emergency room called Rocephin.  I have sent you a prescription for an antibiotic called cefdinir that you need to take once in the morning and once in the evening for the next 10 days.  I have also included the telephone number for an OB/GYN office.  You can call them to establish prenatal care.

## 2023-08-03 LAB — GC/CHLAMYDIA PROBE AMP (~~LOC~~) NOT AT ARMC
Chlamydia: NEGATIVE
Comment: NEGATIVE
Comment: NORMAL
Neisseria Gonorrhea: NEGATIVE

## 2023-08-04 LAB — URINE CULTURE: Culture: >=100000 — AB

## 2023-08-05 ENCOUNTER — Telehealth (HOSPITAL_BASED_OUTPATIENT_CLINIC_OR_DEPARTMENT_OTHER): Payer: Self-pay | Admitting: *Deleted

## 2023-08-05 NOTE — Telephone Encounter (Signed)
 Post ED Visit - Positive Culture Follow-up  Culture report reviewed by antimicrobial stewardship pharmacist: [facility] [x]  [place], [place].D. []  Celedonio Miyamoto, Pharm.D., BCPS AQ-ID []  Garvin Fila, Pharm.D., BCPS []  Georgina Pillion, [address].D., BCPS []  [place], [address].D., BCPS, AAHIVP []  Estella Husk, Pharm.D., BCPS, AAHIVP []  Lysle Pearl, PharmD, BCPS []  Phillips Climes, PharmD, BCPS []  Agapito Games, PharmD, BCPS []  Verlan Friends, PharmD []  Mervyn Gay, PharmD, BCPS []  Vinnie Level, PharmD  [facility] []  Len Childs, PharmD []  Greer Pickerel, PharmD []  Adalberto Cole, PharmD []  Perlie Gold, Rph []  Lonell Face) Jean Rosenthal, PharmD []  Earl Many, PharmD []  Junita Push, PharmD []  Dorna Leitz, PharmD []  Terrilee Files, PharmD []  Lynann Beaver, PharmD []  Keturah Barre, PharmD []  Loralee Pacas, PharmD []  Bernadene Person, PharmD   Positive urine culture Treated with Cefpodoxime, organism sensitive to the same and no further patient follow-up is required at this time.  Amanda Polio Garner Davies 08/05/2023, 7:45 AM

## 2023-09-09 ENCOUNTER — Inpatient Hospital Stay (HOSPITAL_COMMUNITY)

## 2023-09-09 ENCOUNTER — Inpatient Hospital Stay (HOSPITAL_COMMUNITY)
Admission: AD | Admit: 2023-09-09 | Discharge: 2023-09-09 | Disposition: A | Discharge status: Home / Self Care | Attending: Obstetrics and Gynecology | Admitting: Obstetrics and Gynecology

## 2023-09-09 ENCOUNTER — Encounter (HOSPITAL_COMMUNITY): Payer: Self-pay | Admitting: *Deleted

## 2023-09-09 DIAGNOSIS — O26892 Other specified pregnancy related conditions, second trimester: Secondary | ICD-10-CM | POA: Diagnosis not present

## 2023-09-09 DIAGNOSIS — Z3689 Encounter for other specified antenatal screening: Secondary | ICD-10-CM | POA: Insufficient documentation

## 2023-09-09 DIAGNOSIS — O321XX0 Maternal care for breech presentation, not applicable or unspecified: Secondary | ICD-10-CM | POA: Diagnosis not present

## 2023-09-09 DIAGNOSIS — O09212 Supervision of pregnancy with history of pre-term labor, second trimester: Secondary | ICD-10-CM | POA: Diagnosis not present

## 2023-09-09 DIAGNOSIS — Z3A16 16 weeks gestation of pregnancy: Secondary | ICD-10-CM

## 2023-09-09 DIAGNOSIS — O219 Vomiting of pregnancy, unspecified: Secondary | ICD-10-CM | POA: Diagnosis not present

## 2023-09-09 DIAGNOSIS — O09292 Supervision of pregnancy with other poor reproductive or obstetric history, second trimester: Secondary | ICD-10-CM | POA: Diagnosis not present

## 2023-09-09 DIAGNOSIS — K047 Periapical abscess without sinus: Secondary | ICD-10-CM

## 2023-09-09 LAB — URINALYSIS, ROUTINE W REFLEX MICROSCOPIC
Bilirubin Urine: NEGATIVE
Glucose, UA: NEGATIVE mg/dL
Hgb urine dipstick: NEGATIVE
Ketones, ur: NEGATIVE mg/dL
Leukocytes,Ua: NEGATIVE
Nitrite: NEGATIVE
Protein, ur: 30 mg/dL — AB
Specific Gravity, Urine: 1.02 (ref 1.005–1.030)
pH: 7 (ref 5.0–8.0)

## 2023-09-09 LAB — URINALYSIS, MICROSCOPIC (REFLEX)

## 2023-09-09 LAB — WET PREP, GENITAL
Clue Cells Wet Prep HPF POC: NONE SEEN
Sperm: NONE SEEN
Trich, Wet Prep: NONE SEEN
WBC, Wet Prep HPF POC: <10 (ref <10)
Yeast Wet Prep HPF POC: NONE SEEN

## 2023-09-09 MED ORDER — ASPIRIN 81 MG PO TBEC: 81.0000 mg | DELAYED_RELEASE_TABLET | Freq: Every day | ORAL | 2 refills | Status: DC

## 2023-09-09 MED ORDER — AMOXICILLIN-POT CLAVULANATE 875-125 MG PO TABS: 1.0000 | ORAL_TABLET | Freq: Two times a day (BID) | ORAL | 0 refills | Status: AC

## 2023-09-09 MED ORDER — ONDANSETRON 4 MG PO TBDP: 4.0000 mg | ORAL_TABLET | Freq: Three times a day (TID) | ORAL | 0 refills | Status: DC | PRN

## 2023-09-09 MED ORDER — PREPLUS 27-1 MG PO TABS: 1.0000 | ORAL_TABLET | Freq: Every day | ORAL | 13 refills | Status: AC

## 2023-09-09 NOTE — MAU Note (Signed)
 MAU Triage Note  .Amanda Davies is a 23 y.o. at Unknown here in MAU reporting: last night she was having cramping last night until 5am. Pain in stomach is gone. Now has lower back pain. Stated she has n/v when she woke up this morning vomited 5 times. ( Pt sitting in triage with large soda and bag of food from zaxbys).  Reports a yellow vag discharge. Has not had prenatal care. Had been seen at [facility] in March and they where able to see a fetus with a heartbeat around 3 months  LMP: unknown Onset of complaint: last night Pain score: 8 Vitals:   09/09/23 1354  BP: 102/65  Pulse: (!) 108  Resp: 18  Temp: 97.8 F (36.6 C)     FHT: 145  Lab orders placed from triage: u/a

## 2023-09-09 NOTE — MAU Provider Note (Signed)
 History     CSN: [id number]  Arrival date and time: 09/09/23 1313   Event Date/Time   First Provider Initiated Contact with Patient 09/09/2023  1:49 PM   Chief Complaint  Patient presents with   Abdominal Pain   Nausea    HPI  Amanda Davies is a 23 y.o. [id number] at [redacted]w[redacted]d who presents to the MAU for nausea, vomiting, and abdominal pain.   Reports nausea/vomiting since this AM, started acutely this AM. No sick contacts. Has bag of Zaxby's w her, has been drinking more sodas, wondering if this is affecting her symptoms. Had five episodes of emesis today. Ran out of Zofran  yesterday, was using Zofran  she had from prior Rx that had been working well for her. Requesting refill.  Had pyelo approx 2 months ago, symptoms today are not similar, but reports some mild low back pain and is unsure if her symptoms are due to a new UTI. No fevers, chills. Denies dysuria, urinary urgency/frequency.  Reports tooth ache as well, reports there is a painful "hole" in her tooth. She states her dentist plans on removing tooth so long as it is okay with OB. Has been taking Tylenol  w relief of pain.  Has known hx pre-e with previous pregnancy and short cervix. Has not established OB care yet, plans to establish w [facility].   Past Medical History:  Diagnosis Date   Acne    Asthma    when younger   Chlamydia    Encounter for vaginal delivery 12/13/2020   GBS bacteriuria 04/25/2019   Hx of pre-term labor    Insufficient prenatal care in third trimester 07/21/2019   PROM (premature rupture of membranes) 09/10/2019   Supervision of high risk pregnancy, antepartum 11/27/2016          Nursing Staff  Provider  Office Location   [facility]  Dating   Unsure LMP  Language   English  Anatomy US    ?growth restriction?, Dopplers 3.12, anatomy normal, f/u 3 weeks  Flu Vaccine   declined  Genetic Screen   NIPS: low risk   AFP:   Not done      TDaP vaccine    declined  Hgb A1C or   GTT  Early   Third trimester   Rhogam    NA      LAB RESULTS   Feeding Plan   Breast  Blood Type  O/Posit   Supervision of normal pregnancy, antepartum 06/26/2020          Nursing Staff  Provider  Office Location   [facility]   Dating      Language   English   Anatomy US       Flu Vaccine   No   Genetic Screen   NIPS:   AFP:   First Screen:  Quad:      TDaP Vaccine    Declined   Hgb A1C or   GTT  Early   Third trimester   COVID Vaccine   No     LAB RESULTS   Rhogam      Blood Type  O/Positive/-- (03/15 1523)   Feeding Plan  Both   Antibody  Negative (03/15 1523)      Past Surgical History:  Procedure Laterality Date   NO PAST SURGERIES      Family History  Problem Relation Age of Onset   Anxiety disorder Mother    Asthma Sister    Asthma Brother     Social History  Tobacco Use   Smoking status: Never   Smokeless tobacco: Never  Vaping Use   Vaping status: Never Used  Substance Use Topics   Alcohol use: No   Drug use: No    Allergies: No Known Allergies  No medications prior to admission.    ROS reviewed and pertinent positives and negatives as documented in HPI.  Physical Exam   Blood pressure 102/65, pulse (!) 108, temperature 97.8 F (36.6 C), resp. rate 18, unknown if currently breastfeeding.  Physical Exam Constitutional:      General: She is not in acute distress.    Appearance: Normal appearance. She is not ill-appearing.  HENT:     Head: Normocephalic and atraumatic.  Cardiovascular:     Rate and Rhythm: Normal rate.  Pulmonary:     Effort: Pulmonary effort is normal.     Breath sounds: Normal breath sounds.  Abdominal:     Palpations: Abdomen is soft.     Tenderness: There is no abdominal tenderness. There is no guarding.  Musculoskeletal:        General: Normal range of motion.  Skin:    General: Skin is warm and dry.     Findings: No rash.  Neurological:     General: No focal deficit present.     Mental Status: She is alert and oriented to person, place, and time.   BSUS performed with IUP, FHR  135bpm  MAU Course  Procedures  MDM 23 y.o. [id number] at [redacted]w[redacted]d presenting for n/v of pregnancy, abdominal pain, and dental pain. Reports n/v well controlled by Zofran , requesting refill. Dental pain d/t ?dental infection, due to have tooth pulled, no systemic si/sx, will Rx Augmentin  to cover empirically until seen by dentist, letter given. Redated today -- [redacted]w[redacted]d by U/S, CL 3cm. Rec pt start ldASA, rec close f/up w OB, message sent to [facility]. Return precautions addressed. Stable for d/c.   Assessment and Plan     ICD-10-CM   1. Nausea and vomiting of pregnancy, antepartum  O21.9     2. Dental infection  K04.7     3. History of prior pregnancy with short cervix, currently pregnant in second trimester  O09.292     4. History of pre-eclampsia in prior pregnancy, currently pregnant in second trimester  O09.292     Rx sent for Zofran , ldASA, PNV today Message sent to [facility] for NOB visit CL measured at 3cm today Stable for d/c   Melanie Spires, MD OB Fellow, Faculty Practice [facility], Center for [facility]  09/09/2023, 5:30 PM

## 2023-09-10 LAB — GC/CHLAMYDIA PROBE AMP (~~LOC~~) NOT AT ARMC
Chlamydia: NEGATIVE
Comment: NEGATIVE
Comment: NORMAL
Neisseria Gonorrhea: NEGATIVE

## 2023-09-11 LAB — CULTURE, OB URINE: Culture: 40000 — AB

## 2023-09-23 DIAGNOSIS — O0990 Supervision of high risk pregnancy, unspecified, unspecified trimester: Secondary | ICD-10-CM | POA: Insufficient documentation

## 2023-09-24 ENCOUNTER — Encounter: Admitting: Obstetrics and Gynecology

## 2023-10-01 ENCOUNTER — Ambulatory Visit: Admitting: Physician Assistant

## 2023-10-01 VITALS — BP 109/68 | HR 88 | Wt 157.1 lb

## 2023-10-01 DIAGNOSIS — Z1331 Encounter for screening for depression: Secondary | ICD-10-CM

## 2023-10-01 DIAGNOSIS — O0992 Supervision of high risk pregnancy, unspecified, second trimester: Secondary | ICD-10-CM

## 2023-10-01 DIAGNOSIS — O0990 Supervision of high risk pregnancy, unspecified, unspecified trimester: Secondary | ICD-10-CM

## 2023-10-01 DIAGNOSIS — O09292 Supervision of pregnancy with other poor reproductive or obstetric history, second trimester: Secondary | ICD-10-CM

## 2023-10-01 DIAGNOSIS — O09299 Supervision of pregnancy with other poor reproductive or obstetric history, unspecified trimester: Secondary | ICD-10-CM

## 2023-10-01 DIAGNOSIS — Z3A19 19 weeks gestation of pregnancy: Secondary | ICD-10-CM

## 2023-10-01 DIAGNOSIS — Z131 Encounter for screening for diabetes mellitus: Secondary | ICD-10-CM | POA: Diagnosis not present

## 2023-10-01 MED ORDER — ONDANSETRON 4 MG PO TBDP: 4.0000 mg | ORAL_TABLET | Freq: Three times a day (TID) | ORAL | 0 refills | Status: DC | PRN

## 2023-10-01 NOTE — Progress Notes (Signed)
 PRENATAL VISIT NOTE  Subjective:  Amanda Davies is a 23 y.o. [id number] at [redacted]w[redacted]d being seen today for her first prenatal visit for this pregnancy.  She is currently monitored for the following issues for this high-risk pregnancy and has H/O preterm delivery, currently pregnant; Alpha thalassemia silent carrier; History of prior pregnancy with SGA newborn; Normal labor and delivery; Supervision of high risk pregnancy, antepartum; and Hx of preeclampsia, prior pregnancy, currently pregnant on their problem list.  Patient reports no complaints.  Contractions: Irritability. Vag. Bleeding: None.  Movement: Absent. Denies leaking of fluid.   She is planning to breastfeed. Desires vasectomy for husband, but considering tubal ligation.   The following portions of the patient's history were reviewed and updated as appropriate: allergies, current medications, past family history, past medical history, past social history, past surgical history and problem list.   Objective:   Vitals:   10/01/23 1514  BP: 109/68  Pulse: 88  Weight: 157 lb 1.6 oz (71.3 kg)    Fetal Status: Fetal Heart Rate (bpm): 147   Movement: Absent     General:  Alert, oriented and cooperative. Patient is in no acute distress.  Skin: Skin is warm and dry. No rash noted.   Cardiovascular: Normal heart rate and rhythm noted  Respiratory: Normal respiratory effort, no problems with respiration noted. Clear to auscultation.   Abdomen: Soft, gravid, appropriate for gestational age. Normal bowel sounds. Non-tender. Pain/Pressure: Present     Pelvic: Cervical exam deferred       Normal cervical contour, no lesions, no bleeding following pap, normal discharge  Extremities: Normal range of motion.  Edema: None  Mental Status: Normal mood and affect. Normal behavior. Normal judgment and thought content.    Indications for ASA therapy (per uptodate) One of the following: Previous pregnancy with preeclampsia, especially early onset  and with an adverse outcome Yes Multifetal gestation No Chronic hypertension No Type 1 or 2 diabetes mellitus No Chronic kidney disease No Autoimmune disease (antiphospholipid syndrome, systemic lupus erythematosus) No  Two or more of the following: Nulliparity No Obesity (body mass index >30 kg/m2) No Family history of preeclampsia in mother or sister No Age >=35 years No Sociodemographic characteristics (African American race, low socioeconomic level) Yes Personal risk factors (eg, previous pregnancy with low birth weight or small for gestational age infant, previous adverse pregnancy outcome [eg, stillbirth], interval >10 years between pregnancies) Yes   Assessment and Plan:  Pregnancy: [id number] at [redacted]w[redacted]d  1. Supervision of high risk pregnancy, antepartum (Primary) Initial labs drawn. Continue prenatal vitamins. Genetic Screening discussed: NIPS, carrier screening and AFP  Ultrasound discussed; fetal anatomic survey: ordered Problem list reviewed and updated. Reviewed Brx optimized schedule, patient agreeable The nature of [facility] with multiple MDs and other Advanced Practice Providers was explained to patient; also emphasized that residents, students are part of our team. Routine obstetric precautions reviewed.  Discussed daily aspirin   - CBC/D/Plt+RPR+Rh+ABO+RubIgG... - Hemoglobin A1c - PANORAMA PRENATAL TEST - AFP, Serum, Open Spina Bifida - Culture, OB Urine - Urinalysis, Routine w reflex microscopic - ondansetron  (ZOFRAN -ODT) 4 MG disintegrating tablet; Take 1 tablet (4 mg total) by mouth every 8 (eight) hours as needed for nausea or vomiting.  Dispense: 30 tablet; Refill: 0  2. [redacted] weeks gestation of pregnancy Anticipatory guidance about next visits/weeks of pregnancy given.   3. Hx of preeclampsia, prior pregnancy, currently pregnant Reported per patient Continue ASA  Preterm labor/first trimester warning symptoms and general  obstetric precautions including but not limited to vaginal bleeding, contractions, leaking of fluid and fetal movement were reviewed in detail with the patient.  Please refer to After Visit Summary for other counseling recommendations.   Return in about 4 weeks (around 10/29/2023) for [facility].  Future Appointments  Date Time Provider Department Center  10/29/2023  1:10 PM Gabrielle Joiner, MD [facility] None   Luevenia Saha, PA-C

## 2023-10-01 NOTE — Patient Instructions (Addendum)
 [email or web address]

## 2023-10-03 LAB — CBC/D/PLT+RPR+RH+ABO+RUBIGG...
Antibody Screen: NEGATIVE
Basophils Absolute: 0 10*3/uL (ref 0.0–0.2)
Basos: 0 %
EOS (ABSOLUTE): 0.1 10*3/uL (ref 0.0–0.4)
Eos: 1 %
HCV Ab: NONREACTIVE
HIV Screen 4th Generation wRfx: NONREACTIVE
Hematocrit: 42.7 % (ref 34.0–46.6)
Hemoglobin: 13.2 g/dL (ref 11.1–15.9)
Hepatitis B Surface Ag: NEGATIVE
Immature Grans (Abs): 0.1 10*3/uL (ref 0.0–0.1)
Immature Granulocytes: 1 %
Lymphocytes Absolute: 1.8 10*3/uL (ref 0.7–3.1)
Lymphs: 21 %
MCH: 25 pg — ABNORMAL LOW (ref 26.6–33.0)
MCHC: 30.9 g/dL — ABNORMAL LOW (ref 31.5–35.7)
MCV: 81 fL (ref 79–97)
Monocytes Absolute: 0.9 10*3/uL (ref 0.1–0.9)
Monocytes: 10 %
Neutrophils Absolute: 5.8 10*3/uL (ref 1.4–7.0)
Neutrophils: 67 %
Platelets: 225 10*3/uL (ref 150–450)
RBC: 5.29 x10E6/uL — ABNORMAL HIGH (ref 3.77–5.28)
RDW: 14.4 % (ref 11.7–15.4)
RPR Ser Ql: NONREACTIVE
Rh Factor: POSITIVE
Rubella Antibodies, IGG: 12.9 {index} (ref >0.99)
WBC: 8.6 10*3/uL (ref 3.4–10.8)

## 2023-10-03 LAB — AFP, SERUM, OPEN SPINA BIFIDA
AFP MoM: 1.49
AFP Value: 78.6 ng/mL
Gest. Age on Collection Date: 19 wk
Maternal Age At EDD: 25.6 a
OSBR Risk 1 IN: 5617
Test Results:: NEGATIVE
Weight: 157 [lb_av]

## 2023-10-03 LAB — HCV INTERPRETATION

## 2023-10-03 LAB — HEMOGLOBIN A1C
Est. average glucose Bld gHb Est-mCnc: 103 mg/dL
Hgb A1c MFr Bld: 5.2 % (ref 4.8–5.6)

## 2023-10-08 LAB — PANORAMA PRENATAL TEST FULL PANEL:PANORAMA TEST PLUS 5 ADDITIONAL MICRODELETIONS: FETAL FRACTION: 6.5

## 2023-10-11 ENCOUNTER — Ambulatory Visit: Payer: Self-pay | Admitting: Physician Assistant

## 2023-10-29 ENCOUNTER — Encounter: Admitting: Obstetrics

## 2023-11-20 ENCOUNTER — Encounter: Payer: Self-pay | Admitting: Obstetrics

## 2023-11-20 ENCOUNTER — Encounter: Payer: Self-pay | Admitting: *Deleted

## 2023-12-01 ENCOUNTER — Encounter: Admitting: Obstetrics & Gynecology

## 2023-12-01 DIAGNOSIS — O0990 Supervision of high risk pregnancy, unspecified, unspecified trimester: Secondary | ICD-10-CM

## 2023-12-01 DIAGNOSIS — Z3A28 28 weeks gestation of pregnancy: Secondary | ICD-10-CM

## 2023-12-09 ENCOUNTER — Other Ambulatory Visit: Payer: Self-pay

## 2023-12-09 DIAGNOSIS — O0990 Supervision of high risk pregnancy, unspecified, unspecified trimester: Secondary | ICD-10-CM

## 2023-12-09 MED ORDER — ONDANSETRON 4 MG PO TBDP: 4.0000 mg | ORAL_TABLET | Freq: Three times a day (TID) | ORAL | 0 refills | Status: DC | PRN

## 2023-12-14 ENCOUNTER — Other Ambulatory Visit: Payer: Self-pay

## 2023-12-14 ENCOUNTER — Telehealth: Payer: Self-pay

## 2023-12-14 DIAGNOSIS — Z3A29 29 weeks gestation of pregnancy: Secondary | ICD-10-CM

## 2023-12-21 ENCOUNTER — Encounter (HOSPITAL_COMMUNITY): Payer: Self-pay | Admitting: Obstetrics & Gynecology

## 2023-12-21 ENCOUNTER — Encounter (HOSPITAL_COMMUNITY): Payer: Self-pay

## 2023-12-21 ENCOUNTER — Inpatient Hospital Stay (HOSPITAL_COMMUNITY)
Admission: AD | Admit: 2023-12-21 | Discharge: 2023-12-21 | Disposition: A | Discharge status: Home / Self Care | Attending: Certified Nurse Midwife | Admitting: Certified Nurse Midwife

## 2023-12-21 DIAGNOSIS — M549 Dorsalgia, unspecified: Secondary | ICD-10-CM | POA: Diagnosis present

## 2023-12-21 DIAGNOSIS — R109 Unspecified abdominal pain: Secondary | ICD-10-CM

## 2023-12-21 DIAGNOSIS — K5900 Constipation, unspecified: Secondary | ICD-10-CM | POA: Diagnosis not present

## 2023-12-21 DIAGNOSIS — O26893 Other specified pregnancy related conditions, third trimester: Secondary | ICD-10-CM

## 2023-12-21 DIAGNOSIS — O26899 Other specified pregnancy related conditions, unspecified trimester: Secondary | ICD-10-CM

## 2023-12-21 DIAGNOSIS — Z3A30 30 weeks gestation of pregnancy: Secondary | ICD-10-CM | POA: Diagnosis not present

## 2023-12-21 DIAGNOSIS — O4703 False labor before 37 completed weeks of gestation, third trimester: Secondary | ICD-10-CM | POA: Diagnosis present

## 2023-12-21 DIAGNOSIS — O99613 Diseases of the digestive system complicating pregnancy, third trimester: Secondary | ICD-10-CM

## 2023-12-21 DIAGNOSIS — O219 Vomiting of pregnancy, unspecified: Secondary | ICD-10-CM | POA: Insufficient documentation

## 2023-12-21 DIAGNOSIS — R102 Pelvic and perineal pain: Secondary | ICD-10-CM | POA: Diagnosis present

## 2023-12-21 LAB — URINALYSIS, ROUTINE W REFLEX MICROSCOPIC
Bilirubin Urine: NEGATIVE
Glucose, UA: NEGATIVE mg/dL
Hgb urine dipstick: NEGATIVE
Ketones, ur: NEGATIVE mg/dL
Nitrite: NEGATIVE
Protein, ur: NEGATIVE mg/dL
Specific Gravity, Urine: 1.017 (ref 1.005–1.030)
pH: 7 (ref 5.0–8.0)

## 2023-12-21 MED ORDER — ONDANSETRON 4 MG PO TBDP
4.0000 mg | ORAL_TABLET | Freq: Once | ORAL | Status: AC
  Administered 2023-12-21: 4 mg via ORAL
  Filled 2023-12-21: qty 1

## 2023-12-21 MED ORDER — ONDANSETRON 4 MG PO TBDP
4.0000 mg | ORAL_TABLET | Freq: Three times a day (TID) | ORAL | 0 refills | Status: DC | PRN
Start: 2023-12-21 — End: 2024-02-07

## 2023-12-21 MED ORDER — SCOPOLAMINE 1 MG/3DAYS TD PT72: 1.0000 | MEDICATED_PATCH | TRANSDERMAL | 1 refills | Status: DC

## 2023-12-21 MED ORDER — DOCUSATE SODIUM 100 MG PO CAPS: 100.0000 mg | ORAL_CAPSULE | Freq: Two times a day (BID) | ORAL | 0 refills | Status: DC

## 2023-12-21 MED ORDER — SCOPOLAMINE 1 MG/3DAYS TD PT72
1.0000 | MEDICATED_PATCH | Freq: Once | TRANSDERMAL | Status: DC
  Administered 2023-12-21: 1.5 mg via TRANSDERMAL
  Filled 2023-12-21: qty 1

## 2023-12-21 MED ORDER — SMOG ENEMA
960.0000 mL | Freq: Once | RECTAL | Status: AC
  Administered 2023-12-21: 960 mL via RECTAL
  Filled 2023-12-21: qty 960

## 2023-12-21 NOTE — MAU Provider Note (Signed)
 History     CSN: [id number]  Arrival date and time: 12/21/23 1306   Event Date/Time   First Provider Initiated Contact with Patient 12/21/23 1505      Chief Complaint  Patient presents with   Vaginal Discharge   Rupture of Membranes   Contractions    Vaginal Discharge Associated symptoms include abdominal pain and nausea. Pertinent negatives include no chest pain, chills, fever, headaches, vomiting or weakness.    Amanda Davies is a 23 y.o. [id number] at [redacted]w[redacted]d who presents for evaluation of cramping for the past 4 days. Patient reports feeling period cramps and low back pain for the past 4 days. She states she really hasn't got out of bed because moving makes it worse. She isn't sure if they are braxton hicks contractions but doesn't feel her belly get tight/hard. Patient states the cramping is worse at nighttime and feels constant. She has not tried anything for relief other than rest. Patient has also noticed increased frequency over the past few days as well. No dysuria or incomplete emptying. She states she has also struggled with nausea/vomiting throughout the pregnancy and has been out of her Zofran  for the past month. She has tried to eat a couple fries and part of a hotdog today but threw that up. Reports drinking water makes her vomit sometimes. Patient also reports rectal pressure. She had a very small bowel movement yesterday and hasn't had a successful bowel movement since Wednesday/Thursday last week.   Patient rates the pain as a 8/10 and has not tried anything. for the pain.   She denies any vaginal bleeding, discharge, and leaking of fluid. Denies any diarrhea. Reports normal fetal movement.   OB History     Gravida  8   Para  6   Term  4   Preterm  2   AB  1   Living  6      SAB  1   IAB      Ectopic      Multiple  0   Live Births  6           Past Medical History:  Diagnosis Date   Acne    Asthma    when younger   Chlamydia    Encounter  for vaginal delivery 12/13/2020   GBS bacteriuria 04/25/2019   Hx of pre-term labor    Insufficient prenatal care in third trimester 07/21/2019   PROM (premature rupture of membranes) 09/10/2019   Supervision of high risk pregnancy, antepartum 11/27/2016          Nursing Staff  Provider  Office Location   [facility]  Dating   Unsure LMP  Language   English  Anatomy US    ?growth restriction?, Dopplers 3.12, anatomy normal, f/u 3 weeks  Flu Vaccine   declined  Genetic Screen   NIPS: low risk   AFP:   Not done      TDaP vaccine    declined  Hgb A1C or   GTT  Early   Third trimester   Rhogam    NA     LAB RESULTS   Feeding Plan   Breast  Blood Type  O/Posit   Supervision of normal pregnancy, antepartum 06/26/2020          Nursing Staff  Provider  Office Location   [facility]   Dating      Language   English   Anatomy US       Flu Vaccine   No  Genetic Screen   NIPS:   AFP:   First Screen:  Quad:      TDaP Vaccine    Declined   Hgb A1C or   GTT  Early   Third trimester   COVID Vaccine   No     LAB RESULTS   Rhogam      Blood Type  O/Positive/-- (03/15 1523)   Feeding Plan  Both   Antibody  Negative (03/15 1523)      Past Surgical History:  Procedure Laterality Date   NO PAST SURGERIES      Family History  Problem Relation Age of Onset   Anxiety disorder Mother    Asthma Sister    Asthma Brother     Social History   Tobacco Use   Smoking status: Never   Smokeless tobacco: Never  Vaping Use   Vaping status: Never Used  Substance Use Topics   Alcohol use: No   Drug use: No    Allergies: No Known Allergies  Medications Prior to Admission  Medication Sig Dispense Refill Last Dose/Taking   aspirin  EC 81 MG tablet Take 1 tablet (81 mg total) by mouth daily. Take after 12 weeks for prevention of preeclampsia later in pregnancy 300 tablet 2 Past Month   acetaminophen  (TYLENOL ) 325 MG tablet Take 2 tablets (650 mg total) by mouth every 4 (four) hours as needed (for pain scale < 4). 100 tablet 0     Blood Pressure Monitoring (BLOOD PRESSURE KIT) DEVI 1 kit by Does not apply route once a week. (Patient not taking: Reported on 10/01/2023) 1 each 0    ferrous sulfate  325 (65 FE) MG tablet Take 1 tablet (325 mg total) by mouth every other day. 30 tablet 0    Misc. Devices (GOJJI WEIGHT SCALE) MISC 1 Device by Does not apply route every 30 (thirty) days. (Patient not taking: Reported on 10/01/2023) 1 each 0    ondansetron  (ZOFRAN -ODT) 4 MG disintegrating tablet Take 1 tablet (4 mg total) by mouth every 8 (eight) hours as needed for nausea or vomiting. 30 tablet 0 More than a month   Prenatal Vit-Fe Fumarate-FA (PREPLUS) 27-1 MG TABS Take 1 tablet by mouth daily. (Patient not taking: Reported on 10/01/2023) 30 tablet 13 More than a month    Review of Systems  Constitutional:  Positive for appetite change. Negative for chills and fever.  Respiratory:  Negative for shortness of breath.   Cardiovascular:  Negative for chest pain.  Gastrointestinal:  Positive for abdominal distention, abdominal pain, constipation and nausea. Negative for diarrhea and vomiting.  Genitourinary:  Positive for frequency. Negative for dysuria, vaginal bleeding and vaginal discharge.  Musculoskeletal:  Positive for back pain.  Neurological:  Negative for dizziness, weakness, light-headedness and headaches.   Physical Exam   Blood pressure 109/63, pulse 93, temperature 98.6 F (37 C), resp. rate 17, height 5' 2 (1.575 m), weight 68.7 kg, SpO2 100%, unknown if currently breastfeeding.  Patient Vitals for the past 24 hrs:  BP Temp Pulse Resp SpO2 Height Weight  12/21/23 1318 109/63 98.6 F (37 C) 93 17 100 % 5' 2 (1.575 m) 68.7 kg    Physical Exam Vitals reviewed.  Constitutional:      Appearance: Normal appearance.  Cardiovascular:     Rate and Rhythm: Normal rate.  Pulmonary:     Effort: Pulmonary effort is normal.  Abdominal:     General: There is distension.     Tenderness: There is no abdominal tenderness.  There is no right CVA tenderness, left CVA tenderness or guarding.  Skin:    General: Skin is warm and dry.     Capillary Refill: Capillary refill takes less than 2 seconds.  Neurological:     Mental Status: She is alert and oriented to person, place, and time.  Psychiatric:        Mood and Affect: Mood normal.        Behavior: Behavior normal.     Fetal Tracing:  Baseline: 135bpm Variability: moderate Accels: 10x10s Decels: variable x1 followed by accels  Toco: none   MAU Course  Procedures  Results for orders placed or performed during the hospital encounter of 12/21/23 (from the past 24 hours)  Urinalysis, Routine w reflex microscopic -Urine, Clean Catch     Status: Abnormal   Collection Time: 12/21/23  1:25 PM  Result Value Ref Range   Color, Urine YELLOW YELLOW   APPearance HAZY (A) CLEAR   Specific Gravity, Urine 1.017 1.005 - 1.030   pH 7.0 5.0 - 8.0   Glucose, UA NEGATIVE NEGATIVE mg/dL   Hgb urine dipstick NEGATIVE NEGATIVE   Bilirubin Urine NEGATIVE NEGATIVE   Ketones, ur NEGATIVE NEGATIVE mg/dL   Protein, ur NEGATIVE NEGATIVE mg/dL   Nitrite NEGATIVE NEGATIVE   Leukocytes,Ua MODERATE (A) NEGATIVE   RBC / HPF 0-5 0 - 5 RBC/hpf   WBC, UA 0-5 0 - 5 WBC/hpf   Bacteria, UA RARE (A) NONE SEEN   Squamous Epithelial / HPF 0-5 0 - 5 /HPF   Mucus PRESENT     Meds ordered this encounter  Medications   sorbitol , magnesium  hydroxide, mineral oil, glycerin  (SMOG) enema   scopolamine  (TRANSDERM-SCOP) 1 MG/3DAYS 1.5 mg   ondansetron  (ZOFRAN -ODT) disintegrating tablet 4 mg     MDM Prenatal records from community office reviewed. Pregnancy complicated by history of preterm delivery at 35 weeks. Labs ordered and reviewed.   No contractions noted on toco Zofran  & Scop patch for nausea SMOG enema- patient received half before having a bowel movement. Feels better after. Pain relieved.  Stable for discharge Constipation prevention measures reviewed. Nausea meds sent  to pharmacy.   Assessment and Plan   1. Constipation during pregnancy in third trimester   2. [redacted] weeks gestation of pregnancy   3. Abdominal pain affecting pregnancy     -Discharge home in stable condition -Rx for Scopolamine  patch, Zofran , & Colace -Return precautions discussed -Patient advised to follow-up with Ob provider tomorrow for regularly scheduled care -Patient may return to MAU as needed or if her condition were to change or worsen   Elenor Mole, [facility] 12/21/2023, 3:26 PM

## 2023-12-21 NOTE — MAU Note (Addendum)
 Demia Faires is a 23 y.o. at [redacted]w[redacted]d here in MAU reporting: been having really bad pain, feels like contractions, ? Darol Irving.  Started 4 days ago, getting closer and stronger. Are like 3 min a part.  No bleeding , ? Leaking.  Has been feeling some stuff like up.  Reports +fm. Has been having back pain, ? May have pyelo again, first noted 3 wks ago. Increased freq and urge, no pain with urination. Neg CVA.  Onset of complaint: 4 days ago Pain score: 8 Vitals:   12/21/23 1318  BP: 109/63  Pulse: 93  Resp: 17  Temp: 98.6 F (37 C)  SpO2: 100%     FHT:130 Lab orders placed from triage:  urine

## 2023-12-21 NOTE — Discharge Instructions (Signed)
 You have constipation which is hard stools that are difficult to pass. It is important to have regular bowel movements every 1-3 days that are soft and easy to pass. Hard stools increase your risk of hemorrhoids and are very uncomfortable.   To prevent constipation you can increase the amount of fiber in your diet. Examples of foods with fiber are leafy greens, whole grain breads, oatmeal and other grains.  It is also important to drink at least eight 8oz glass of water everyday.   If you have not has a bowel movement in 4-5 days you made need to clean out your bowel.  This will have establish normal movement through your bowel.    Miralax Clean out  Take 8 capfuls of miralax in 64 oz of gatorade. You can use any fluid that appeals to you (gatorade, water, juice)  Continue to drink at least eight 8 oz glasses of water throughout the day  You can repeat with another 8 capfuls of miralax in 64 oz of gatorade if you are not having a large amount of stools  You will need to be at home and close to a bathroom for about 8 hours when you do the above as you may need to go to the bathroom frequently.   After you are cleaned out: - Start Colace100mg  twice daily - Start Miralax once daily - Start a daily fiber supplement like metamucil or citrucel - You can safely use enemas in pregnancy  - if you are having diarrhea you can reduce to Colace once a day or miralax every other day or a 1/2 capful daily.

## 2023-12-22 ENCOUNTER — Ambulatory Visit (INDEPENDENT_AMBULATORY_CARE_PROVIDER_SITE_OTHER): Admitting: Obstetrics

## 2023-12-22 VITALS — BP 96/63 | HR 89 | Wt 151.0 lb

## 2023-12-22 DIAGNOSIS — O0990 Supervision of high risk pregnancy, unspecified, unspecified trimester: Secondary | ICD-10-CM | POA: Diagnosis not present

## 2023-12-22 DIAGNOSIS — Z8759 Personal history of other complications of pregnancy, childbirth and the puerperium: Secondary | ICD-10-CM

## 2023-12-22 DIAGNOSIS — O09899 Supervision of other high risk pregnancies, unspecified trimester: Secondary | ICD-10-CM | POA: Diagnosis not present

## 2023-12-22 DIAGNOSIS — D563 Thalassemia minor: Secondary | ICD-10-CM | POA: Diagnosis not present

## 2023-12-22 NOTE — Progress Notes (Signed)
 Subjective:  Amanda Davies is a 23 y.o. H1E5783 at [redacted]w[redacted]d being seen today for ongoing prenatal care.  She is currently monitored for the following issues for this high-risk pregnancy and has H/O preterm delivery, currently pregnant; Alpha thalassemia silent carrier; History of prior pregnancy with SGA newborn; Normal labor and delivery; and Supervision of high risk pregnancy, antepartum on their problem list.  Patient reports heartburn.  Contractions: Not present. Vag. Bleeding: None.  Movement: Present. Denies leaking of fluid.   The following portions of the patient's history were reviewed and updated as appropriate: allergies, current medications, past family history, past medical history, past social history, past surgical history and problem list. Problem list updated.  Objective:   Vitals:   12/22/23 1120  BP: 96/63  Pulse: 89  Weight: 151 lb (68.5 kg)    Fetal Status:     Movement: Present     General:  Alert, oriented and cooperative. Patient is in no acute distress.  Skin: Skin is warm and dry. No rash noted.   Cardiovascular: Normal heart rate noted  Respiratory: Normal respiratory effort, no problems with respiration noted  Abdomen: Soft, gravid, appropriate for gestational age. Pain/Pressure: Absent     Pelvic:  Cervical exam deferred        Extremities: Normal range of motion.  Edema: None  Mental Status: Normal mood and affect. Normal behavior. Normal judgment and thought content.   Urinalysis:      Assessment and Plan:  Pregnancy: H1E5783 at [redacted]w[redacted]d  1. Supervision of high risk pregnancy, antepartum (Primary)  2. History of prior pregnancy with SGA newborn  3. H/O preterm delivery, currently pregnant  4. Alpha thalassemia silent carrier   Preterm labor symptoms and general obstetric precautions including but not limited to vaginal bleeding, contractions, leaking of fluid and fetal movement were reviewed in detail with the patient. Please refer to After Visit  Summary for other counseling recommendations.   Return in about 2 weeks (around 01/05/2024) for ROB.   Rudy Carlin LABOR, MD 12/22/2023

## 2023-12-22 NOTE — Progress Notes (Signed)
 Pt presents for rob. Pt has no questions or concerns at this time.

## 2023-12-23 LAB — CULTURE, OB URINE: Culture: NO GROWTH

## 2024-01-08 ENCOUNTER — Encounter: Admitting: Obstetrics

## 2024-01-20 ENCOUNTER — Ambulatory Visit (INDEPENDENT_AMBULATORY_CARE_PROVIDER_SITE_OTHER): Admitting: Obstetrics and Gynecology

## 2024-01-20 VITALS — BP 107/65 | HR 103 | Wt 148.0 lb

## 2024-01-20 DIAGNOSIS — O0990 Supervision of high risk pregnancy, unspecified, unspecified trimester: Secondary | ICD-10-CM | POA: Diagnosis not present

## 2024-01-20 DIAGNOSIS — Z3493 Encounter for supervision of normal pregnancy, unspecified, third trimester: Secondary | ICD-10-CM | POA: Diagnosis not present

## 2024-01-20 DIAGNOSIS — O09899 Supervision of other high risk pregnancies, unspecified trimester: Secondary | ICD-10-CM

## 2024-01-20 DIAGNOSIS — Z8759 Personal history of other complications of pregnancy, childbirth and the puerperium: Secondary | ICD-10-CM

## 2024-01-20 DIAGNOSIS — Z3A35 35 weeks gestation of pregnancy: Secondary | ICD-10-CM

## 2024-01-20 DIAGNOSIS — D563 Thalassemia minor: Secondary | ICD-10-CM | POA: Diagnosis not present

## 2024-01-20 NOTE — Progress Notes (Signed)
 Pt presents for ROB visit. Pt c/o pain in the thighs, vaginal pressure and contractions. Requesting refill of zofran    Pt has had care gap and has concerns about iron levels

## 2024-01-20 NOTE — Progress Notes (Signed)
   PRENATAL VISIT NOTE  Subjective:  Amanda Davies is a 23 y.o. H1E5783 at [redacted]w[redacted]d being seen today for ongoing prenatal care.  She is currently monitored for the following issues for this high-risk pregnancy and has H/O preterm delivery, currently pregnant; Alpha thalassemia silent carrier; History of prior pregnancy with SGA newborn; and Supervision of high risk pregnancy, antepartum on their problem list.  Patient reports pelvic pain and back pain, hard to walk reports she talked to her mangement about going out on leave until after the baby and she just needs a note from provider.  Contractions: Irregular. Vag. Bleeding: None.  Movement: Present. Denies leaking of fluid.   The following portions of the patient's history were reviewed and updated as appropriate: allergies, current medications, past family history, past medical history, past social history, past surgical history and problem list.   Objective:    Vitals:   01/20/24 1400  BP: 107/65  Pulse: (!) 103  Weight: 148 lb (67.1 kg)    Fetal Status:  Fetal Heart Rate (bpm): 144 Fundal Height: 35 cm Movement: Present    General: Alert, oriented and cooperative. Patient is in no acute distress.  Skin: Skin is warm and dry. No rash noted.   Cardiovascular: Normal heart rate noted  Respiratory: Normal respiratory effort, no problems with respiration noted  Abdomen: Soft, gravid, appropriate for gestational age.  Pain/Pressure: Present     Pelvic: Cervical exam deferred        Extremities: Normal range of motion.  Edema: None  Mental Status: Normal mood and affect. Normal behavior. Normal judgment and thought content.   Assessment and Plan:  Pregnancy: H1E5783 at [redacted]w[redacted]d 1. Supervision of high risk pregnancy, antepartum (Primary) BP and FHR normal Doing well, feeling regular movement    2. History of prior pregnancy with SGA newborn  Previous deliveries   3. H/O preterm delivery, currently pregnant 1st baby and 3rd baby  delivered at 35 weeks   4. Alpha thalassemia silent carrier   5. [redacted] weeks gestation of pregnancy Third trimester labs completed today Declined flu vaccine Desires TDAP next visit Anticipatory guidance regarding swabs next visit Declines GTT as well as fingersticks discussed risk not knowing about GDM, obtaining a1c - CBC - HIV Antibody (routine testing w rflx) - RPR - HgB A1c   Preterm labor symptoms and general obstetric precautions including but not limited to vaginal bleeding, contractions, leaking of fluid and fetal movement were reviewed in detail with the patient. Please refer to After Visit Summary for other counseling recommendations.   Return in about 1 week (around 01/27/2024) for OB VISIT (MD or APP).  Future Appointments  Date Time Provider Department Center  01/28/2024  1:50 PM Delores Nidia CROME, FNP [facility] None  02/03/2024  9:00 AM [facility] PROVIDER 1 [facility]  02/03/2024  9:30 AM [facility] US2 [facility]    Nidia Delores, FNP

## 2024-01-21 ENCOUNTER — Ambulatory Visit: Payer: Self-pay | Admitting: Obstetrics and Gynecology

## 2024-01-21 DIAGNOSIS — O09299 Supervision of pregnancy with other poor reproductive or obstetric history, unspecified trimester: Secondary | ICD-10-CM | POA: Insufficient documentation

## 2024-01-21 LAB — HEMOGLOBIN A1C
Est. average glucose Bld gHb Est-mCnc: 103 mg/dL
Hgb A1c MFr Bld: 5.2 % (ref 4.8–5.6)

## 2024-01-21 LAB — CBC
Hematocrit: 38.1 % (ref 34.0–46.6)
Hemoglobin: 11.7 g/dL (ref 11.1–15.9)
MCH: 23.8 pg — ABNORMAL LOW (ref 26.6–33.0)
MCHC: 30.7 g/dL — ABNORMAL LOW (ref 31.5–35.7)
MCV: 77 fL — ABNORMAL LOW (ref 79–97)
Platelets: 219 x10E3/uL (ref 150–450)
RBC: 4.92 x10E6/uL (ref 3.77–5.28)
RDW: 13.9 % (ref 11.7–15.4)
WBC: 7 x10E3/uL (ref 3.4–10.8)

## 2024-01-21 LAB — HIV ANTIBODY (ROUTINE TESTING W REFLEX): HIV Screen 4th Generation wRfx: NONREACTIVE

## 2024-01-21 LAB — RPR: RPR Ser Ql: NONREACTIVE

## 2024-01-22 ENCOUNTER — Encounter (HOSPITAL_COMMUNITY): Payer: Self-pay | Admitting: Obstetrics & Gynecology

## 2024-01-22 ENCOUNTER — Other Ambulatory Visit: Payer: Self-pay

## 2024-01-22 ENCOUNTER — Other Ambulatory Visit: Payer: Self-pay | Admitting: Nurse Practitioner

## 2024-01-22 ENCOUNTER — Inpatient Hospital Stay (HOSPITAL_COMMUNITY)
Admission: AD | Admit: 2024-01-22 | Discharge: 2024-01-22 | Disposition: A | Discharge status: Home / Self Care | Attending: Obstetrics & Gynecology | Admitting: Obstetrics & Gynecology

## 2024-01-22 DIAGNOSIS — O479 False labor, unspecified: Secondary | ICD-10-CM | POA: Diagnosis not present

## 2024-01-22 DIAGNOSIS — O219 Vomiting of pregnancy, unspecified: Secondary | ICD-10-CM

## 2024-01-22 DIAGNOSIS — E876 Hypokalemia: Secondary | ICD-10-CM | POA: Diagnosis not present

## 2024-01-22 DIAGNOSIS — O99283 Endocrine, nutritional and metabolic diseases complicating pregnancy, third trimester: Secondary | ICD-10-CM | POA: Insufficient documentation

## 2024-01-22 DIAGNOSIS — O4703 False labor before 37 completed weeks of gestation, third trimester: Secondary | ICD-10-CM | POA: Insufficient documentation

## 2024-01-22 DIAGNOSIS — Z3689 Encounter for other specified antenatal screening: Secondary | ICD-10-CM

## 2024-01-22 DIAGNOSIS — Z3A35 35 weeks gestation of pregnancy: Secondary | ICD-10-CM | POA: Diagnosis not present

## 2024-01-22 LAB — COMPREHENSIVE METABOLIC PANEL WITH GFR
ALT: 9 U/L (ref 0–44)
AST: 15 U/L (ref 15–41)
Albumin: 2.5 g/dL — ABNORMAL LOW (ref 3.5–5.0)
Alkaline Phosphatase: 141 U/L — ABNORMAL HIGH (ref 38–126)
Anion gap: 9 (ref 5–15)
BUN: <5 mg/dL — ABNORMAL LOW (ref 6–20)
CO2: 26 mmol/L (ref 22–32)
Calcium: 9 mg/dL (ref 8.9–10.3)
Chloride: 100 mmol/L (ref 98–111)
Creatinine, Ser: 0.66 mg/dL (ref 0.44–1.00)
GFR, Estimated: >60 mL/min (ref >60)
Glucose, Bld: 94 mg/dL (ref 70–99)
Potassium: 3 mmol/L — ABNORMAL LOW (ref 3.5–5.1)
Sodium: 135 mmol/L (ref 135–145)
Total Bilirubin: 0.9 mg/dL (ref 0.0–1.2)
Total Protein: 6.4 g/dL — ABNORMAL LOW (ref 6.5–8.1)

## 2024-01-22 LAB — URINALYSIS, ROUTINE W REFLEX MICROSCOPIC
Bilirubin Urine: NEGATIVE
Glucose, UA: NEGATIVE mg/dL
Hgb urine dipstick: NEGATIVE
Ketones, ur: NEGATIVE mg/dL
Leukocytes,Ua: NEGATIVE
Nitrite: NEGATIVE
Protein, ur: NEGATIVE mg/dL
Specific Gravity, Urine: 1.015 (ref 1.005–1.030)
pH: 7 (ref 5.0–8.0)

## 2024-01-22 LAB — CBC
HCT: 37.2 % (ref 36.0–46.0)
Hemoglobin: 11.6 g/dL — ABNORMAL LOW (ref 12.0–15.0)
MCH: 23.4 pg — ABNORMAL LOW (ref 26.0–34.0)
MCHC: 31.2 g/dL (ref 30.0–36.0)
MCV: 75.2 fL — ABNORMAL LOW (ref 80.0–100.0)
Platelets: 218 K/uL (ref 150–400)
RBC: 4.95 MIL/uL (ref 3.87–5.11)
RDW: 14.7 % (ref 11.5–15.5)
WBC: 7.5 K/uL (ref 4.0–10.5)
nRBC: 0 % (ref 0.0–0.2)

## 2024-01-22 LAB — MAGNESIUM: Magnesium: 1.8 mg/dL (ref 1.7–2.4)

## 2024-01-22 MED ORDER — POTASSIUM CHLORIDE CRYS ER 20 MEQ PO TBCR: 40.0000 meq | EXTENDED_RELEASE_TABLET | Freq: Every day | ORAL | 0 refills | Status: DC

## 2024-01-22 MED ORDER — ONDANSETRON HCL 4 MG/2ML IJ SOLN
4.0000 mg | Freq: Once | INTRAMUSCULAR | Status: AC
  Administered 2024-01-22: 4 mg via INTRAVENOUS
  Filled 2024-01-22: qty 2

## 2024-01-22 MED ORDER — ONDANSETRON HCL 4 MG PO TABS: 8.0000 mg | ORAL_TABLET | Freq: Two times a day (BID) | ORAL | 0 refills | Status: DC

## 2024-01-22 MED ORDER — FAMOTIDINE IN NACL 20-0.9 MG/50ML-% IV SOLN
20.0000 mg | Freq: Once | INTRAVENOUS | Status: AC
  Administered 2024-01-22: 20 mg via INTRAVENOUS
  Filled 2024-01-22: qty 50

## 2024-01-22 MED ORDER — LACTATED RINGERS IV BOLUS
1000.0000 mL | Freq: Once | INTRAVENOUS | Status: AC
  Administered 2024-01-22: 1000 mL via INTRAVENOUS

## 2024-01-22 MED ORDER — METOCLOPRAMIDE HCL 10 MG PO TABS: 10.0000 mg | ORAL_TABLET | Freq: Four times a day (QID) | ORAL | 0 refills | Status: DC | PRN

## 2024-01-22 MED ORDER — FAMOTIDINE 20 MG PO TABS: 20.0000 mg | ORAL_TABLET | Freq: Two times a day (BID) | ORAL | 0 refills | Status: DC

## 2024-01-22 NOTE — MAU Note (Signed)
 Amanda Davies is a 23 y.o. at [redacted]w[redacted]d here in MAU reporting: she's having ongoing ctxs but have worsened today.  States she's also been vomiting for the past two days, reports can't keep anything down.  Denies VB or LOF.  Endorses +FM.  LMP: NA Onset of complaint: 2 days ago Pain score: 8 Vitals:   01/22/24 1419  BP: 101/67  Pulse: 95  Resp: 18  Temp: 98 F (36.7 C)  SpO2: 100%     FHT: 140 bpm  Lab orders placed from triage: UA

## 2024-01-22 NOTE — MAU Provider Note (Addendum)
 Chief Complaint:  Contractions, Nausea, and Emesis   HPI   Patient states she has not been able to keep down solids or liquids for a few days now. She does not know how many times a day she has thrown up, but states she is throwing up about every other hour. She feels an acidic taste in her mouth. She states she has heartburn and is having it right now. She does not take anything at home for heartburn. She is nauseous, but has not taken anything recently. She has dealt with nausea throughout her pregnancy and zofran  has helped her. However, she last took it 1 week ago and stated she did not feel like it helped as much. She is having pain at the top of her stomach and across the bottom. She denies constipation. Her last bowel movement was yesterday. She states it was not hard, but it was a small amount.    Additional history obtained from mother  Pregnancy Course: Receives care at [facility] for [employer] . Prenatal records reviewed.   Past Medical History:  Diagnosis Date   Acne    Asthma    when younger   Chlamydia    Encounter for vaginal delivery 12/13/2020   GBS bacteriuria 04/25/2019   Hx of pre-term labor    Insufficient prenatal care in third trimester 07/21/2019   PROM (premature rupture of membranes) 09/10/2019   Supervision of high risk pregnancy, antepartum 11/27/2016          Nursing Staff  Provider  Office Location   [facility]  Dating   Unsure LMP  Language   English  Anatomy US    ?growth restriction?, Dopplers 3.12, anatomy normal, f/u 3 weeks  Flu Vaccine   declined  Genetic Screen   NIPS: low risk   AFP:   Not done      TDaP vaccine    declined  Hgb A1C or   GTT  Early   Third trimester   Rhogam    NA     LAB RESULTS   Feeding Plan   Breast  Blood Type  O/Posit   Supervision of normal pregnancy, antepartum 06/26/2020          Nursing Staff  Provider  Office Location   [facility]   Dating      Language   English   Anatomy US       Flu Vaccine   No   Genetic Screen   NIPS:    AFP:   First Screen:  Quad:      TDaP Vaccine    Declined   Hgb A1C or   GTT  Early   Third trimester   COVID Vaccine   No     LAB RESULTS   Rhogam      Blood Type  O/Positive/-- (03/15 1523)   Feeding Plan  Both   Antibody  Negative (03/15 1523)     OB History  Gravida Para Term Preterm AB Living  8 6 4 2 1 6   SAB IAB Ectopic Multiple Live Births  1   0 6    # Outcome Date GA Lbr Len/2nd Weight Sex Type Anes PTL Lv  8 Current           7 Term 10/15/22 [redacted]w[redacted]d 13:52 / 00:29 2480 g M Vag-Spont EPI  LIV  6 Term 12/13/20 [redacted]w[redacted]d  2327 g M Vag-Spont None  LIV  5 Term 09/10/19 [redacted]w[redacted]d 06:13 / 00:13 2679 g F Vag-Spont EPI  LIV  4 Preterm 07/09/18 [redacted]w[redacted]d / 00:16 2160 g F Vag-Spont EPI  LIV  3 Term 04/23/17 [redacted]w[redacted]d 05:15 / 01:33 2234 g F Vag-Spont EPI  LIV     Birth Comments: n/a  2 Preterm 12/30/15 [redacted]w[redacted]d 12:22 / 00:31 2130 g F Vag-Spont EPI  LIV  1 SAB 2016           Past Surgical History:  Procedure Laterality Date   NO PAST SURGERIES     Family History  Problem Relation Age of Onset   Anxiety disorder Mother    Asthma Sister    Asthma Brother    Social History   Tobacco Use   Smoking status: Never   Smokeless tobacco: Never  Vaping Use   Vaping status: Never Used  Substance Use Topics   Alcohol use: No   Drug use: No   No Known Allergies Medications Prior to Admission  Medication Sig Dispense Refill Last Dose/Taking   acetaminophen  (TYLENOL ) 325 MG tablet Take 2 tablets (650 mg total) by mouth every 4 (four) hours as needed (for pain scale < 4). 100 tablet 0 Past Week   docusate sodium  (COLACE) 100 MG capsule Take 1 capsule (100 mg total) by mouth 2 (two) times daily. 60 capsule 0 Past Week   ondansetron  (ZOFRAN -ODT) 4 MG disintegrating tablet Take 1 tablet (4 mg total) by mouth every 8 (eight) hours as needed for nausea or vomiting. 30 tablet 0 Past Week   scopolamine  (TRANSDERM-SCOP) 1 MG/3DAYS Place 1 patch (1.5 mg total) onto the skin every 3 (three) days. 10 patch 1 Past Week    aspirin  EC 81 MG tablet Take 1 tablet (81 mg total) by mouth daily. Take after 12 weeks for prevention of preeclampsia later in pregnancy (Patient not taking: Reported on 01/20/2024) 300 tablet 2    Blood Pressure Monitoring (BLOOD PRESSURE KIT) DEVI 1 kit by Does not apply route once a week. 1 each 0    ferrous sulfate  325 (65 FE) MG tablet Take 1 tablet (325 mg total) by mouth every other day. 30 tablet 0    Misc. Devices (GOJJI WEIGHT SCALE) MISC 1 Device by Does not apply route every 30 (thirty) days. 1 each 0    ondansetron  (ZOFRAN -ODT) 4 MG disintegrating tablet Take 1 tablet (4 mg total) by mouth every 8 (eight) hours as needed for nausea or vomiting. (Patient not taking: Reported on 01/20/2024) 15 tablet 0    Prenatal Vit-Fe Fumarate-FA (PREPLUS) 27-1 MG TABS Take 1 tablet by mouth daily. (Patient not taking: Reported on 12/22/2023) 30 tablet 13     I have reviewed patient's Past Medical Hx, Surgical Hx, Family Hx, Social Hx, medications and allergies.   ROS  Pertinent items noted in HPI and remainder of comprehensive ROS otherwise negative.   PHYSICAL EXAM  Patient Vitals for the past 24 hrs:  BP Temp Temp src Pulse Resp SpO2 Height Weight  01/22/24 1439 101/68 -- -- 97 -- -- -- --  01/22/24 1419 101/67 98 F (36.7 C) Oral 95 18 100 % -- --  01/22/24 1414 -- -- -- -- -- -- 5' 2 (1.575 m) 67.8 kg    Constitutional: Well-developed, well-nourished female in no acute distress.  HEENT: atraumatic, normocephalic. Neck has normal ROM. EOM intact. Cardiovascular: normal rate & rhythm, warm and well-perfused Respiratory: normal effort, no problems with respiration noted GI: Abd soft, non-tender, non-distended MSK: Extremities nontender, no edema, normal ROM Skin: warm and dry. Acyanotic, no jaundice or pallor. Neurologic:  Alert and oriented x 4. No abnormal coordination. Psychiatric: Normal mood. Speech not slurred, not rapid/pressured. Patient is cooperative.   Fetal  Tracing: Baseline FHR: 130 per minute Fetal heart variability: moderate Fetal Heart Rate accelerations: yes Fetal Heart Rate decelerations: none Fetal Non-stress Test: Category I (reactive) Toco: irregular    Labs: Results for orders placed or performed during the hospital encounter of 01/22/24 (from the past 24 hours)  Urinalysis, Routine w reflex microscopic -Urine, Clean Catch     Status: None   Collection Time: 01/22/24  3:22 PM  Result Value Ref Range   Color, Urine YELLOW YELLOW   APPearance CLEAR CLEAR   Specific Gravity, Urine 1.015 1.005 - 1.030   pH 7.0 5.0 - 8.0   Glucose, UA NEGATIVE NEGATIVE mg/dL   Hgb urine dipstick NEGATIVE NEGATIVE   Bilirubin Urine NEGATIVE NEGATIVE   Ketones, ur NEGATIVE NEGATIVE mg/dL   Protein, ur NEGATIVE NEGATIVE mg/dL   Nitrite NEGATIVE NEGATIVE   Leukocytes,Ua NEGATIVE NEGATIVE  CBC     Status: Abnormal   Collection Time: 01/22/24  3:30 PM  Result Value Ref Range   WBC 7.5 4.0 - 10.5 K/uL   RBC 4.95 3.87 - 5.11 MIL/uL   Hemoglobin 11.6 (L) 12.0 - 15.0 g/dL   HCT 62.7 63.9 - 53.9 %   MCV 75.2 (L) 80.0 - 100.0 fL   MCH 23.4 (L) 26.0 - 34.0 pg   MCHC 31.2 30.0 - 36.0 g/dL   RDW 85.2 88.4 - 84.4 %   Platelets 218 150 - 400 K/uL   nRBC 0.0 0.0 - 0.2 %  Comprehensive metabolic panel     Status: Abnormal   Collection Time: 01/22/24  3:30 PM  Result Value Ref Range   Sodium 135 135 - 145 mmol/L   Potassium 3.0 (L) 3.5 - 5.1 mmol/L   Chloride 100 98 - 111 mmol/L   CO2 26 22 - 32 mmol/L   Glucose, Bld 94 70 - 99 mg/dL   BUN <5 (L) 6 - 20 mg/dL   Creatinine, Ser 9.33 0.44 - 1.00 mg/dL   Calcium  9.0 8.9 - 10.3 mg/dL   Total Protein 6.4 (L) 6.5 - 8.1 g/dL   Albumin 2.5 (L) 3.5 - 5.0 g/dL   AST 15 15 - 41 U/L   ALT 9 0 - 44 U/L   Alkaline Phosphatase 141 (H) 38 - 126 U/L   Total Bilirubin 0.9 0.0 - 1.2 mg/dL   GFR, Estimated >39 >39 mL/min   Anion gap 9 5 - 15  Magnesium      Status: None   Collection Time: 01/22/24  3:30 PM   Result Value Ref Range   Magnesium  1.8 1.7 - 2.4 mg/dL    Imaging:  No results found.   MDM & MAU COURSE  MDM: High  MAU Course: Patient presented with nausea/vomiting for three days. She was treated with fluids, zofran , and pepcid  and improved. Her labs were unremarkable except for a K+ of 3.0. which supplements were prescribed at discharge.  Patient was stable for discharge home after PO Challenge was tolerated    Orders Placed This Encounter  Procedures   Urinalysis, Routine w reflex microscopic -Urine, Clean Catch   CBC   Comprehensive metabolic panel   Magnesium    Discharge patient Discharge disposition: 01-Home or Self Care; Discharge patient date: 01/22/2024   Meds ordered this encounter  Medications   lactated ringers  bolus 1,000 mL   famotidine  (PEPCID ) IVPB 20 mg premix   ondansetron  (  ZOFRAN ) injection 4 mg   potassium chloride  SA (KLOR-CON  M) 20 MEQ tablet    Sig: Take 2 tablets (40 mEq total) by mouth daily for 3 days.    Dispense:  6 tablet    Refill:  0    Supervising Provider:   PRATT, TANYA S [[id number]]   famotidine  (PEPCID ) 20 MG tablet    Sig: Take 1 tablet (20 mg total) by mouth 2 (two) times daily.    Dispense:  60 tablet    Refill:  0    Supervising Provider:   PRATT, TANYA S [[id number]]   metoCLOPramide  (REGLAN ) 10 MG tablet    Sig: Take 1 tablet (10 mg total) by mouth every 6 (six) hours as needed for nausea.    Dispense:  30 tablet    Refill:  0    Supervising Provider:   PRATT, TANYA S [[id number]]    ASSESSMENT   1. Nausea and vomiting in pregnancy   2. Hypokalemia   3. False labor   4. [redacted] weeks gestation of pregnancy   5. NST (non-stress test) reactive on fetal surveillance     PLAN  Discharge home in stable condition with return precautions.     Follow-up Information     [facility] Follow up.   Specialty: Obstetrics and Gynecology Why: If symptoms worsen or fail to resolve, As scheduled for ongoing  prenatal care Contact information: [address] [place]  [phone]                 Allergies as of 01/22/2024   No Known Allergies      Medication List     TAKE these medications    acetaminophen  325 MG tablet Commonly known as: Tylenol  Take 2 tablets (650 mg total) by mouth every 4 (four) hours as needed (for pain scale < 4).   aspirin  EC 81 MG tablet Take 1 tablet (81 mg total) by mouth daily. Take after 12 weeks for prevention of preeclampsia later in pregnancy   Blood Pressure Kit Devi 1 kit by Does not apply route once a week.   docusate sodium  100 MG capsule Commonly known as: COLACE Take 1 capsule (100 mg total) by mouth 2 (two) times daily.   famotidine  20 MG tablet Commonly known as: Pepcid  Take 1 tablet (20 mg total) by mouth 2 (two) times daily.   ferrous sulfate  325 (65 FE) MG tablet Take 1 tablet (325 mg total) by mouth every other day.   Gojji Weight Scale Misc 1 Device by Does not apply route every 30 (thirty) days.   metoCLOPramide  10 MG tablet Commonly known as: REGLAN  Take 1 tablet (10 mg total) by mouth every 6 (six) hours as needed for nausea.   ondansetron  4 MG disintegrating tablet Commonly known as: ZOFRAN -ODT Take 1 tablet (4 mg total) by mouth every 8 (eight) hours as needed for nausea or vomiting.   ondansetron  4 MG disintegrating tablet Commonly known as: ZOFRAN -ODT Take 1 tablet (4 mg total) by mouth every 8 (eight) hours as needed for nausea or vomiting.   potassium chloride  SA 20 MEQ tablet Commonly known as: KLOR-CON  M Take 2 tablets (40 mEq total) by mouth daily for 3 days.   PrePLUS 27-1 MG Tabs Take 1 tablet by mouth daily.   scopolamine  1 MG/3DAYS Commonly known as: TRANSDERM-SCOP Place 1 patch (1.5 mg total) onto the skin every 3 (three) days.  Raguel KANDICE Lee, DO    Attestation of Supervision of Student:  I confirm that I have verified the information  documented in the Resident Physician's  note and that I have also personally reperformed the history, physical exam and all medical decision making activities.  I have verified that all services and findings are accurately documented in this student's note; and I agree with management and plan as outlined in the documentation. I have also made any necessary editorial changes.  I personally confirmed the HPI and Physical exam, Interpreted the NST and lab results. A/P was discussed with me and I agree with the above discharge plan.  Olam DELENA Dalton, NP [facility] [employer], [facility] 01/22/2024 7:35 PM

## 2024-01-28 ENCOUNTER — Encounter: Admitting: Obstetrics and Gynecology

## 2024-02-01 ENCOUNTER — Encounter: Admitting: Obstetrics

## 2024-02-01 ENCOUNTER — Telehealth: Payer: Self-pay | Admitting: *Deleted

## 2024-02-01 ENCOUNTER — Other Ambulatory Visit: Payer: Self-pay | Admitting: Obstetrics and Gynecology

## 2024-02-01 NOTE — Telephone Encounter (Signed)
 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 Pt's is not covered under FMLA/ ADA/ STD due to type of work. Pt requesting update to previous out of work note. She would like the note to include that she is a high risk pregnancy. 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 Staff message sent to Nidia Daring, NP requesting update to previous work excuse letter. Standard pregnancy restriction letter placed in chart for patient.

## 2024-02-01 NOTE — Progress Notes (Unsigned)
   PRENATAL VISIT NOTE  Subjective:  Kirsta Davies is a 23 y.o. H1E5783 at [redacted]w[redacted]d being seen today for ongoing prenatal care.  She is currently monitored for the following issues for this high-risk pregnancy and has H/O preterm delivery, currently pregnant; Limited prenatal care, antepartum; Alpha thalassemia silent carrier; History of prior pregnancy with SGA newborn; Supervision of high risk pregnancy, antepartum; and History of pre-eclampsia in prior pregnancy, currently pregnant on their problem list.  Patient reports {[id number]}.   .  .   . Denies leaking of fluid.   The following portions of the patient's history were reviewed and updated as appropriate: allergies, current medications, past family history, past medical history, past social history, past surgical history and problem list.   Objective:    There were no vitals filed for this visit.  Fetal Status:           General: Alert, oriented and cooperative. Patient is in no acute distress.  Skin: Skin is warm and dry. No rash noted.   Cardiovascular: Normal heart rate noted  Respiratory: Normal respiratory effort, no problems with respiration noted  Abdomen: Soft, gravid, appropriate for gestational age.        Pelvic: Cervical exam deferred        Extremities: Normal range of motion.     Mental Status: Normal mood and affect. Normal behavior. Normal judgment and thought content.   Assessment and Plan:  Pregnancy: H1E5783 at [redacted]w[redacted]d 1. Supervision of high risk pregnancy, antepartum (Primary) - Doing well, feeling regular and vigorous fetal movement  2. [redacted] weeks gestation of pregnancy - Routine PNC, anticipatory guidance.  - Unknown GDM - declined  3. History of prior pregnancy with SGA newborn 4. H/O preterm delivery, currently pregnant - Previous deliveries.  - 1st and 3rd babies delivered at 35 weeks.   5. Alpha thalassemia silent carrier ***  Term labor symptoms and general obstetric precautions including but not  limited to vaginal bleeding, contractions, leaking of fluid and fetal movement were reviewed in detail with the patient. Please refer to After Visit Summary for other counseling recommendations.   No follow-ups on file.  Future Appointments  Date Time Provider Department Center  02/02/2024  1:30 PM Regino Camie LABOR, CNM [facility] None  02/03/2024  9:00 AM [facility] PROVIDER 1 [facility]  02/03/2024  9:30 AM [facility] US2 [facility]    Camie LABOR Regino, CNM

## 2024-02-01 NOTE — Progress Notes (Signed)
 Updated work note

## 2024-02-02 ENCOUNTER — Ambulatory Visit: Admitting: Certified Nurse Midwife

## 2024-02-02 ENCOUNTER — Other Ambulatory Visit (HOSPITAL_COMMUNITY)
Admission: RE | Admit: 2024-02-02 | Discharge: 2024-02-02 | Disposition: A | Discharge status: Home / Self Care | Source: Ambulatory Visit | Attending: Certified Nurse Midwife | Admitting: Certified Nurse Midwife

## 2024-02-02 VITALS — BP 113/67 | HR 105 | Wt 151.0 lb

## 2024-02-02 DIAGNOSIS — O0993 Supervision of high risk pregnancy, unspecified, third trimester: Secondary | ICD-10-CM | POA: Diagnosis not present

## 2024-02-02 DIAGNOSIS — D563 Thalassemia minor: Secondary | ICD-10-CM

## 2024-02-02 DIAGNOSIS — Z8759 Personal history of other complications of pregnancy, childbirth and the puerperium: Secondary | ICD-10-CM | POA: Diagnosis not present

## 2024-02-02 DIAGNOSIS — Z23 Encounter for immunization: Secondary | ICD-10-CM | POA: Diagnosis not present

## 2024-02-02 DIAGNOSIS — Z3493 Encounter for supervision of normal pregnancy, unspecified, third trimester: Secondary | ICD-10-CM | POA: Insufficient documentation

## 2024-02-02 DIAGNOSIS — O09899 Supervision of other high risk pregnancies, unspecified trimester: Secondary | ICD-10-CM

## 2024-02-02 DIAGNOSIS — Z3A37 37 weeks gestation of pregnancy: Secondary | ICD-10-CM | POA: Diagnosis not present

## 2024-02-02 DIAGNOSIS — O0990 Supervision of high risk pregnancy, unspecified, unspecified trimester: Secondary | ICD-10-CM

## 2024-02-02 NOTE — Progress Notes (Signed)
 Some irreg UC usually nite or when walking daytime. Vaginal discomfort. Denies concerns today.

## 2024-02-03 ENCOUNTER — Ambulatory Visit: Admitting: *Deleted

## 2024-02-03 ENCOUNTER — Other Ambulatory Visit

## 2024-02-03 ENCOUNTER — Ambulatory Visit: Attending: Obstetrics and Gynecology | Admitting: Obstetrics

## 2024-02-03 ENCOUNTER — Ambulatory Visit

## 2024-02-03 ENCOUNTER — Other Ambulatory Visit: Payer: Self-pay | Admitting: Obstetrics and Gynecology

## 2024-02-03 ENCOUNTER — Ambulatory Visit (HOSPITAL_BASED_OUTPATIENT_CLINIC_OR_DEPARTMENT_OTHER)

## 2024-02-03 ENCOUNTER — Telehealth (HOSPITAL_COMMUNITY): Payer: Self-pay | Admitting: *Deleted

## 2024-02-03 VITALS — BP 109/57 | HR 101

## 2024-02-03 DIAGNOSIS — Z3A37 37 weeks gestation of pregnancy: Secondary | ICD-10-CM

## 2024-02-03 DIAGNOSIS — O365930 Maternal care for other known or suspected poor fetal growth, third trimester, not applicable or unspecified: Secondary | ICD-10-CM

## 2024-02-03 DIAGNOSIS — O09299 Supervision of pregnancy with other poor reproductive or obstetric history, unspecified trimester: Secondary | ICD-10-CM

## 2024-02-03 DIAGNOSIS — O09293 Supervision of pregnancy with other poor reproductive or obstetric history, third trimester: Secondary | ICD-10-CM | POA: Diagnosis not present

## 2024-02-03 DIAGNOSIS — O0943 Supervision of pregnancy with grand multiparity, third trimester: Secondary | ICD-10-CM | POA: Diagnosis not present

## 2024-02-03 DIAGNOSIS — O09899 Supervision of other high risk pregnancies, unspecified trimester: Secondary | ICD-10-CM

## 2024-02-03 DIAGNOSIS — Z3A29 29 weeks gestation of pregnancy: Secondary | ICD-10-CM

## 2024-02-03 DIAGNOSIS — O09213 Supervision of pregnancy with history of pre-term labor, third trimester: Secondary | ICD-10-CM | POA: Diagnosis not present

## 2024-02-03 DIAGNOSIS — O0933 Supervision of pregnancy with insufficient antenatal care, third trimester: Secondary | ICD-10-CM

## 2024-02-03 DIAGNOSIS — Z8759 Personal history of other complications of pregnancy, childbirth and the puerperium: Secondary | ICD-10-CM

## 2024-02-03 DIAGNOSIS — O0930 Supervision of pregnancy with insufficient antenatal care, unspecified trimester: Secondary | ICD-10-CM

## 2024-02-03 NOTE — Progress Notes (Signed)
 OB Note Dr. Ileana sent me a message re: need for delivery at 37wks due to FGR <1%, normal UADs (91-93%) and bpp 10/10 today. Pt called and d/w her re: recommendation. She had some additional questions and I told her anytime is fine for delivery, including now. Risk with FGR, particularly IUFD, d/w her her and her mother. I also d/w them re: labor precautions and fetal kick count precautions. She'd like AM IOL for this Sunday 10/5, which I scheduled for them. I d/w them that they will be called at this number and to wait until that call before coming into the hospital.  She was also wondering about BTL. Unfortunately, she hasn't signed the 30d paperwork so I said it couldn't be done after having the baby; I didn't mention the potential to do it if she needed a c/s for some reason, but I did tell them that there is a risk of fetal intolerance of labor with the FGR. I told them that we can always sign it later and she can have it do as an interval procedure.   All questions asked and answered.  Follow up GBS swab which was collected yesterday. If not back yet, pt needs treatment since she was GBS+ last year.   Bebe Izell Raddle MD Attending [facility] [employer] (Faculty Practice) 02/03/2024 Time: [phone]

## 2024-02-03 NOTE — Progress Notes (Signed)
 MFM Consult Note  Amanda Davies is currently at 37 weeks and 1 day.  She was seen due to grand multiparity and limited prenatal care.   The patient reports that all of her other children were delivered at between 37 to 38 weeks weighing between 4 to 5 pounds.    She denies any other problems in her current pregnancy and reports feeling fetal movements throughout the day.    Sonographic findings Single intrauterine pregnancy. Fetal cardiac activity: Observed. Presentation: Cephalic. Fetal biometry shows the estimated fetal weight of 4 pounds 12 ounces which measures at at the < 1 percentile, indicating severe IUGR. Amniotic fluid: Within normal limits.  MVP: 3.63 cm. Placenta: Anterior Fundal. BPP: 10/10 with a reactive NST.  The views of the fetal anatomy were limited today due to her advanced gestational age.  There are limitations of prenatal ultrasound such as the inability to detect certain abnormalities due to poor visualization. Various factors such as fetal position, gestational age and maternal body habitus may increase the difficulty in visualizing the fetal anatomy.    Doppler studies of the umbilical arteries showed a normal S/D ratio of 3.2 .  There were no signs of absent or reversed end-diastolic flow.   Due to severe IUGR at her current gestational age, delivery is recommended within the next few days.  The patient was hesitant to get delivered this week as all of her other children were small and they did not have any issues.    The increased risk of adverse pregnancy outcomes such as an IUFD associated with severe IUGR was discussed.    As the fetal testing and umbilical artery Doppler studies were within normal limits today, she was advised the maximum time that we would delay delivery to is 38 weeks.    She is agreeable to be delivered at the end of this week to early next week.    I will help the patient make arrangements for an induction.    The patient stated  that all of her questions were answered today and is agreeable with the plan outlined above.  A total of 30 minutes was spent counseling and coordinating the care for this patient.  Greater than 50% of the time was spent in direct face-to-face contact.

## 2024-02-03 NOTE — Procedures (Signed)
 Amanda Davies [birth year] [redacted]w[redacted]d  Fetus A Non-Stress Test Interpretation for 02/03/24  Indication: IUGR  Fetal Heart Rate A Mode: External Baseline Rate (A): 125 bpm Variability: Moderate Accelerations: 15 x 15 Decelerations: None Multiple birth?: No  Uterine Activity Mode: Palpation, Toco Contraction Frequency (min): none Resting Tone Palpated: Relaxed  Interpretation (Fetal Testing) Nonstress Test Interpretation: Reactive Overall Impression: Reassuring for gestational age Comments: Dr. Ileana reviewed tracing

## 2024-02-03 NOTE — Telephone Encounter (Signed)
 Preadmission screen

## 2024-02-04 ENCOUNTER — Encounter (HOSPITAL_COMMUNITY): Payer: Self-pay | Admitting: Obstetrics and Gynecology

## 2024-02-04 ENCOUNTER — Other Ambulatory Visit: Payer: Self-pay

## 2024-02-04 ENCOUNTER — Inpatient Hospital Stay (HOSPITAL_COMMUNITY)
Admission: AD | Admit: 2024-02-04 | Discharge: 2024-02-07 | DRG: 807 | Disposition: A | Discharge status: Home / Self Care | Attending: Obstetrics and Gynecology | Admitting: Obstetrics and Gynecology

## 2024-02-04 DIAGNOSIS — O9962 Diseases of the digestive system complicating childbirth: Secondary | ICD-10-CM | POA: Diagnosis present

## 2024-02-04 DIAGNOSIS — O365990 Maternal care for other known or suspected poor fetal growth, unspecified trimester, not applicable or unspecified: Principal | ICD-10-CM | POA: Diagnosis present

## 2024-02-04 DIAGNOSIS — O9902 Anemia complicating childbirth: Secondary | ICD-10-CM | POA: Diagnosis present

## 2024-02-04 DIAGNOSIS — O99820 Streptococcus B carrier state complicating pregnancy: Secondary | ICD-10-CM | POA: Diagnosis not present

## 2024-02-04 DIAGNOSIS — K219 Gastro-esophageal reflux disease without esophagitis: Secondary | ICD-10-CM | POA: Diagnosis present

## 2024-02-04 DIAGNOSIS — O365930 Maternal care for other known or suspected poor fetal growth, third trimester, not applicable or unspecified: Principal | ICD-10-CM | POA: Diagnosis present

## 2024-02-04 DIAGNOSIS — O1414 Severe pre-eclampsia complicating childbirth: Secondary | ICD-10-CM | POA: Diagnosis not present

## 2024-02-04 DIAGNOSIS — O99824 Streptococcus B carrier state complicating childbirth: Secondary | ICD-10-CM | POA: Diagnosis present

## 2024-02-04 DIAGNOSIS — Z3A37 37 weeks gestation of pregnancy: Secondary | ICD-10-CM

## 2024-02-04 LAB — CBC
HCT: 34.5 % — ABNORMAL LOW (ref 36.0–46.0)
Hemoglobin: 10.7 g/dL — ABNORMAL LOW (ref 12.0–15.0)
MCH: 23.1 pg — ABNORMAL LOW (ref 26.0–34.0)
MCHC: 31 g/dL (ref 30.0–36.0)
MCV: 74.4 fL — ABNORMAL LOW (ref 80.0–100.0)
Platelets: 235 K/uL (ref 150–400)
RBC: 4.64 MIL/uL (ref 3.87–5.11)
RDW: 14.8 % (ref 11.5–15.5)
WBC: 9.9 K/uL (ref 4.0–10.5)
nRBC: 0 % (ref 0.0–0.2)

## 2024-02-04 LAB — CERVICOVAGINAL ANCILLARY ONLY
Bacterial Vaginitis (gardnerella): NEGATIVE
Chlamydia: NEGATIVE
Comment: NEGATIVE
Comment: NEGATIVE
Comment: NEGATIVE
Comment: NORMAL
Neisseria Gonorrhea: NEGATIVE
Trichomonas: NEGATIVE

## 2024-02-04 MED ORDER — SODIUM CHLORIDE 0.9 % IV SOLN
5.0000 10*6.[IU] | Freq: Once | INTRAVENOUS | Status: DC
  Filled 2024-02-04: qty 5

## 2024-02-04 MED ORDER — LACTATED RINGERS IV SOLN: INTRAVENOUS | Status: DC

## 2024-02-04 MED ORDER — ACETAMINOPHEN 325 MG PO TABS: 650.0000 mg | ORAL_TABLET | ORAL | Status: DC | PRN

## 2024-02-04 MED ORDER — SODIUM CHLORIDE 0.9 % IV SOLN
5.0000 10*6.[IU] | Freq: Once | INTRAVENOUS | Status: AC
  Administered 2024-02-04: 5 10*6.[IU] via INTRAVENOUS

## 2024-02-04 MED ORDER — LACTATED RINGERS IV SOLN: 500.0000 mL | INTRAVENOUS | Status: DC | PRN

## 2024-02-04 MED ORDER — ONDANSETRON HCL 4 MG/2ML IJ SOLN: 4.0000 mg | Freq: Four times a day (QID) | INTRAMUSCULAR | Status: DC | PRN

## 2024-02-04 MED ORDER — PENICILLIN G POT IN DEXTROSE 60000 UNIT/ML IV SOLN: 3.0000 10*6.[IU] | INTRAVENOUS | Status: DC

## 2024-02-04 MED ORDER — FENTANYL CITRATE (PF) 100 MCG/2ML IJ SOLN: 50.0000 ug | INTRAMUSCULAR | Status: DC | PRN

## 2024-02-04 MED ORDER — OXYTOCIN BOLUS FROM INFUSION
333.0000 mL | Freq: Once | INTRAVENOUS | Status: AC
  Administered 2024-02-05: 333 mL via INTRAVENOUS

## 2024-02-04 MED ORDER — SOD CITRATE-CITRIC ACID 500-334 MG/5ML PO SOLN: 30.0000 mL | ORAL | Status: DC | PRN

## 2024-02-04 MED ORDER — OXYTOCIN-SODIUM CHLORIDE 30-0.9 UT/500ML-% IV SOLN
2.5000 [IU]/h | INTRAVENOUS | Status: DC
  Filled 2024-02-04: qty 500

## 2024-02-04 MED ORDER — LIDOCAINE HCL (PF) 1 % IJ SOLN: 30.0000 mL | INTRAMUSCULAR | Status: DC | PRN

## 2024-02-04 NOTE — MAU Note (Signed)
 Amanda Davies is a 23 y.o. at [redacted]w[redacted]d here in MAU reporting ctxs since 1500. Denies LOF or VB. Reports good FM  LMP: na Onset of complaint: 1500 Pain score: 8 Vitals:   02/04/24 2118  Pulse: 81  Resp: 17  Temp: 98.6 F (37 C)  SpO2: 100%     FHT: 122  Lab orders placed from triage: labor eval

## 2024-02-05 ENCOUNTER — Inpatient Hospital Stay (HOSPITAL_COMMUNITY): Admitting: Anesthesiology

## 2024-02-05 ENCOUNTER — Ambulatory Visit: Payer: Self-pay | Admitting: Certified Nurse Midwife

## 2024-02-05 ENCOUNTER — Encounter (HOSPITAL_COMMUNITY): Payer: Self-pay | Admitting: Obstetrics and Gynecology

## 2024-02-05 DIAGNOSIS — O1414 Severe pre-eclampsia complicating childbirth: Secondary | ICD-10-CM

## 2024-02-05 DIAGNOSIS — O365930 Maternal care for other known or suspected poor fetal growth, third trimester, not applicable or unspecified: Secondary | ICD-10-CM

## 2024-02-05 DIAGNOSIS — O99820 Streptococcus B carrier state complicating pregnancy: Secondary | ICD-10-CM

## 2024-02-05 DIAGNOSIS — Z3A37 37 weeks gestation of pregnancy: Secondary | ICD-10-CM

## 2024-02-05 LAB — POCT FERN TEST: POCT Fern Test: NEGATIVE

## 2024-02-05 LAB — RPR: RPR Ser Ql: NONREACTIVE

## 2024-02-05 LAB — CULTURE, BETA STREP (GROUP B ONLY): Strep Gp B Culture: POSITIVE — AB

## 2024-02-05 LAB — TYPE AND SCREEN
ABO/RH(D): O POS
Antibody Screen: NEGATIVE

## 2024-02-05 LAB — GROUP B STREP BY PCR: Group B strep by PCR: POSITIVE — AB

## 2024-02-05 MED ORDER — BENZOCAINE-MENTHOL 20-0.5 % EX AERO: 1.0000 | INHALATION_SPRAY | CUTANEOUS | Status: DC | PRN

## 2024-02-05 MED ORDER — MEDROXYPROGESTERONE ACETATE 150 MG/ML IM SUSP
150.0000 mg | INTRAMUSCULAR | Status: AC | PRN
  Administered 2024-02-07: 150 mg via INTRAMUSCULAR
  Filled 2024-02-05: qty 1

## 2024-02-05 MED ORDER — FLEET ENEMA RE ENEM: 1.0000 | ENEMA | Freq: Every day | RECTAL | Status: DC | PRN

## 2024-02-05 MED ORDER — ACETAMINOPHEN 325 MG PO TABS
650.0000 mg | ORAL_TABLET | ORAL | Status: DC | PRN
  Administered 2024-02-05 – 2024-02-07 (×6): 650 mg via ORAL
  Filled 2024-02-05 (×6): qty 2

## 2024-02-05 MED ORDER — TETANUS-DIPHTH-ACELL PERTUSSIS 5-2-15.5 LF-MCG/0.5 IM SUSP: 0.5000 mL | Freq: Once | INTRAMUSCULAR | Status: DC

## 2024-02-05 MED ORDER — WITCH HAZEL-GLYCERIN EX PADS: 1.0000 | MEDICATED_PAD | CUTANEOUS | Status: DC | PRN

## 2024-02-05 MED ORDER — MEASLES, MUMPS & RUBELLA VAC ~~LOC~~ SUSR: 0.5000 mL | Freq: Once | SUBCUTANEOUS | Status: DC

## 2024-02-05 MED ORDER — COCONUT OIL OIL: 1.0000 | TOPICAL_OIL | Status: DC | PRN

## 2024-02-05 MED ORDER — EPHEDRINE 5 MG/ML INJ: 10.0000 mg | INTRAVENOUS | Status: DC | PRN

## 2024-02-05 MED ORDER — DIPHENHYDRAMINE HCL 25 MG PO CAPS: 25.0000 mg | ORAL_CAPSULE | Freq: Four times a day (QID) | ORAL | Status: DC | PRN

## 2024-02-05 MED ORDER — IBUPROFEN 600 MG PO TABS
ORAL_TABLET | ORAL | Status: AC
  Filled 2024-02-05: qty 1

## 2024-02-05 MED ORDER — TRANEXAMIC ACID-NACL 1000-0.7 MG/100ML-% IV SOLN
INTRAVENOUS | Status: AC
  Administered 2024-02-05: 1000 mg
  Filled 2024-02-05: qty 100

## 2024-02-05 MED ORDER — DIPHENHYDRAMINE HCL 50 MG/ML IJ SOLN: 12.5000 mg | INTRAMUSCULAR | Status: DC | PRN

## 2024-02-05 MED ORDER — TRANEXAMIC ACID-NACL 1000-0.7 MG/100ML-% IV SOLN: 1000.0000 mg | INTRAVENOUS | Status: DC

## 2024-02-05 MED ORDER — FENTANYL-BUPIVACAINE-NACL 0.5-0.125-0.9 MG/250ML-% EP SOLN
12.0000 mL/h | EPIDURAL | Status: DC | PRN
  Administered 2024-02-05: 11 mL/h via EPIDURAL
  Filled 2024-02-05: qty 250

## 2024-02-05 MED ORDER — PHENYLEPHRINE 80 MCG/ML (10ML) SYRINGE FOR IV PUSH (FOR BLOOD PRESSURE SUPPORT): 80.0000 ug | PREFILLED_SYRINGE | INTRAVENOUS | Status: DC | PRN

## 2024-02-05 MED ORDER — METHYLERGONOVINE MALEATE 0.2 MG PO TABS: 0.2000 mg | ORAL_TABLET | ORAL | Status: DC | PRN

## 2024-02-05 MED ORDER — ONDANSETRON HCL 4 MG PO TABS
4.0000 mg | ORAL_TABLET | ORAL | Status: DC | PRN
  Administered 2024-02-07: 4 mg via ORAL
  Filled 2024-02-05: qty 1

## 2024-02-05 MED ORDER — OXYCODONE HCL 5 MG PO TABS
5.0000 mg | ORAL_TABLET | Freq: Once | ORAL | Status: AC
  Administered 2024-02-05: 5 mg via ORAL
  Filled 2024-02-05: qty 1

## 2024-02-05 MED ORDER — DIBUCAINE (PERIANAL) 1 % EX OINT: 1.0000 | TOPICAL_OINTMENT | CUTANEOUS | Status: DC | PRN

## 2024-02-05 MED ORDER — LACTATED RINGERS IV SOLN
500.0000 mL | Freq: Once | INTRAVENOUS | Status: AC
  Administered 2024-02-05: 500 mL via INTRAVENOUS

## 2024-02-05 MED ORDER — FERROUS SULFATE 325 (65 FE) MG PO TABS
325.0000 mg | ORAL_TABLET | ORAL | Status: DC
  Administered 2024-02-05 – 2024-02-07 (×2): 325 mg via ORAL
  Filled 2024-02-05 (×2): qty 1

## 2024-02-05 MED ORDER — PRENATAL MULTIVITAMIN CH
1.0000 | ORAL_TABLET | Freq: Every day | ORAL | Status: DC
  Administered 2024-02-05 – 2024-02-07 (×3): 1 via ORAL
  Filled 2024-02-05 (×3): qty 1

## 2024-02-05 MED ORDER — SENNOSIDES-DOCUSATE SODIUM 8.6-50 MG PO TABS
2.0000 | ORAL_TABLET | ORAL | Status: DC
  Administered 2024-02-05 – 2024-02-07 (×3): 2 via ORAL
  Filled 2024-02-05 (×4): qty 2

## 2024-02-05 MED ORDER — BISACODYL 10 MG RE SUPP: 10.0000 mg | Freq: Every day | RECTAL | Status: DC | PRN

## 2024-02-05 MED ORDER — LIDOCAINE HCL (PF) 1 % IJ SOLN
INTRAMUSCULAR | Status: DC | PRN
Start: 2024-02-05 — End: 2024-02-05
  Administered 2024-02-05: 5 mL via EPIDURAL
  Administered 2024-02-05: 4 mL via EPIDURAL

## 2024-02-05 MED ORDER — METHYLERGONOVINE MALEATE 0.2 MG/ML IJ SOLN: 0.2000 mg | INTRAMUSCULAR | Status: DC | PRN

## 2024-02-05 MED ORDER — IBUPROFEN 600 MG PO TABS
600.0000 mg | ORAL_TABLET | Freq: Four times a day (QID) | ORAL | Status: DC
  Administered 2024-02-05 – 2024-02-07 (×8): 600 mg via ORAL
  Filled 2024-02-05 (×9): qty 1

## 2024-02-05 MED ORDER — ONDANSETRON HCL 4 MG/2ML IJ SOLN: 4.0000 mg | INTRAMUSCULAR | Status: DC | PRN

## 2024-02-05 MED ORDER — OXYCODONE HCL 5 MG PO TABS
5.0000 mg | ORAL_TABLET | Freq: Four times a day (QID) | ORAL | Status: DC | PRN
  Administered 2024-02-05 – 2024-02-07 (×6): 5 mg via ORAL
  Filled 2024-02-05 (×6): qty 1

## 2024-02-05 MED ORDER — SIMETHICONE 80 MG PO CHEW: 80.0000 mg | CHEWABLE_TABLET | ORAL | Status: DC | PRN

## 2024-02-05 NOTE — Anesthesia Preprocedure Evaluation (Signed)
 Anesthesia Evaluation  Patient identified by MRN, date of birth, ID band Patient awake    Reviewed: Allergy & Precautions, Patient's Chart, lab work & pertinent test results  Airway Mallampati: II       Dental no notable dental hx.    Pulmonary neg pulmonary ROS   Pulmonary exam normal        Cardiovascular negative cardio ROS Normal cardiovascular exam Rhythm:Regular     Neuro/Psych negative neurological ROS  negative psych ROS   GI/Hepatic Neg liver ROS,GERD  ,,  Endo/Other  negative endocrine ROS    Renal/GU negative Renal ROS  negative genitourinary   Musculoskeletal negative musculoskeletal ROS (+)    Abdominal Normal abdominal exam  (+)   Peds  Hematology  (+) Blood dyscrasia, anemia   Anesthesia Other Findings   Reproductive/Obstetrics (+) Pregnancy                              Anesthesia Physical Anesthesia Plan  ASA: 2  Anesthesia Plan: Epidural   Post-op Pain Management:    Induction:   PONV Risk Score and Plan: Treatment may vary due to age or medical condition  Airway Management Planned: Natural Airway  Additional Equipment:   Intra-op Plan:   Post-operative Plan:   Informed Consent: I have reviewed the patients History and Physical, chart, labs and discussed the procedure including the risks, benefits and alternatives for the proposed anesthesia with the patient or authorized representative who has indicated his/her understanding and acceptance.       Plan Discussed with: Anesthesiologist  Anesthesia Plan Comments:          Anesthesia Quick Evaluation

## 2024-02-05 NOTE — Discharge Summary (Signed)
 Postpartum Discharge Summary      Patient Name: Amanda Davies DOB: 11/27/2000 MRN: [id number]  Date of admission: 02/04/2024 Delivery date:02/05/2024 Delivering provider: NEWTON MERING Date of discharge: 02/07/2024  Admitting diagnosis: IUGR (intrauterine growth restriction) affecting care of mother [O36.5990] Intrauterine pregnancy: [redacted]w[redacted]d     Secondary diagnosis:  Principal Problem:   IUGR (intrauterine growth restriction) affecting care of mother Active Problems:   Vaginal delivery  Additional problems: None    Discharge diagnosis: Term Pregnancy Delivered                                              Post partum procedures:depo  Augmentation: none Complications: None  Hospital course: Onset of Labor With Vaginal Delivery      23 y.o. yo [id number] at [redacted]w[redacted]d was admitted in Active Labor on 02/04/2024. Labor course was complicated by nothing  Membrane Rupture Time/Date:  ,   Delivery Method:Vaginal, Spontaneous Operative Delivery:N/A Episiotomy: None Lacerations:  None Patient had a postpartum course complicated by  none.  She is ambulating, tolerating a regular diet, passing flatus, and urinating well. Patient is discharged home in stable condition on 02/07/24.  Newborn Data: Birth date:02/05/2024 Birth time:2:17 AM Gender:Female Living status:Living Apgars:9 ,9  Weight:2240 g  Magnesium  Sulfate received: No BMZ received: No Rhophylac:N/A MMR:N/A T-DaP:Given prenatally Flu: No RSV Vaccine received: No Transfusion:No  Immunizations received: Immunization History  Administered Date(s) Administered   Tdap 12/31/2015, 10/17/2022, 02/02/2024    Physical exam  Vitals:   02/05/24 2034 02/06/24 0554 02/06/24 2115 02/07/24 0453  BP: 110/70 111/84 112/77 106/74  Pulse: 80 100 70 77  Resp: 18 18 18 18   Temp: 98.1 F (36.7 C) 97.8 F (36.6 C) 97.9 F (36.6 C) 98.2 F (36.8 C)  TempSrc: Oral Oral Oral Oral  SpO2: 100% 99% 100% 100%  Weight:       Height:       General: alert, cooperative, and no distress Lochia: appropriate Uterine Fundus: firm Incision: N/A DVT Evaluation: No evidence of DVT seen on physical exam. Labs: Lab Results  Component Value Date   WBC 9.9 02/04/2024   HGB 10.7 (L) 02/04/2024   HCT 34.5 (L) 02/04/2024   MCV 74.4 (L) 02/04/2024   PLT 235 02/04/2024      Latest Ref Rng & Units 01/22/2024    3:30 PM  CMP  Glucose 70 - 99 mg/dL 94   BUN 6 - 20 mg/dL <5   Creatinine 9.55 - 1.00 mg/dL 9.33   Sodium 864 - 854 mmol/L 135   Potassium 3.5 - 5.1 mmol/L 3.0   Chloride 98 - 111 mmol/L 100   CO2 22 - 32 mmol/L 26   Calcium  8.9 - 10.3 mg/dL 9.0   Total Protein 6.5 - 8.1 g/dL 6.4   Total Bilirubin 0.0 - 1.2 mg/dL 0.9   Alkaline Phos 38 - 126 U/L 141   AST 15 - 41 U/L 15   ALT 0 - 44 U/L 9    Edinburgh Score:    02/06/2024    5:46 PM  Edinburgh Postnatal Depression Scale Screening Tool  I have been able to laugh and see the funny side of things. 0  I have looked forward with enjoyment to things. 0  I have blamed myself unnecessarily when things went wrong. 0  I have been anxious or worried for no good  reason. 0  I have felt scared or panicky for no good reason. 0  Things have been getting on top of me. 0  I have been so unhappy that I have had difficulty sleeping. 0  I have felt sad or miserable. 0  I have been so unhappy that I have been crying. 0  The thought of harming myself has occurred to me. 0  Edinburgh Postnatal Depression Scale Total 0   Edinburgh Postnatal Depression Scale Total: 0   After visit meds:  Allergies as of 02/07/2024   No Known Allergies      Medication List     STOP taking these medications    acetaminophen  325 MG tablet Commonly known as: Tylenol    aspirin  EC 81 MG tablet   Blood Pressure Kit Devi   docusate sodium  100 MG capsule Commonly known as: COLACE   famotidine  20 MG tablet Commonly known as: Pepcid    Gojji Weight Scale Misc   metoCLOPramide   10 MG tablet Commonly known as: REGLAN    ondansetron  4 MG disintegrating tablet Commonly known as: ZOFRAN -ODT   ondansetron  4 MG tablet Commonly known as: Zofran    potassium chloride  SA 20 MEQ tablet Commonly known as: KLOR-CON  M   scopolamine  1 MG/3DAYS Commonly known as: TRANSDERM-SCOP       TAKE these medications    ferrous sulfate  325 (65 FE) MG tablet Take 1 tablet (325 mg total) by mouth every other day.   ibuprofen  600 MG tablet Commonly known as: ADVIL  Take 1 tablet (600 mg total) by mouth every 6 (six) hours.   PrePLUS 27-1 MG Tabs Take 1 tablet by mouth daily.         Discharge home in stable condition Infant Feeding: Bottle and Breast Infant Disposition:home with mother Discharge instruction: per After Visit Summary and Postpartum booklet. Activity: Advance as tolerated. Pelvic rest for 6 weeks.  Diet: routine diet Future Appointments: Future Appointments  Date Time Provider Department Center  03/07/2024  1:30 PM Rudy Carlin LABOR, MD [facility] None   Follow up Visit:   Please schedule this patient for a Virtual postpartum visit in 4 weeks with the following provider: Any provider. Additional Postpartum F/U:  Low risk pregnancy complicated by:  Delivery mode:  Vaginal, Spontaneous Anticipated Birth Control:  PP Depo given   02/07/2024 Camie LABOR Rote, CNM

## 2024-02-05 NOTE — Lactation Note (Signed)
 This note was copied from a baby's chart. Lactation Consultation Note  Patient Name: Amanda Davies Unijb'd Date: 02/05/2024 Age:23 years Reason for consult: Initial assessment;Early term 37-38.6wks;Infant < 5lbs  P7. Experienced BF mom has had all of her children less than 6 lbs. This baby is the smallest baby. Mom BF them all and only stopped about 4 months ago. Reviewed Plan of Care feeding card about supplementing. Instructed not to BF longer than 10 min. For now until baby starts gaining weight to help conserve energy. Mom stated understanding. Pace feeding demonstrated. Baby took formula well. Suggested if baby only takes part of what is needed of the formula, let baby rest 20-30 minutes then try to give more formula before hour of bottle expires. Mom stated understanding. Mom encouraged to feed baby 8-12 times/24 hours and with feeding cues.  Mom shown how to use DEBP & how to disassemble, clean, & reassemble parts. Pump every 3 hrs. Feeding Plan  do not BF longer than 10 min. Supplement w/BM/formula pump  Call for assistance or questions as needed. Maternal Data Has patient been taught Hand Expression?: Yes Does the patient have breastfeeding experience prior to this delivery?: Yes How long did the patient breastfeed?: 1st child now 8 for 11 months, 2nd-10 months, 3rd-9 months, 4th-10 months, 5th-10 months now 3 yrs, 6th-1 yr now 1 yr and 25 months old.  Feeding Nipple Type: Nfant Slow Flow (purple)  LATCH Score       Type of Nipple: Everted at rest and after stimulation  Comfort (Breast/Nipple): Soft / non-tender         Lactation Tools Discussed/Used Tools: Pump;Flanges Flange Size: 24 Breast pump type: Double-Electric Breast Pump Pump Education: Setup, frequency, and cleaning;Milk Storage Reason for Pumping: less than 5 lbs Pumping frequency: q 3hr  Interventions Interventions: Breast feeding basics reviewed;Skin to skin;Breast massage;Hand  express;DEBP;Education;Pace feeding;LC Services brochure;LPT handout/interventions;Infant Driven Feeding Algorithm education  Discharge Pump: Referral sent for [employer] [facility] Program: Yes  Consult Status Consult Status: Follow-up Date: 02/06/24 Follow-up type: In-patient    Amanda Davies 02/05/2024, 6:29 AM

## 2024-02-05 NOTE — Anesthesia Procedure Notes (Signed)
 Epidural Patient location during procedure: OB Start time: 02/05/2024 1:22 AM End time: 02/05/2024 1:31 AM  Staffing Anesthesiologist: Jerrye Sharper, MD  Preanesthetic Checklist Completed: patient identified, IV checked, site marked, risks and benefits discussed, surgical consent, monitors and equipment checked, pre-op evaluation and timeout performed  Epidural Patient position: sitting Prep: DuraPrep and site prepped and draped Patient monitoring: continuous pulse ox and blood pressure Approach: midline Location: L3-L4 Injection technique: LOR air  Needle:  Needle type: Tuohy  Needle gauge: 17 G Needle length: 9 cm and 9 Needle insertion depth: 5 cm Catheter type: closed end flexible Catheter size: 19 Gauge Catheter at skin depth: 10 cm Test dose: negative and Other  Assessment Events: blood not aspirated, no cerebrospinal fluid, injection not painful, no injection resistance, no paresthesia and negative IV test  Additional Notes Patient identified. Risks and benefits discussed including failed block, incomplete  Pain control, post dural puncture headache, nerve damage, paralysis, blood pressure Changes, nausea, vomiting, reactions to medications-both toxic and allergic and post Partum back pain. All questions were answered. Patient expressed understanding and wished to proceed. Sterile technique was used throughout procedure. Epidural site was Dressed with sterile barrier dressing. No paresthesias, signs of intravascular injection Or signs of intrathecal spread were encountered.  Patient was more comfortable after the epidural was dosed. Please see RN's note for documentation of vital signs and FHR which are stable.

## 2024-02-05 NOTE — Anesthesia Postprocedure Evaluation (Signed)
 Anesthesia Post Note  Patient: Brookie Kirks  Procedure(s) Performed: AN AD HOC LABOR EPIDURAL     Patient location during evaluation: Mother Baby Anesthesia Type: Epidural Level of consciousness: awake and alert Pain management: pain level controlled Vital Signs Assessment: post-procedure vital signs reviewed and stable Respiratory status: spontaneous breathing, nonlabored ventilation and respiratory function stable Cardiovascular status: stable Postop Assessment: no headache, no backache, epidural receding, no apparent nausea or vomiting, patient able to bend at knees, able to ambulate and adequate PO intake Anesthetic complications: no   No notable events documented.  Last Vitals:  Vitals:   02/05/24 0550 02/05/24 0909  BP: 100/68 111/78  Pulse: 86 74  Resp: 17 16  Temp: 36.8 C 36.7 C  SpO2: 100% 100%    Last Pain:  Vitals:   02/05/24 0918  TempSrc:   PainSc: 3    Pain Goal: Patients Stated Pain Goal: 0 (02/04/24 2124)              Epidural/Spinal Function Cutaneous sensation: Normal sensation (02/05/24 0909), Patient able to flex knees: Yes (02/05/24 0909), Patient able to lift hips off bed: Yes (02/05/24 0909), Back pain beyond tenderness at insertion site: No (02/05/24 0909), Progressively worsening motor and/or sensory loss: No (02/05/24 0909), Bowel and/or bladder incontinence post epidural: No (02/05/24 0909)  Thomos Knock Hristova

## 2024-02-05 NOTE — Lactation Note (Signed)
 This note was copied from a baby's chart. Lactation Consultation Note  Patient Name: Amanda Davies Date: 02/05/2024 Age:25 hours Reason for consult: Follow-up assessment;Early term 37-38.6wks;Infant < 5lbs  P7- Infant was born at 37w 3d GA weighing 2240g. MOB's feeding plan is to offer both breast milk and formula. MOB has been previously set up with the hospital DEBP and the low birth weight guidelines. MOB's STORK pump came in, so LC provided it to her. MOB denies pumping since being set up with the pump because she has been in pain. LC reviewed the importance of frequent/early breast stimulation. LC reviewed different ways to reduce the pain from feeding/pumping; urinate before latching/pumping, take offered pain relievers, use a heat pack. MOB verbalized understanding. MOB reports that infant feeds well so far and has no concerns. LC encouraged MOB to call for infant's feeding for a latch assessment. LC reviewed infant low birth weight volume guidelines. LC encouraged MOB to call for further assistance as needed.  Maternal Data Has patient been taught Hand Expression?: No Does the patient have breastfeeding experience prior to this delivery?: Yes How long did the patient breastfeed?: first child 11 months, second child 10 months, third child 9 months, fourth child 10 months, fifth child 10 months and sixth child 1 year  Feeding Mother's Current Feeding Choice: Breast Milk and Formula  Lactation Tools Discussed/Used Tools: Pump;Flanges Breast pump type: Double-Electric Breast Pump;Manual Pump Education: Setup, frequency, and cleaning;Milk Storage Reason for Pumping: low birth weight infant Pumping frequency: 15-20 min every 3 hrs  Interventions Interventions: Breast feeding basics reviewed;Hand pump;DEBP;Education;Pace feeding;LC Services brochure;LPT handout/interventions  Discharge Discharge Education: Engorgement and breast care;Warning signs for feeding baby Pump:  Received Stork Pump  Consult Status Consult Status: Follow-up Date: 02/06/24 Follow-up type: In-patient    Recardo Hoit BS, IBCLC 02/05/2024, 2:34 PM

## 2024-02-05 NOTE — H&P (Signed)
 Amanda Davies is a 23 y.o. female [phone] with IUP at [redacted]w[redacted]d by 19 week US  presenting for contractions/IOL for FGR.  She reports positive fetal movement. She denies leakage of fluid or vaginal bleeding.  Prenatal History/Complications:  Term SVD X 4 PTD X235.1/35.5 Preeclampsia    PNC at [facility], limited care (no care June-August  Dx w/severe FGR <1% on 10/1.  Began having contractions last night, came to [facility] for evaluation.    Past Medical History: Past Medical History:  Diagnosis Date   Asthma    when younger   Chlamydia    GBS bacteriuria 04/25/2019   Insufficient prenatal care in third trimester 07/21/2019   PROM (premature rupture of membranes) 09/10/2019    Past Surgical History: Past Surgical History:  Procedure Laterality Date   NO PAST SURGERIES      Obstetrical History: OB History     Gravida  8   Para  6   Term  4   Preterm  2   AB  1   Living  6      SAB  1   IAB      Ectopic      Multiple  0   Live Births  6            Social History: Social History   Socioeconomic History   Marital status: Single    Spouse name: Not on file   Number of children: 2   Years of education: 12   Highest education level: High school graduate  Occupational History   Not on file  Tobacco Use   Smoking status: Never   Smokeless tobacco: Never  Vaping Use   Vaping status: Never Used  Substance and Sexual Activity   Alcohol use: Not Currently   Drug use: No   Sexual activity: Not Currently    Partners: Male    Birth control/protection: None  Other Topics Concern   Not on file  Social History Narrative   Not on file   Social Drivers of Health   Financial Resource Strain: Not on file  Food Insecurity: No Food Insecurity (02/04/2024)   Hunger Vital Sign    Worried About Running Out of Food in the Last Year: Never true    Ran Out of Food in the Last Year: Never true  Transportation Needs: No Transportation Needs (02/04/2024)   PRAPARE -  Administrator, Civil Service (Medical): No    Lack of Transportation (Non-Medical): No  Physical Activity: Not on file  Stress: Not on file  Social Connections: Not on file    Family History: Family History  Problem Relation Age of Onset   Anxiety disorder Mother    Asthma Sister    Asthma Brother     Allergies: No Known Allergies  Medications Prior to Admission  Medication Sig Dispense Refill Last Dose/Taking   acetaminophen  (TYLENOL ) 325 MG tablet Take 2 tablets (650 mg total) by mouth every 4 (four) hours as needed (for pain scale < 4). 100 tablet 0 Past Month   metoCLOPramide  (REGLAN ) 10 MG tablet Take 1 tablet (10 mg total) by mouth every 6 (six) hours as needed for nausea. 30 tablet 0 Past Week   ondansetron  (ZOFRAN -ODT) 4 MG disintegrating tablet Take 1 tablet (4 mg total) by mouth every 8 (eight) hours as needed for nausea or vomiting. 30 tablet 0 02/03/2024   ondansetron  (ZOFRAN -ODT) 4 MG disintegrating tablet Take 1 tablet (4 mg total) by mouth every 8 (eight)  hours as needed for nausea or vomiting. 15 tablet 0 02/03/2024   aspirin  EC 81 MG tablet Take 1 tablet (81 mg total) by mouth daily. Take after 12 weeks for prevention of preeclampsia later in pregnancy (Patient not taking: Reported on 01/20/2024) 300 tablet 2    Blood Pressure Monitoring (BLOOD PRESSURE KIT) DEVI 1 kit by Does not apply route once a week. 1 each 0    docusate sodium  (COLACE) 100 MG capsule Take 1 capsule (100 mg total) by mouth 2 (two) times daily. (Patient not taking: Reported on 02/02/2024) 60 capsule 0    famotidine  (PEPCID ) 20 MG tablet Take 1 tablet (20 mg total) by mouth 2 (two) times daily. (Patient not taking: Reported on 02/02/2024) 60 tablet 0    ferrous sulfate  325 (65 FE) MG tablet Take 1 tablet (325 mg total) by mouth every other day. 30 tablet 0    Misc. Devices (GOJJI WEIGHT SCALE) MISC 1 Device by Does not apply route every 30 (thirty) days. 1 each 0    ondansetron  (ZOFRAN ) 4 MG  tablet Take 2 tablets (8 mg total) by mouth 2 (two) times daily. (Patient not taking: Reported on 02/02/2024) 20 tablet 0    potassium chloride  SA (KLOR-CON  M) 20 MEQ tablet Take 2 tablets (40 mEq total) by mouth daily for 3 days. 6 tablet 0    Prenatal Vit-Fe Fumarate-FA (PREPLUS) 27-1 MG TABS Take 1 tablet by mouth daily. (Patient not taking: Reported on 02/02/2024) 30 tablet 13    scopolamine  (TRANSDERM-SCOP) 1 MG/3DAYS Place 1 patch (1.5 mg total) onto the skin every 3 (three) days. (Patient not taking: Reported on 02/02/2024) 10 patch 1     Review of Systems   Constitutional: Negative for fever and chills Eyes: Negative for visual disturbances Respiratory: Negative for shortness of breath, dyspnea Cardiovascular: Negative for chest pain or palpitations  Gastrointestinal: Negative for vomiting, diarrhea and constipation.  POSITIVE for abdominal pain (contractions) Genitourinary: Negative for dysuria and urgency Musculoskeletal: Negative for back pain, joint pain, myalgias  Neurological: Negative for dizziness and headaches  Blood pressure 111/71, pulse 83, temperature 98 F (36.7 C), temperature source Oral, resp. rate 17, height 5' 2 (1.575 m), weight 67.6 kg, SpO2 100%, unknown if currently breastfeeding. General appearance: alert, cooperative, and no distress Lungs: normal respiratory effort Heart: regular rate and rhythm Abdomen: soft, non-tender; bowel sounds normal Extremities: Homans sign is negative, no sign of DVT DTR's 2+ Presentation: cephalic Fetal monitoring  Baseline: 135 bpm, Variability: Good {> 6 bpm), Accelerations: Reactive, and Decelerations: Absent Uterine activity  irregular, 2-5 m inutes Dilation: 6 Effacement (%): 60 Station: -1 Exam by:: EMERSON Stanley, RNC   Prenatal labs: ABO, Rh: --/--/O POS (10/02 2311) Antibody: NEG (10/02 2311) Rubella: 12.90 (05/29 1612) RPR: Non Reactive (09/17 1448)  HBsAg: Negative (05/29 1612)  HIV: Non Reactive (09/17 1448)   GBS: POSITIVE/-- (10/02 2334)    NURSING  PROVIDER  Office Location [facility] Dating by   [facility] Model Traditional Anatomy U/S   Initiated care at  wks                Language  English              LAB RESULTS   Support Person  Genetics NIPS: LR XX AFP:     NT/IT (FT only)     Carrier Screen Horizon:   Rhogam N/A A1C/GTT Early HgbA1C: 5.2% (10/01/23) Third trimester 2 hr GTT:   Flu Vaccine declined  TDaP Vaccine   Blood Type O positive (10/01/23)  RSV Vaccine  Antibody Negative (10/01/23)  COVID Vaccine  Rubella  12.9 (10/01/23)  Feeding Plan Breast and bottle RPR Nonreactive (10/01/23)  Contraception  HBsAg  Negative (10/01/23)  Circumcision N/a HIV Nonreactive (10/01/23)  Pediatrician  Planning on switching HCVAb  Nonreactive (10/01/23)  Prenatal Classes     BTL Consent  Pap Diagnosis  Date Value Ref Range Status  07/17/2020   Final   - Negative for intraepithelial lesion or malignancy (NILM)    BTL Pre-payment  GC/CT Initial:   36wks:    VBAC Consent  GBS   For PCN allergy, check sensitivities   BRx Optimized? [ ]  yes   [ ]  no    DME Rx [ ]  BP cuff [ ]  Weight Scale Waterbirth  [ ]  Class [ ]  Consent [ ]  CNM visit  PHQ9 & GAD7 [  ] new OB [  ] 28 weeks  [  ] 36 weeks Induction  [ ]  Orders Entered [ ] Foley Y/N       Prenatal Transfer Tool  Maternal Diabetes: unknown Genetic Screening: Normal Maternal Ultrasounds/Referrals: IUGR Fetal Ultrasounds or other Referrals:  Referred to Materal Fetal Medicine  Maternal Substance Abuse:  No Significant Maternal Medications:  None Significant Maternal Lab Results: Group B Strep positive  Results for orders placed or performed during the hospital encounter of 02/04/24 (from the past 24 hours)  Type and screen   Collection Time: 02/04/24 11:11 PM  Result Value Ref Range   ABO/RH(D) O POS    Antibody Screen NEG    Sample Expiration      02/07/2024,2359 Performed at [facility], [address], [place], [place] [address]    CBC   Collection Time: 02/04/24 11:14 PM  Result Value Ref Range   WBC 9.9 4.0 - 10.5 K/uL   RBC 4.64 3.87 - 5.11 MIL/uL   Hemoglobin 10.7 (L) 12.0 - 15.0 g/dL   HCT 65.4 (L) 63.9 - 53.9 %   MCV 74.4 (L) 80.0 - 100.0 fL   MCH 23.1 (L) 26.0 - 34.0 pg   MCHC 31.0 30.0 - 36.0 g/dL   RDW 85.1 88.4 - 84.4 %   Platelets 235 150 - 400 K/uL   nRBC 0.0 0.0 - 0.2 %  Group B strep by PCR   Collection Time: 02/04/24 11:34 PM   Specimen: Vaginal/Rectal; Genital  Result Value Ref Range   Group B strep by PCR POSITIVE (A) PRESUMPTIVE NEGATIVE  POCT fern test   Collection Time: 02/05/24 12:43 AM  Result Value Ref Range   POCT Fern Test Negative = intact amniotic membranes     Assessment: Amanda Davies is a 23 y.o. H1E5783 with an IUP at [redacted]w[redacted]d presenting for possible labor vs advanced cx dialtion  Plan: #Labor: expectant management until adequate GBS ppx, consider AROM for augmentation if needed #Pain:  Per request #FWB Cat 1 #ID: GBS: PCN  #MOF:  breast and bottle #MOC: discussed    Cathlean Ely 02/05/2024, 1:55 AM

## 2024-02-06 NOTE — Progress Notes (Signed)
 POSTPARTUM PROGRESS NOTE  Post Partum Day #1  Subjective:  Amanda Davies is a 23 y.o. H1E4782 s/p SVD at [redacted]w[redacted]d.  She reports she is doing well. No acute events overnight. She denies any problems with ambulating, voiding or po intake. Denies nausea or vomiting.  Pain is well controlled.  Lochia is minimal.  Objective: Blood pressure 111/84, pulse 100, temperature 97.8 F (36.6 C), temperature source Oral, resp. rate 18, height 5' 2 (1.575 m), weight 67.6 kg, SpO2 99%, unknown if currently breastfeeding.  Physical Exam:  General: alert, cooperative and no distress Chest: no respiratory distress Heart:regular rate, distal pulses intact Abdomen: soft, nontender,  Uterine Fundus: firm, appropriately tender DVT Evaluation: No calf swelling or tenderness Extremities: No edema Skin: warm, dry  Recent Labs    02/04/24 2314  HGB 10.7*  HCT 34.5*    Assessment/Plan: Amanda Davies is a 23 y.o. H1E4782 s/p SVD at [redacted]w[redacted]d   PPD#1 - Doing well  Routine postpartum care  Contraception: Depo Feeding: breast/formula Dispo: Plan for discharge tomorrow.   LOS: 2 days   Barkley Angles, MD OB Fellow, Faculty Practice [facility], Center for [employer]

## 2024-02-06 NOTE — Lactation Note (Addendum)
 This note was copied from a baby's chart. Lactation Consultation Note  Patient Name: Amanda Davies Unijb'd Date: 02/06/2024 Age:25 hours Reason for consult: Follow-up assessment;Late-preterm 34-36.6wks;Infant < 5lbs.  Per MOB, infant is breastfeeding well recently breastfeed for 15 minutes and afterwards was supplemented with 15 mls of 22 kcal formula at 1310 pm, LC did not observe infant's latch.LC discussed pumping MOB was open to using the DEBP, MOB was still expressing colostrum as LC left the room had expressed 11 mls. MOB has received her Spectra  2 DEBP.   MOB knows that her EBM is safe for 4 hours whereas RTF formula once open is only safe for 1 hour at room temperature.  Current feeding plan: Day 2 MOB will continue to follow LBW/ LPTI feeding guidelines. 1- MOB will continue to breastfeed infant every 3 hours and limit breast and bottle feeding to total of 30 minutes or less. 2- After latching infant at the breast for 15 minutes or less, MOB will supplement infant each feeding with any EBM first and then 22 kcal formula and knows to offer (18-21 mls) per feeding on day 2 of life. 3- MOB will continue to use the DEBP every 3 hours for 15 minutes on initial setting.   Maternal Data    Feeding Mother's Current Feeding Choice: Breast Milk  LATCH Score                    Lactation Tools Discussed/Used Tools: Pump;Flanges Flange Size: 24 Breast pump type: Double-Electric Breast Pump Pump Education: Setup, frequency, and cleaning;Milk Storage Reason for Pumping: Infant is ETI and less than 5 lbs Pumping frequency: MOB will continue to pump every 3 hours for 15 minutes Pumped volume: 11 mL  Interventions Interventions: Education;Guidelines for Milk Supply and Pumping Schedule Handout;CDC milk storage guidelines;CDC Guidelines for Breast Pump Cleaning;DEBP  Discharge Pump: Received Stork Pump  Consult Status Consult Status: Follow-up Date: 02/07/24 Follow-up  type: In-patient    Amanda Davies 02/06/2024, 2:42 PM

## 2024-02-07 ENCOUNTER — Inpatient Hospital Stay (HOSPITAL_COMMUNITY)

## 2024-02-07 ENCOUNTER — Inpatient Hospital Stay (HOSPITAL_COMMUNITY): Admission: RE | Admit: 2024-02-07 | Source: Home / Self Care | Admitting: Obstetrics & Gynecology

## 2024-02-07 MED ORDER — IBUPROFEN 600 MG PO TABS: 600.0000 mg | ORAL_TABLET | Freq: Four times a day (QID) | ORAL | 0 refills | Status: AC

## 2024-02-07 MED ORDER — OXYCODONE HCL 5 MG PO TABS: 5.0000 mg | ORAL_TABLET | Freq: Four times a day (QID) | ORAL | 0 refills | Status: AC | PRN

## 2024-02-07 NOTE — Lactation Note (Signed)
 This note was copied from a baby's chart. Lactation Consultation Note  Patient Name: Amanda Davies Date: 02/07/2024 Age:25 hours Reason for consult: Follow-up assessment;Infant < 5lbs (infant had change in weight loss -3.80 to -3.13% in past 24 hours.)  Per MOB, infant has been feeding well at the breast, she has been latching infant every feeding for 15 minutes and afterwards alternating supplementation with EBM or using the 22 kcal formula. LC did not observe latch, infant recently breastfeed for 15 minutes and afterwards was given 25 mls of EBM at 08:30 am. MOB informed LC she is now pumping 60 mls of colostrum per pumping session. MOB has Her STORK Spectra  2 DEBP for home use and will continue to supplement infant with her own EBM for each feeding.  Current Feeding Plans: 1.MOB Plans to continue to follow the LPTI/LBW infant feeding guidelines until infant has regain her birth weight as well as [redacted] weeks gestation   2.MOB will continue to feed infant every 3 hours or sooner and knows to increase infant's supplementation as infant ages. 3. MOB will continue to use her Personal DEBP and supplement infant after each feeding.   Discharge education Plan: 1- LC discussed engorgement treatment and plan. 2- LC discussed warning signs of dehydration in infant. 3- LC discussed how to knows if breastfeeding is going well. 4- LC reinforced [employer] [facility] [employer], Breastfeeding Support Group and [facility].   Maternal Data    Feeding Mother's Current Feeding Choice: Breast Milk and Formula Nipple Type: Extra Slow Flow  LATCH Score                    Lactation Tools Discussed/Used    Interventions Interventions: DEBP;LPT handout/interventions  Discharge Discharge Education: Engorgement and breast care;Warning signs for feeding baby Pump: Received Stork Pump  Consult Status Consult Status: Complete Date: 02/08/24 Follow-up type:  In-patient    Amanda Davies 02/07/2024, 10:11 AM

## 2024-02-07 NOTE — Progress Notes (Signed)
 CSW was consulted due to [facility] stating she is in need of a car seat and pack n play for infant. CSW met with MOB at bedside to assess for needs and provide support. When CSW entered room, MOB was observed sitting in bed holding infant. FOB was present sitting nearby. CSW introduced self and explained reason for visit. MOB confirmed that she is in need of a car seat and pack n play for infant. CSW explained that unfortunately, the hospital does not have any car seats in stock. MOB states she is trying to get in touch with her mother who she is hopeful can help her purchase a car seat. CSW provided a pack n play. CSW inquired about additional resource needs. MOB expressed interest in a [facility] referral and states she has 1 pack of diapers but will need more diapers and wipes for infant. CSW placed [facility] referral and assisted MOB in scheduling a [facility] appointment for infant essentials for 03/07/24 at 1:15pm. MOB expressed appreciation and denied additional resource needs at this time.   Signed,  Sharyne LOIS Roulette, MSW, LCSWA, LCASA 02/07/2024 4:02 PM

## 2024-02-08 LAB — SURGICAL PATHOLOGY

## 2024-02-09 ENCOUNTER — Encounter: Admitting: Obstetrics

## 2024-02-16 ENCOUNTER — Telehealth (HOSPITAL_COMMUNITY): Payer: Self-pay

## 2024-02-16 NOTE — Telephone Encounter (Signed)
 02/16/2024 1526  Name: Amanda Davies MRN: [id number] DOB: [birth year]  Reason for Call:  Transition of Care Hospital Discharge Call  Contact Status: Patient Contact Status: Complete  Language assistant needed:          Follow-Up Questions: Do You Have Any Concerns About Your Health As You Heal From Delivery?: No Do You Have Any Concerns About Your Infants Health?: No  Edinburgh Postnatal Depression Scale:  In the Past 7 Days: I have been able to laugh and see the funny side of things.: As much as I always could I have looked forward with enjoyment to things.: As much as I ever did I have blamed myself unnecessarily when things went wrong.: No, never I have been anxious or worried for no good reason.: No, not at all I have felt scared or panicky for no good reason.: No, not at all Things have been getting on top of me.: No, I have been coping as well as ever I have been so unhappy that I have had difficulty sleeping.: Not at all I have felt sad or miserable.: No, not at all I have been so unhappy that I have been crying.: No, never The thought of harming myself has occurred to me.: Never [place] Postnatal Depression Scale Total: 0  PHQ2-9 Depression Scale:     Discharge Follow-up: Edinburgh score requires follow up?: No Patient was advised of the following resources:: Breastfeeding Support Group, Support Group  Post-discharge interventions: Reviewed Newborn Safe Sleep Practices  Signature  Rosaline Deretha PEAK

## 2024-02-21 DIAGNOSIS — M79604 Pain in right leg: Secondary | ICD-10-CM | POA: Diagnosis not present

## 2024-02-21 DIAGNOSIS — S0990XA Unspecified injury of head, initial encounter: Secondary | ICD-10-CM | POA: Diagnosis not present

## 2024-02-21 DIAGNOSIS — M25571 Pain in right ankle and joints of right foot: Secondary | ICD-10-CM | POA: Diagnosis not present

## 2024-02-21 DIAGNOSIS — S8001XA Contusion of right knee, initial encounter: Secondary | ICD-10-CM | POA: Diagnosis not present

## 2024-02-21 DIAGNOSIS — R519 Headache, unspecified: Secondary | ICD-10-CM | POA: Diagnosis not present

## 2024-03-07 ENCOUNTER — Encounter: Payer: Self-pay | Admitting: Obstetrics

## 2024-03-07 ENCOUNTER — Telehealth (INDEPENDENT_AMBULATORY_CARE_PROVIDER_SITE_OTHER): Admitting: Obstetrics

## 2024-03-07 DIAGNOSIS — Z3042 Encounter for surveillance of injectable contraceptive: Secondary | ICD-10-CM

## 2024-03-07 MED ORDER — MEDROXYPROGESTERONE ACETATE 150 MG/ML IM SUSP: 150.0000 mg | INTRAMUSCULAR | 3 refills | Status: AC

## 2024-03-07 NOTE — Progress Notes (Signed)
 Provider location: Center for [facility]   Patient location: Home  I connected withNAME@ on 03/07/24 at  1:30 PM EST by Mychart Video Encounter and verified that I am speaking with the correct person using two identifiers.       I discussed the limitations, risks, security and privacy concerns of performing an evaluation and management service virtually and the availability of in person appointments. I also discussed with the patient that there may be a patient responsible charge related to this service. The patient expressed understanding and agreed to proceed.  Post Partum Visit Note Subjective:   Amanda Davies is a 23 y.o. [id number] female who presents for a postpartum visit. She is 4 weeks postpartum following a normal spontaneous vaginal delivery.  I have fully reviewed the prenatal and intrapartum course. The delivery was at [redacted]w[redacted]d gestational weeks.  Anesthesia: epidural. Postpartum course has been good. Baby is doing well. Baby is feeding by both breast and bottle - Similac Neosure. Bleeding no bleeding. Bowel function is normal. Bladder function is abnormal: possibly UTI. Patient is not sexually active. Contraception method is Depo-Provera  injections. Postpartum depression screening: negative.   The pregnancy intention screening data noted above was reviewed. Potential methods of contraception were discussed. The patient elected to proceed with No data recorded.   Edinburgh Postnatal Depression Scale - 03/07/24 1333       Edinburgh Postnatal Depression Scale:  In the Past 7 Days   I have been able to laugh and see the funny side of things. 0    I have looked forward with enjoyment to things. 0    I have blamed myself unnecessarily when things went wrong. 0    I have been anxious or worried for no good reason. 0    I have felt scared or panicky for no good reason. 0    Things have been getting on top of me. 0    I have been so unhappy that I have had difficulty  sleeping. 0    I have felt sad or miserable. 0    I have been so unhappy that I have been crying. 0    The thought of harming myself has occurred to me. 0    Edinburgh Postnatal Depression Scale Total 0          The following portions of the patient's history were reviewed and updated as appropriate: allergies, current medications, past family history, past medical history, past social history, past surgical history, and problem list.  Review of Systems A comprehensive review of systems was negative.  Objective:  LMP  (Approximate)     General:  Alert, oriented and cooperative. Patient is in no acute distress.  Respiratory: Normal respiratory effort, no problems with respiration noted  Mental Status: Normal mood and affect. Normal behavior. Normal judgment and thought content.  Rest of physical exam deferred due to type of encounter   Assessment:    1. Postpartum care following vaginal delivery (Primary) - doing well  2. Encounter for surveillance of injectable contraceptive Rx: - medroxyPROGESTERone  (DEPO-PROVERA ) 150 MG/ML injection; Inject 1 mL (150 mg total) into the muscle every 3 (three) months.  Dispense: 1 mL; Refill: 3    Plan:  Essential components of care per ACOG recommendations:  1.  Mood and well being: Patient with negative depression screening today. Reviewed local resources for support.  - Patient does not use tobacco.  - hx of drug use? No    2. Infant  care and feeding:  -Patient currently breastmilk feeding? Yes   If breastmilk feeding discussed return to work and pumping. If needed, patient was provided letter for work to allow for every 2-3 hr pumping breaks, and to be granted a private location to express breastmilk and refrigerated area to store breastmilk. Reviewed importance of draining breast regularly to support lactation. -Social determinants of health (SDOH) reviewed in EPIC. No concerns.  3. Sexuality, contraception and birth spacing - Patient  does not want a pregnancy in the next year.  Desired family size is 7 children.  - Reviewed forms of contraception in tiered fashion. Patient desired Depo-Provera  today.   - Discussed birth spacing of 18 months  4. Sleep and fatigue -Encouraged family/partner/community support of 4 hrs of uninterrupted sleep to help with mood and fatigue  5. Physical Recovery  - Discussed patients delivery vaginally and complications - Patient had a no lacerations, perineal healing reviewed. - Patient has urinary incontinence? No - Patient is safe to resume physical and sexual activity  6.  Health Maintenance - Last pap smear done 2022 and was normal with negative HPV.  7. No Chronic Disease   I have spent a total of 15 minutes of non-face-to-face time, excluding clinical staff time, reviewing notes and preparing to see patient, ordering tests and/or medications, and counseling the patient.    Return in about 6 weeks (around 04/18/2024) for annual exam.   [place], MD, [facility], [facility], [place] 03/07/2024

## 2024-04-20 ENCOUNTER — Ambulatory Visit: Admitting: Obstetrics and Gynecology
